# Patient Record
Sex: Female | Born: 1935 | Race: White | Hispanic: No | Smoking: Never smoker
Health system: Southern US, Community
[De-identification: ages and names within clinical notes are randomized; demographics above are authoritative.]

## PROBLEM LIST (undated history)

## (undated) DIAGNOSIS — K635 Polyp of colon: Secondary | ICD-10-CM

## (undated) DIAGNOSIS — H269 Unspecified cataract: Secondary | ICD-10-CM

## (undated) DIAGNOSIS — I10 Essential (primary) hypertension: Secondary | ICD-10-CM

## (undated) DIAGNOSIS — C801 Malignant (primary) neoplasm, unspecified: Secondary | ICD-10-CM

## (undated) DIAGNOSIS — M810 Age-related osteoporosis without current pathological fracture: Secondary | ICD-10-CM

## (undated) DIAGNOSIS — E785 Hyperlipidemia, unspecified: Secondary | ICD-10-CM

## (undated) DIAGNOSIS — D3613 Benign neoplasm of peripheral nerves and autonomic nervous system of lower limb, including hip: Secondary | ICD-10-CM

## (undated) HISTORY — PX: CATARACT EXTRACTION: SUR2

## (undated) HISTORY — DX: Polyp of colon: K63.5

## (undated) HISTORY — DX: Malignant (primary) neoplasm, unspecified: C80.1

## (undated) HISTORY — PX: MELANOMA EXCISION: SHX5266

## (undated) HISTORY — DX: Age-related osteoporosis without current pathological fracture: M81.0

## (undated) HISTORY — DX: Essential (primary) hypertension: I10

## (undated) HISTORY — DX: Benign neoplasm of peripheral nerves and autonomic nervous system of lower limb, including hip: D36.13

## (undated) HISTORY — DX: Hyperlipidemia, unspecified: E78.5

## (undated) HISTORY — DX: Unspecified cataract: H26.9

---

## 1978-01-18 DIAGNOSIS — C801 Malignant (primary) neoplasm, unspecified: Secondary | ICD-10-CM

## 1978-01-18 HISTORY — DX: Malignant (primary) neoplasm, unspecified: C80.1

## 1999-01-08 ENCOUNTER — Other Ambulatory Visit: Admission: RE | Admit: 1999-01-08 | Discharge: 1999-01-08 | Payer: Self-pay | Admitting: Family Medicine

## 1999-05-13 ENCOUNTER — Encounter: Payer: Self-pay | Admitting: Internal Medicine

## 1999-05-13 ENCOUNTER — Observation Stay (HOSPITAL_COMMUNITY): Admission: EM | Admit: 1999-05-13 | Discharge: 1999-05-14 | Payer: Self-pay | Admitting: Internal Medicine

## 2000-02-03 ENCOUNTER — Ambulatory Visit (HOSPITAL_COMMUNITY): Admission: RE | Admit: 2000-02-03 | Discharge: 2000-02-03 | Payer: Self-pay | Admitting: Specialist

## 2000-11-24 ENCOUNTER — Other Ambulatory Visit: Admission: RE | Admit: 2000-11-24 | Discharge: 2000-11-24 | Payer: Self-pay | Admitting: Family Medicine

## 2001-07-03 ENCOUNTER — Ambulatory Visit (HOSPITAL_COMMUNITY): Admission: RE | Admit: 2001-07-03 | Discharge: 2001-07-03 | Payer: Self-pay | Admitting: Specialist

## 2002-06-21 ENCOUNTER — Other Ambulatory Visit: Admission: RE | Admit: 2002-06-21 | Discharge: 2002-06-21 | Payer: Self-pay | Admitting: Family Medicine

## 2002-06-26 ENCOUNTER — Ambulatory Visit (HOSPITAL_COMMUNITY): Admission: RE | Admit: 2002-06-26 | Discharge: 2002-06-26 | Payer: Self-pay

## 2003-01-19 DIAGNOSIS — D3613 Benign neoplasm of peripheral nerves and autonomic nervous system of lower limb, including hip: Secondary | ICD-10-CM

## 2003-01-19 HISTORY — PX: FOOT NEUROMA SURGERY: SHX646

## 2003-01-19 HISTORY — DX: Benign neoplasm of peripheral nerves and autonomic nervous system of lower limb, including hip: D36.13

## 2004-11-25 ENCOUNTER — Other Ambulatory Visit: Admission: RE | Admit: 2004-11-25 | Discharge: 2004-11-25 | Payer: Self-pay | Admitting: Family Medicine

## 2012-05-23 ENCOUNTER — Other Ambulatory Visit (INDEPENDENT_AMBULATORY_CARE_PROVIDER_SITE_OTHER): Payer: Medicare Other

## 2012-05-23 DIAGNOSIS — E785 Hyperlipidemia, unspecified: Secondary | ICD-10-CM

## 2012-05-23 DIAGNOSIS — I10 Essential (primary) hypertension: Secondary | ICD-10-CM

## 2012-05-23 LAB — COMPREHENSIVE METABOLIC PANEL
ALT: 32 U/L (ref 0–35)
BUN: 19 mg/dL (ref 6–23)
CO2: 30 mEq/L (ref 19–32)
Calcium: 9.7 mg/dL (ref 8.4–10.5)
Chloride: 101 mEq/L (ref 96–112)
Creat: 0.97 mg/dL (ref 0.50–1.10)
Glucose, Bld: 84 mg/dL (ref 70–99)
Potassium: 5 mEq/L (ref 3.5–5.3)
Total Bilirubin: 1.5 mg/dL — ABNORMAL HIGH (ref 0.3–1.2)
Total Protein: 6.6 g/dL (ref 6.0–8.3)

## 2012-05-23 NOTE — Progress Notes (Unsigned)
Patient came in for labs only.

## 2012-05-25 LAB — NMR LIPOPROFILE WITH LIPIDS
Cholesterol, Total: 144 mg/dL (ref <200)
HDL Particle Number: 42.4 umol/L (ref >=30.5)
HDL Size: 10.4 nm (ref >=9.2)
HDL-C: 80 mg/dL (ref >=40)
LDL (calc): 50 mg/dL (ref <100)
LDL Particle Number: 347 nmol/L (ref <1000)
LDL Size: 20.9 nm (ref >20.5)
LP-IR Score: <25 (ref <=45)
Large HDL-P: 17.7 umol/L (ref >=4.8)
Large VLDL-P: 1 nmol/L (ref <=2.7)
Small LDL Particle Number: <90 nmol/L (ref <=527)
Triglycerides: 68 mg/dL (ref <150)
VLDL Size: 43.4 nm (ref <=46.6)

## 2012-06-15 ENCOUNTER — Encounter: Payer: Self-pay | Admitting: Gastroenterology

## 2012-06-15 ENCOUNTER — Ambulatory Visit (INDEPENDENT_AMBULATORY_CARE_PROVIDER_SITE_OTHER): Payer: Medicare Other | Admitting: Pharmacist

## 2012-06-15 DIAGNOSIS — M810 Age-related osteoporosis without current pathological fracture: Secondary | ICD-10-CM

## 2012-06-15 MED ORDER — DENOSUMAB 60 MG/ML ~~LOC~~ SOLN
60.0000 mg | Freq: Once | SUBCUTANEOUS | Status: AC
  Administered 2012-06-15: 60 mg via SUBCUTANEOUS

## 2012-06-16 ENCOUNTER — Encounter: Payer: Self-pay | Admitting: *Deleted

## 2012-06-16 ENCOUNTER — Other Ambulatory Visit: Payer: Self-pay | Admitting: *Deleted

## 2012-06-16 DIAGNOSIS — M810 Age-related osteoporosis without current pathological fracture: Secondary | ICD-10-CM

## 2012-06-16 NOTE — Progress Notes (Signed)
[facility]steoporosis Clinic Patient her today for Prolia injection.   Her last dose was 11/2011.  She has tolerated well and reports no know side effects. She is supplementing with calcium and vitamin D in adequate amounts.  Last Serum creatinine and vitamin D was WNL   Next Dexa due  End of 08/2012  Assessment: Osteoporosis  Recommendations: 1.  Prolia 60mg  injection given today.  2.  continue calcium 1200mg  daily either through supplementation   or diet.  .   Time spent counseling patient:  10 minutes

## 2012-09-12 ENCOUNTER — Encounter: Payer: Self-pay | Admitting: Nurse Practitioner

## 2012-09-12 ENCOUNTER — Ambulatory Visit (INDEPENDENT_AMBULATORY_CARE_PROVIDER_SITE_OTHER): Payer: Medicare Other | Admitting: Nurse Practitioner

## 2012-09-12 VITALS — BP 137/73 | HR 63 | Temp 97.0°F | Ht 67.0 in | Wt 133.0 lb

## 2012-09-12 DIAGNOSIS — E785 Hyperlipidemia, unspecified: Secondary | ICD-10-CM

## 2012-09-12 DIAGNOSIS — I10 Essential (primary) hypertension: Secondary | ICD-10-CM

## 2012-09-12 DIAGNOSIS — Z0000 Encounter for general adult medical examination without abnormal findings: Secondary | ICD-10-CM

## 2012-09-12 DIAGNOSIS — Z124 Encounter for screening for malignant neoplasm of cervix: Secondary | ICD-10-CM

## 2012-09-12 DIAGNOSIS — Z01419 Encounter for gynecological examination (general) (routine) without abnormal findings: Secondary | ICD-10-CM

## 2012-09-12 LAB — POCT URINALYSIS DIPSTICK
Glucose, UA: NEGATIVE
Ketones, UA: NEGATIVE
Leukocytes, UA: NEGATIVE
Spec Grav, UA: 1.015
Urobilinogen, UA: NEGATIVE

## 2012-09-12 LAB — POCT CBC
Granulocyte percent: 67.6 %G (ref 37–80)
Lymph, poc: 1.4 (ref 0.6–3.4)
MCHC: 33.5 g/dL (ref 31.8–35.4)
MPV: 8.1 fL (ref 0–99.8)
POC Granulocyte: 3.4 (ref 2–6.9)
POC LYMPH PERCENT: 27.3 %L (ref 10–50)
Platelet Count, POC: 179 10*3/uL (ref 142–424)
RBC: 4.5 M/uL (ref 4.04–5.48)

## 2012-09-12 LAB — POCT UA - MICROSCOPIC ONLY
WBC, Ur, HPF, POC: NEGATIVE
Yeast, UA: NEGATIVE

## 2012-09-12 MED ORDER — ATORVASTATIN CALCIUM 20 MG PO TABS: 20.0000 mg | ORAL_TABLET | Freq: Every day | ORAL | Status: DC

## 2012-09-12 MED ORDER — LISINOPRIL-HYDROCHLOROTHIAZIDE 10-12.5 MG PO TABS: 1.0000 | ORAL_TABLET | Freq: Every day | ORAL | Status: DC

## 2012-09-12 NOTE — Progress Notes (Signed)
Subjective:    Patient ID: Laura Acevedo, female    DOB: 1932-09-05, 79 y.o.   MRN: [id number]  Patient is here for CPE and PAP- Current medical problems Hyperlipidemia This is a chronic problem. The current episode started more than 1 year ago. The problem is controlled. Recent lipid tests were reviewed and are normal. She has no history of diabetes, hypothyroidism, liver disease, obesity or nephrotic syndrome. Factors aggravating her hyperlipidemia include thiazides. Pertinent negatives include no chest pain, focal sensory loss, focal weakness, leg pain, myalgias or shortness of breath. Current antihyperlipidemic treatment includes statins. The current treatment provides moderate improvement of lipids. There are no compliance problems.  Risk factors for coronary artery disease include hypertension and post-menopausal.  Hypertension This is a chronic problem. The current episode started more than 1 year ago. The problem is controlled. Pertinent negatives include no chest pain, orthopnea, palpitations, peripheral edema, shortness of breath or sweats. There are no associated agents to hypertension. Risk factors for coronary artery disease include dyslipidemia and family history. Past treatments include ACE inhibitors and diuretics. The current treatment provides mild improvement. There are no compliance problems.   osteoporosis Prolia every 6 months- doing well no complaints   Review of Systems  Respiratory: Negative for shortness of breath.   Cardiovascular: Negative for chest pain, palpitations and orthopnea.  Musculoskeletal: Negative for myalgias.  Neurological: Negative for focal weakness.  All other systems reviewed and are negative.       Objective:   Physical Exam  Constitutional: She is oriented to person, place, and time. She appears well-developed and well-nourished.  HENT:  Head: Normocephalic.  Right Ear: Hearing, tympanic membrane, external ear and ear canal normal.  Left Ear:  Hearing, tympanic membrane, external ear and ear canal normal.  Nose: Nose normal.  Mouth/Throat: Uvula is midline and oropharynx is clear and moist.  Eyes: Conjunctivae and EOM are normal. Pupils are equal, round, and reactive to light.  Neck: Normal range of motion and full passive range of motion without pain. Neck supple. No JVD present. Carotid bruit is not present. No mass and no thyromegaly present.  Cardiovascular: Normal rate, normal heart sounds and intact distal pulses.   No murmur heard. Pulmonary/Chest: Effort normal and breath sounds normal. Right breast exhibits no inverted nipple, no mass, no nipple discharge, no skin change and no tenderness. Left breast exhibits no inverted nipple, no mass, no nipple discharge, no skin change and no tenderness.  Abdominal: Soft. Bowel sounds are normal. She exhibits no mass. There is no tenderness.  Genitourinary: Vagina normal and uterus normal. No breast swelling, tenderness, discharge or bleeding.  bimanual exam-No adnexal masses or tenderness Cervix parous and pink no discharge   Musculoskeletal: Normal range of motion.  Lymphadenopathy:    She has no cervical adenopathy.  Neurological: She is alert and oriented to person, place, and time.  Skin: Skin is warm and dry.  Psychiatric: She has a normal mood and affect. Her behavior is normal. Judgment and thought content normal.    BP 137/73  Pulse 63  Temp(Src) 97 F (36.1 C) (Oral)  Ht 5\' 7"  (1.702 m)  Wt 133 lb (60.328 kg)  BMI 20.83 kg/m2       Assessment & Plan:  1. Annual physical exam  - POCT urinalysis dipstick - POCT UA - Microscopic Only - POCT CBC - Thyroid Panel With TSH  2. Other and unspecified hyperlipidemia Low fat diet and exercise - NMR, lipoprofile  3. HTN (hypertension) Low  NA+ diet - CMP14+EGFR - lisinopril-hydrochlorothiazide (PRINZIDE,ZESTORETIC) 10-12.5 MG per tablet; Take 1 tablet by mouth daily.  Dispense: 90 tablet; Refill: 1  4.  Hyperlipidemia *Low fat diet and exercsie - atorvastatin (LIPITOR) 20 MG tablet; Take 1 tablet (20 mg total) by mouth daily.  Dispense: 90 tablet; Refill: 1  5. Encounter for routine gynecological examination  - Pap IG (Image Guided)  Continue all meds Labs pending Health Maintenance reviewed Follow up in 6 months  Laura Daphine Deutscher, FNP

## 2012-09-12 NOTE — Patient Instructions (Signed)

## 2012-09-14 LAB — NMR, LIPOPROFILE
Cholesterol: 156 mg/dL (ref <200)
HDL Cholesterol by NMR: 81 mg/dL (ref >=40)
LDL Particle Number: 498 nmol/L (ref <1000)
LDL Size: 20.9 nm (ref >20.5)
LDLC SERPL CALC-MCNC: 55 mg/dL (ref <100)
Triglycerides by NMR: 101 mg/dL (ref <150)

## 2012-09-14 LAB — CMP14+EGFR
AST: 31 IU/L (ref 0–40)
Albumin/Globulin Ratio: 2 (ref 1.1–2.5)
Albumin: 4.5 g/dL (ref 3.5–4.8)
Alkaline Phosphatase: 56 IU/L (ref 39–117)
BUN/Creatinine Ratio: 18 (ref 11–26)
BUN: 18 mg/dL (ref 8–27)
Creatinine, Ser: 0.98 mg/dL (ref 0.57–1.00)
GFR calc Af Amer: 64 mL/min/{1.73_m2} (ref >59)
GFR calc non Af Amer: 55 mL/min/{1.73_m2} — ABNORMAL LOW (ref >59)
Globulin, Total: 2.2 g/dL (ref 1.5–4.5)
Sodium: 141 mmol/L (ref 134–144)
Total Bilirubin: 1.1 mg/dL (ref 0.0–1.2)

## 2012-09-14 LAB — THYROID PANEL WITH TSH
T3 Uptake Ratio: 34 % (ref 24–39)
T4, Total: 7 ug/dL (ref 4.5–12.0)
TSH: 1.41 u[IU]/mL (ref 0.450–4.500)

## 2012-09-15 ENCOUNTER — Telehealth: Payer: Self-pay | Admitting: Nurse Practitioner

## 2012-09-15 NOTE — Telephone Encounter (Signed)
Patient aware.

## 2012-09-16 LAB — PAP IG (IMAGE GUIDED)

## 2012-09-20 ENCOUNTER — Encounter: Payer: Self-pay | Admitting: Pharmacist

## 2012-09-20 ENCOUNTER — Ambulatory Visit (INDEPENDENT_AMBULATORY_CARE_PROVIDER_SITE_OTHER): Payer: Medicare Other

## 2012-09-20 ENCOUNTER — Ambulatory Visit (INDEPENDENT_AMBULATORY_CARE_PROVIDER_SITE_OTHER): Payer: Medicare Other | Admitting: Pharmacist

## 2012-09-20 VITALS — Ht 66.0 in | Wt 134.0 lb

## 2012-09-20 DIAGNOSIS — M810 Age-related osteoporosis without current pathological fracture: Secondary | ICD-10-CM

## 2012-09-20 DIAGNOSIS — E785 Hyperlipidemia, unspecified: Secondary | ICD-10-CM

## 2012-09-20 DIAGNOSIS — I10 Essential (primary) hypertension: Secondary | ICD-10-CM

## 2012-09-20 NOTE — Progress Notes (Signed)
Patient ID: Laura Acevedo, female   DOB: [birth year], 79 y.o.   MRN: [id number] [facility] Current Height: Height: 5\' 6"  (167.6 cm)      Max Lifetime Height:  5' 7.5"  Current Weight: Weight: 134 lb (60.782 kg)       Ethnicity:Caucasian    HPI: Does pt already have a diagnosis of:   Osteoporosis?  Yes  Back Pain?  No       Kyphosis?  No Prior fracture?  No Med(s) for Osteoporosis/Osteopenia:  prolia 60mg  q 6 months - last injection was May 2014.   Due next 11/2012. Med(s) previously tried for Osteoporosis/Osteopenia:  Fosamax - stopped because had taken for 12 years.                                                             PMH: Age at menopause:  83's Hysterectomy?  No Oophorectomy?  No HRT? No Steroid Use?  No Thyroid med?  No History of cancer?  Yes - melanoma / skin cancer History of digestive disorders (ie Crohn's)?  No Current or previous eating disorders?  No Last Vitamin D Result:  49 (02/2011) Last GFR Result:  55 (09/15/2012)   FH/SH: Family history of osteoporosis?  No Parent with history of hip fracture?  No Family history of breast cancer?  No Exercise?  No - active lifestyle / regular gardening Smoking?  No Alcohol?  No    Calcium Assessment Calcium Intake  # of servings/day  Calcium mg  Milk (8 oz) 3  x  300  = 900mg   Yogurt (4 oz) 0 x  200 = 0  Cheese (1 oz) 0 x  200 = 0  Other Calcium sources   250mg   Ca supplement mvi daily and Calcium daily = 1000mg    Estimated calcium intake per day 2150mg     DEXA Results Date of Test T-Score for AP Spine L1-L4 T-Score for Total Left Hip T-Score for Total Right Hip  09/20/2012 -2.6 -2.1 -1.9  09/09/2010 -3.0 -2.5 -2.2  03/08/2007 -3.1 -2.2 -2.0  12/28/2004 -3.1 -2.3 -2.1   Assessment: Osteoporosis with improved BMD  Recom mendations: 1.  Continue prolia 60mg  q 6 months 2.  recommend calcium 1200mg  daily through supplementation or diet.  3.  continue weight bearing exercise - 30 minutes at least  4 days  per week.   4.  Counseled and educated about fall risk and prevention.  Recheck DEXA:  2 years  Time spent counseling patient:  30 minutes

## 2012-09-21 LAB — VITAMIN D 25 HYDROXY (VIT D DEFICIENCY, FRACTURES): Vit D, 25-Hydroxy: 49.4 ng/mL (ref 30.0–100.0)

## 2012-09-25 ENCOUNTER — Telehealth: Payer: Self-pay | Admitting: Pharmacist

## 2012-09-25 NOTE — Telephone Encounter (Signed)
Vitamin D WNL.  Recheck in 1 year.

## 2012-09-27 NOTE — Telephone Encounter (Signed)
Patient notified of lab results

## 2012-10-03 ENCOUNTER — Encounter: Payer: Self-pay | Admitting: Gastroenterology

## 2012-11-22 ENCOUNTER — Ambulatory Visit (INDEPENDENT_AMBULATORY_CARE_PROVIDER_SITE_OTHER): Payer: Medicare Other

## 2012-11-22 DIAGNOSIS — Z23 Encounter for immunization: Secondary | ICD-10-CM

## 2012-12-04 ENCOUNTER — Telehealth: Payer: Self-pay | Admitting: Pharmacist

## 2012-12-04 NOTE — Telephone Encounter (Signed)
appt made for prolia injection for 12/25/2012.  (patient is having colonoscopy the week prior and wanted to wait for Prolia until after colonoscopy)

## 2012-12-07 ENCOUNTER — Ambulatory Visit (AMBULATORY_SURGERY_CENTER): Payer: Self-pay | Admitting: *Deleted

## 2012-12-07 VITALS — Ht 66.5 in | Wt 133.2 lb

## 2012-12-07 DIAGNOSIS — Z1211 Encounter for screening for malignant neoplasm of colon: Secondary | ICD-10-CM

## 2012-12-07 MED ORDER — NA SULFATE-K SULFATE-MG SULF 17.5-3.13-1.6 GM/177ML PO SOLN: 1.0000 | Freq: Once | ORAL | Status: DC

## 2012-12-07 NOTE — Progress Notes (Signed)
No allergies to eggs or soy. No problems with anesthesia.  

## 2012-12-13 ENCOUNTER — Encounter: Payer: Self-pay | Admitting: Gastroenterology

## 2012-12-18 ENCOUNTER — Ambulatory Visit: Payer: Self-pay

## 2012-12-20 ENCOUNTER — Ambulatory Visit (AMBULATORY_SURGERY_CENTER): Payer: Medicare Other | Admitting: Gastroenterology

## 2012-12-20 ENCOUNTER — Encounter: Payer: Self-pay | Admitting: Gastroenterology

## 2012-12-20 VITALS — BP 120/59 | HR 76 | Temp 96.6°F | Resp 13 | Ht 66.5 in | Wt 133.0 lb

## 2012-12-20 DIAGNOSIS — Z1211 Encounter for screening for malignant neoplasm of colon: Secondary | ICD-10-CM

## 2012-12-20 DIAGNOSIS — D126 Benign neoplasm of colon, unspecified: Secondary | ICD-10-CM

## 2012-12-20 DIAGNOSIS — K5730 Diverticulosis of large intestine without perforation or abscess without bleeding: Secondary | ICD-10-CM

## 2012-12-20 DIAGNOSIS — K648 Other hemorrhoids: Secondary | ICD-10-CM

## 2012-12-20 MED ORDER — SODIUM CHLORIDE 0.9 % IV SOLN: 500.0000 mL | INTRAVENOUS | Status: DC

## 2012-12-20 NOTE — Progress Notes (Signed)
Procedure ends, to recovery, report given and VSS. 

## 2012-12-20 NOTE — Op Note (Signed)
[facility] [address] [place], [address]   COLONOSCOPY PROCEDURE REPORT  PATIENT: Laura Acevedo, Laura Acevedo  MR#: [id number] BIRTHDATE: 1932-02-15 , 80  yrs. old GENDER: Female ENDOSCOPIST: Louis Meckel, MD REFERRED [id number] Christell Constant, M.D. PROCEDURE DATE:  12/20/2012 PROCEDURE:   Colonoscopy with snare polypectomy First Screening Colonoscopy - Avg.  risk and is 50 yrs.  old or older - No.  Prior Negative Screening - Now for repeat screening. 10 or more years since last screening  History of Adenoma - Now for follow-up colonoscopy & has been > or = to 3 yrs.  N/A  Polyps Removed Today? Yes. ASA CLASS:   Class II INDICATIONS:Average risk patient for colon cancer. MEDICATIONS: MAC sedation, administered by CRNA and propofol (Diprivan) 200mg  IV  DESCRIPTION OF PROCEDURE:   After the risks benefits and alternatives of the procedure were thoroughly explained, informed consent was obtained.  A digital rectal exam revealed no abnormalities of the rectum.   The LB [id number]  endoscope was introduced through the anus and advanced to the cecum, which was identified by both the appendix and ileocecal valve. No adverse events experienced.   The quality of the prep was excellent using Suprep  The instrument was then slowly withdrawn as the colon was fully examined.      COLON FINDINGS: A flat polyp was found in the ascending colon.  A polypectomy was performed with a cold snare.  The resection was complete and the polyp tissue was completely retrieved.   Mild diverticulosis was noted in the sigmoid colon.   Internal hemorrhoids were found.   The colon was otherwise normal.  There was no diverticulosis, inflammation, polyps or cancers unless previously stated.  Retroflexed views revealed no abnormalities. The time to cecum=4 minutes 15 seconds.  Withdrawal time=10 minutes 43 seconds.  The scope was withdrawn and the procedure completed. COMPLICATIONS: There were no  complications.  ENDOSCOPIC IMPRESSION: 1.   Flat polyp was found in the ascending colon; polypectomy was performed with a cold snare 2.   Mild diverticulosis was noted in the sigmoid colon 3.   Internal hemorrhoids 4.   The colon was otherwise normal  RECOMMENDATIONS: Given your age, you will not need another colonoscopy for colon cancer screening or polyp surveillance.  These types of tests usually stop around the age 30.   eSigned:  Louis Meckel, MD 12/20/2012 10:53 AM   cc:   PATIENT NAME:  Laura Acevedo, Laura Acevedo MR#: [id number]

## 2012-12-20 NOTE — Patient Instructions (Signed)
Discharge instructions given with verbal understanding. Handouts on polyps,diverticulosis and hemorrhoids. Resume previous medications. YOU HAD AN ENDOSCOPIC PROCEDURE TODAY AT THE  ENDOSCOPY CENTER: Refer to the procedure report that was given to you for any specific questions about what was found during the examination.  If the procedure report does not answer your questions, please call your gastroenterologist to clarify.  If you requested that your care partner not be given the details of your procedure findings, then the procedure report has been included in a sealed envelope for you to review at your convenience later.  YOU SHOULD EXPECT: Some feelings of bloating in the abdomen. Passage of more gas than usual.  Walking can help get rid of the air that was put into your GI tract during the procedure and reduce the bloating. If you had a lower endoscopy (such as a colonoscopy or flexible sigmoidoscopy) you may notice spotting of blood in your stool or on the toilet paper. If you underwent a bowel prep for your procedure, then you may not have a normal bowel movement for a few days.  DIET: Your first meal following the procedure should be a light meal and then it is ok to progress to your normal diet.  A half-sandwich or bowl of soup is an example of a good first meal.  Heavy or fried foods are harder to digest and may make you feel nauseous or bloated.  Likewise meals heavy in dairy and vegetables can cause extra gas to form and this can also increase the bloating.  Drink plenty of fluids but you should avoid alcoholic beverages for 24 hours.  ACTIVITY: Your care partner should take you home directly after the procedure.  You should plan to take it easy, moving slowly for the rest of the day.  You can resume normal activity the day after the procedure however you should NOT DRIVE or use heavy machinery for 24 hours (because of the sedation medicines used during the test).    SYMPTOMS TO REPORT  IMMEDIATELY: A gastroenterologist can be reached at any hour.  During normal business hours, 8:30 AM to 5:00 PM Monday through Friday, call [phone].  After hours and on weekends, please call the GI answering service at [phone] who will take a message and have the physician on call contact you.   Following lower endoscopy (colonoscopy or flexible sigmoidoscopy):  Excessive amounts of blood in the stool  Significant tenderness or worsening of abdominal pains  Swelling of the abdomen that is new, acute  Fever of 100F or higher  FOLLOW UP: If any biopsies were taken you will be contacted by phone or by letter within the next 1-3 weeks.  Call your gastroenterologist if you have not heard about the biopsies in 3 weeks.  Our staff will call the home number listed on your records the next business day following your procedure to check on you and address any questions or concerns that you may have at that time regarding the information given to you following your procedure. This is a courtesy call and so if there is no answer at the home number and we have not heard from you through the emergency physician on call, we will assume that you have returned to your regular daily activities without incident.  SIGNATURES/CONFIDENTIALITY: You and/or your care partner have signed paperwork which will be entered into your electronic medical record.  These signatures attest to the fact that that the information above on your After Visit Summary   has been reviewed and is understood.  Full responsibility of the confidentiality of this discharge information lies with you and/or your care-partner. 

## 2012-12-20 NOTE — Progress Notes (Signed)
Called to room to assist during endoscopic procedure.  Patient ID and intended procedure confirmed with present staff. Received instructions for my participation in the procedure from the performing physician. ewm 

## 2012-12-20 NOTE — Progress Notes (Signed)
Patient did not experience any of the following events: a burn prior to discharge; a fall within the facility; wrong site/side/patient/procedure/implant event; or a hospital transfer or hospital admission upon discharge from the facility. (G8907) Patient did not have preoperative order for IV antibiotic SSI prophylaxis. (G8918)  

## 2012-12-21 ENCOUNTER — Telehealth: Payer: Self-pay | Admitting: *Deleted

## 2012-12-21 NOTE — Telephone Encounter (Signed)
  Follow up Call-  Call back number 12/20/2012  Post procedure Call Back phone  # [phone]  Permission to leave phone message Yes     Patient questions:  Do you have a fever, pain , or abdominal swelling? no Pain Score  0 *  Have you tolerated food without any problems? yes  Have you been able to return to your normal activities? yes  Do you have any questions about your discharge instructions: Diet   no Medications  no Follow up visit  no  Do you have questions or concerns about your Care? no  Actions: * If pain score is 4 or above: No action needed, pain <4.

## 2012-12-25 ENCOUNTER — Ambulatory Visit (INDEPENDENT_AMBULATORY_CARE_PROVIDER_SITE_OTHER): Payer: Medicare Other | Admitting: Pharmacist

## 2012-12-25 DIAGNOSIS — M810 Age-related osteoporosis without current pathological fracture: Secondary | ICD-10-CM

## 2012-12-25 MED ORDER — DENOSUMAB 60 MG/ML ~~LOC~~ SOLN
60.0000 mg | Freq: Once | SUBCUTANEOUS | Status: AC
  Administered 2012-12-25: 60 mg via SUBCUTANEOUS

## 2012-12-25 NOTE — Progress Notes (Signed)
Patient ID: Laura Acevedo, female   DOB: Oct 04, 1932, 79 y.o.   MRN: [id number]  Osteoporosis Clinic Patient has diagnosis of osteoporosis  Patient her today for Prolia injection.   Her last dose was 05/2012.  She has tolerated well and reports no know side effects. She is supplementing with calcium and vitamin D in adequate amounts.  Last Serum creatinine and vitamin D was WNL   Next Dexa due 2016  Assessment: Osteoporosis  Recommendations: 1.  Prolia 60mg  injection given today.  (patient paid $170.00) 2.  continue calcium 1200mg  daily either through supplementation   or diet.  .   Time spent counseling patient:  10 minutes

## 2012-12-26 ENCOUNTER — Encounter: Payer: Self-pay | Admitting: Gastroenterology

## 2013-01-19 ENCOUNTER — Telehealth: Payer: Self-pay | Admitting: Nurse Practitioner

## 2013-01-19 NOTE — Telephone Encounter (Signed)
Pt called back and said she spoke with someone else and no longer wants [place] to call her.

## 2013-03-20 ENCOUNTER — Other Ambulatory Visit: Payer: Medicare Other

## 2013-03-20 ENCOUNTER — Ambulatory Visit (INDEPENDENT_AMBULATORY_CARE_PROVIDER_SITE_OTHER): Payer: Medicare Other | Admitting: Nurse Practitioner

## 2013-03-20 ENCOUNTER — Ambulatory Visit (INDEPENDENT_AMBULATORY_CARE_PROVIDER_SITE_OTHER): Payer: Medicare Other

## 2013-03-20 VITALS — BP 136/78 | HR 70 | Temp 96.7°F | Ht 66.0 in | Wt 126.0 lb

## 2013-03-20 DIAGNOSIS — R5383 Other fatigue: Secondary | ICD-10-CM

## 2013-03-20 DIAGNOSIS — R5381 Other malaise: Secondary | ICD-10-CM

## 2013-03-20 DIAGNOSIS — R634 Abnormal weight loss: Secondary | ICD-10-CM

## 2013-03-20 NOTE — Patient Instructions (Signed)

## 2013-03-20 NOTE — Progress Notes (Signed)
   Subjective:    Patient ID: Laura Acevedo, female    DOB: 1932-12-19, 80 y.o.   MRN: [id number]  HPI Patient is here today complaining of weight loss and fatigue. Patient has noticed weight loss over last few years. Patient weighs herself daily and states she is losing at least a pound a day. Diet consists of 3 full meals a day with snacks in between. She has also started drinking 1 bottle of Ensure daily. Recently had the flu in January and has not felt as though she has regained her energy. Is able to do household activities but has to "push" to accomplish tasks. Still enjoys activities as she once did. Patient's weight was 133 in August of 2014 and is 126 today.   Review of Systems  Constitutional: Positive for appetite change (Decreased), fatigue and unexpected weight change.  Respiratory: Negative for shortness of breath.   Cardiovascular: Positive for palpitations. Negative for chest pain.  Gastrointestinal: Negative for nausea, diarrhea and constipation.  Endocrine: Positive for cold intolerance.  Neurological: Positive for weakness. Negative for dizziness.  Psychiatric/Behavioral: Negative for sleep disturbance.  All other systems reviewed and are negative.       Objective:   Physical Exam  Constitutional: She is oriented to person, place, and time. She appears well-developed and well-nourished.  HENT:  Nose: Nose normal.  Mouth/Throat: Oropharynx is clear and moist.  Eyes: EOM are normal.  Neck: Trachea normal, normal range of motion and full passive range of motion without pain. Neck supple. No JVD present. Carotid bruit is not present. No thyromegaly present.  Cardiovascular: Normal rate, regular rhythm, normal heart sounds and intact distal pulses.  Exam reveals no gallop and no friction rub.   No murmur heard. Pulmonary/Chest: Effort normal and breath sounds normal.  Abdominal: Soft. Bowel sounds are normal. She exhibits no distension and no mass. There is no tenderness.    Musculoskeletal: Normal range of motion.  Lymphadenopathy:    She has no cervical adenopathy.  Neurological: She is alert and oriented to person, place, and time. She has normal reflexes.  Skin: Skin is warm and dry.  Psychiatric: She has a normal mood and affect. Her behavior is normal. Judgment and thought content normal.    BP 136/78  Pulse 70  Temp(Src) 96.7 F (35.9 C) (Oral)  Ht $R'5\' 6"'gC$  (1.676 m)  Wt 126 lb (57.153 kg)  BMI 20.35 kg/m2  Chest X-Ray: Normal Preliminary reading by Ronnald Collum, FNP  [facility]   EKG: NSR Preliminary reading by Ronnald Collum, FNP  [facility]      Assessment & Plan:   1. Loss of weight   2. Other malaise and fatigue    Orders Placed This Encounter  Procedures  . DG Chest 2 View    Standing Status: Future     Number of Occurrences: 1     Standing Expiration Date: 05/20/2014    Order Specific Question:  Reason for Exam (SYMPTOM  OR DIAGNOSIS REQUIRED)    Answer:  weight loss    Order Specific Question:  Preferred imaging location?    Answer:  Internal  . CMP14+EGFR  . Thyroid Panel With TSH  . Anemia Profile B  . EKG 12-Lead   Drink 2 Ensures daily Keep weight diary F/U in 1 month  [place], FNP

## 2013-03-21 LAB — THYROID PANEL WITH TSH
FREE THYROXINE INDEX: 2.7 (ref 1.2–4.9)
T3 Uptake Ratio: 34 % (ref 24–39)
T4, Total: 7.8 ug/dL (ref 4.5–12.0)
TSH: 1.12 u[IU]/mL (ref 0.450–4.500)

## 2013-03-21 LAB — ANEMIA PROFILE B
BASOS ABS: 0 10*3/uL (ref 0.0–0.2)
Basos: 0 %
EOS ABS: 0 10*3/uL (ref 0.0–0.4)
Eos: 0 %
FERRITIN: 145 ng/mL (ref 15–150)
Folate: >19.9 ng/mL (ref >3.0)
HEMATOCRIT: 42.3 % (ref 34.0–46.6)
Hemoglobin: 14.2 g/dL (ref 11.1–15.9)
IRON SATURATION: 33 % (ref 15–55)
Immature Grans (Abs): 0 10*3/uL (ref 0.0–0.1)
Immature Granulocytes: 0 %
Iron: 97 ug/dL (ref 35–155)
LYMPHS ABS: 1.3 10*3/uL (ref 0.7–3.1)
LYMPHS: 15 %
MCH: 31.8 pg (ref 26.6–33.0)
MCHC: 33.6 g/dL (ref 31.5–35.7)
MCV: 95 fL (ref 79–97)
Monocytes Absolute: 0.7 10*3/uL (ref 0.1–0.9)
Monocytes: 9 %
NEUTROS ABS: 6.5 10*3/uL (ref 1.4–7.0)
Neutrophils Relative %: 76 %
Platelets: 235 10*3/uL (ref 150–379)
RBC: 4.47 x10E6/uL (ref 3.77–5.28)
RDW: 13.7 % (ref 12.3–15.4)
Retic Ct Pct: 0.9 % (ref 0.6–2.6)
TIBC: 294 ug/dL (ref 250–450)
UIBC: 197 ug/dL (ref 150–375)
Vitamin B-12: 492 pg/mL (ref 211–946)
WBC: 8.5 10*3/uL (ref 3.4–10.8)

## 2013-03-21 LAB — CMP14+EGFR
A/G RATIO: 1.9 (ref 1.1–2.5)
ALT: 38 IU/L — ABNORMAL HIGH (ref 0–32)
AST: 31 IU/L (ref 0–40)
Albumin: 4.5 g/dL (ref 3.5–4.8)
Alkaline Phosphatase: 73 IU/L (ref 39–117)
BILIRUBIN TOTAL: 1 mg/dL (ref 0.0–1.2)
BUN/Creatinine Ratio: 21 (ref 11–26)
BUN: 20 mg/dL (ref 8–27)
CO2: 27 mmol/L (ref 18–29)
Calcium: 10.5 mg/dL — ABNORMAL HIGH (ref 8.7–10.3)
Chloride: 97 mmol/L (ref 97–108)
Creatinine, Ser: 0.97 mg/dL (ref 0.57–1.00)
GFR calc Af Amer: 65 mL/min/{1.73_m2} (ref >59)
GFR, EST NON AFRICAN AMERICAN: 56 mL/min/{1.73_m2} — AB (ref >59)
GLUCOSE: 119 mg/dL — AB (ref 65–99)
Globulin, Total: 2.4 g/dL (ref 1.5–4.5)
Potassium: 4.9 mmol/L (ref 3.5–5.2)
SODIUM: 141 mmol/L (ref 134–144)
TOTAL PROTEIN: 6.9 g/dL (ref 6.0–8.5)

## 2013-04-23 ENCOUNTER — Ambulatory Visit (INDEPENDENT_AMBULATORY_CARE_PROVIDER_SITE_OTHER): Payer: Medicare Other | Admitting: Nurse Practitioner

## 2013-04-23 ENCOUNTER — Encounter: Payer: Self-pay | Admitting: Nurse Practitioner

## 2013-04-23 VITALS — BP 132/69 | HR 75 | Temp 96.6°F | Ht 66.0 in | Wt 129.0 lb

## 2013-04-23 DIAGNOSIS — M810 Age-related osteoporosis without current pathological fracture: Secondary | ICD-10-CM

## 2013-04-23 DIAGNOSIS — E785 Hyperlipidemia, unspecified: Secondary | ICD-10-CM

## 2013-04-23 DIAGNOSIS — I10 Essential (primary) hypertension: Secondary | ICD-10-CM

## 2013-04-23 MED ORDER — ATORVASTATIN CALCIUM 20 MG PO TABS: 20.0000 mg | ORAL_TABLET | Freq: Every day | ORAL | Status: DC

## 2013-04-23 MED ORDER — LISINOPRIL-HYDROCHLOROTHIAZIDE 10-12.5 MG PO TABS: 1.0000 | ORAL_TABLET | Freq: Every day | ORAL | Status: DC

## 2013-04-23 NOTE — Patient Instructions (Signed)

## 2013-04-23 NOTE — Progress Notes (Signed)
Subjective:    Patient ID: Laura Acevedo, female    DOB: 1932-09-08, 80 y.o.   MRN: [id number] [facility]- Was seen several weeks ago with a concern about weight loss- That has improved and she has actually gained 3 lbs since last visit- Drinking ensure daily- No change in bowel habits- no abdominal pain- no SOB.   Hyperlipidemia This is a chronic problem. The current episode started more than 1 year ago. The problem is controlled. Recent lipid tests were reviewed and are normal. She has no history of diabetes, hypothyroidism, liver disease, obesity or nephrotic syndrome. Factors aggravating her hyperlipidemia include thiazides. Pertinent negatives include no chest pain, focal sensory loss, focal weakness, leg pain, myalgias or shortness of breath. Current antihyperlipidemic treatment includes statins. The current treatment provides moderate improvement of lipids. There are no compliance problems.  Risk factors for coronary artery disease include hypertension and post-menopausal.  Hypertension This is a chronic problem. The current episode started more than 1 year ago. The problem is controlled. Pertinent negatives include no chest pain, orthopnea, palpitations, peripheral edema, shortness of breath or sweats. There are no associated agents to hypertension. Risk factors for coronary artery disease include dyslipidemia and family history. Past treatments include ACE inhibitors and diuretics. The current treatment provides mild improvement. There are no compliance problems.   osteoporosis Prolia every 6 months- doing well no complaints   Review of Systems  Constitutional: Negative for appetite change and unexpected weight change.  HENT: Negative.   Eyes: Negative.   Respiratory: Negative for shortness of breath.   Cardiovascular: Negative for chest pain, palpitations and orthopnea.  Genitourinary: Negative.   Musculoskeletal: Negative.  Negative for  myalgias.  Neurological: Negative.  Negative for focal weakness.  Psychiatric/Behavioral: Negative.   All other systems reviewed and are negative.       Objective:   Physical Exam  Constitutional: She is oriented to person, place, and time. She appears well-developed and well-nourished.  HENT:  Head: Normocephalic.  Right Ear: Hearing, tympanic membrane, external ear and ear canal normal.  Left Ear: Hearing, tympanic membrane, external ear and ear canal normal.  Nose: Nose normal.  Mouth/Throat: Uvula is midline and oropharynx is clear and moist.  Eyes: Conjunctivae and EOM are normal. Pupils are equal, round, and reactive to light.  Neck: Normal range of motion and full passive range of motion without pain. Neck supple. No JVD present. Carotid bruit is not present. No mass and no thyromegaly present.  Cardiovascular: Normal rate, normal heart sounds and intact distal pulses.   No murmur heard. Pulmonary/Chest: Effort normal and breath sounds normal. Right breast exhibits no inverted nipple, no mass, no nipple discharge, no skin change and no tenderness. Left breast exhibits no inverted nipple, no mass, no nipple discharge, no skin change and no tenderness.  Abdominal: Soft. Bowel sounds are normal. She exhibits no mass. There is no tenderness.  Genitourinary: No breast swelling, tenderness, discharge or bleeding.  Musculoskeletal: Normal range of motion.  Lymphadenopathy:    She has no cervical adenopathy.  Neurological: She is alert and oriented to person, place, and time.  Skin: Skin is warm and dry.  Psychiatric: She has a normal mood and affect. Her behavior is normal. Judgment and thought content normal.    BP 132/69  Pulse 75  Temp(Src) 96.6 F (35.9 C) (Oral)  Ht 5' 6"  (1.676 m)  Wt 129 lb (58.514 kg)  BMI 20.83 kg/m2  Assessment & Plan:   1. Osteoporosis   2. Hyperlipidemia   3. HTN (hypertension)    Orders Placed This Encounter  Procedures  .  CMP14+EGFR  . NMR, lipoprofile   Meds ordered this encounter  Medications  . lisinopril-hydrochlorothiazide (PRINZIDE,ZESTORETIC) 10-12.5 MG per tablet    Sig: Take 1 tablet by mouth daily.    Dispense:  90 tablet    Refill:  1    Order Specific Question:  Supervising Provider    Answer:  Chipper Herb [[id number]]  . atorvastatin (LIPITOR) 20 MG tablet    Sig: Take 1 tablet (20 mg total) by mouth daily.    Dispense:  90 tablet    Refill:  1    Order Specific Question:  Supervising Provider    Answer:  Chipper Herb [[id number]]    Labs pending Health maintenance reviewed Diet and exercise encouraged Continue all meds Follow up  In 3 month   [place], FNP

## 2013-04-25 LAB — CMP14+EGFR
A/G RATIO: 2 (ref 1.1–2.5)
ALT: 31 IU/L (ref 0–32)
AST: 25 IU/L (ref 0–40)
Albumin: 4.3 g/dL (ref 3.5–4.8)
Alkaline Phosphatase: 62 IU/L (ref 39–117)
BILIRUBIN TOTAL: 0.7 mg/dL (ref 0.0–1.2)
BUN/Creatinine Ratio: 25 (ref 11–26)
BUN: 26 mg/dL (ref 8–27)
CO2: 28 mmol/L (ref 18–29)
Calcium: 10.2 mg/dL (ref 8.7–10.3)
Chloride: 97 mmol/L (ref 97–108)
Creatinine, Ser: 1.06 mg/dL — ABNORMAL HIGH (ref 0.57–1.00)
GFR, EST AFRICAN AMERICAN: 58 mL/min/{1.73_m2} — AB (ref >59)
GFR, EST NON AFRICAN AMERICAN: 50 mL/min/{1.73_m2} — AB (ref >59)
Globulin, Total: 2.1 g/dL (ref 1.5–4.5)
Glucose: 133 mg/dL — ABNORMAL HIGH (ref 65–99)
POTASSIUM: 4 mmol/L (ref 3.5–5.2)
SODIUM: 140 mmol/L (ref 134–144)
Total Protein: 6.4 g/dL (ref 6.0–8.5)

## 2013-04-25 LAB — NMR, LIPOPROFILE
Cholesterol: 151 mg/dL (ref <200)
HDL Cholesterol by NMR: 80 mg/dL (ref >=40)
HDL Particle Number: 46.4 umol/L (ref >=30.5)
LDL Particle Number: 599 nmol/L (ref <1000)
LDL Size: 21.1 nm (ref >20.5)
LDLC SERPL CALC-MCNC: 54 mg/dL (ref <100)
LP-IR Score: <25 (ref <=45)
TRIGLYCERIDES BY NMR: 84 mg/dL (ref <150)

## 2013-05-21 ENCOUNTER — Encounter: Payer: Self-pay | Admitting: *Deleted

## 2013-07-25 ENCOUNTER — Ambulatory Visit: Payer: Medicare Other | Admitting: Nurse Practitioner

## 2013-07-26 ENCOUNTER — Encounter: Payer: Self-pay | Admitting: Nurse Practitioner

## 2013-07-26 ENCOUNTER — Ambulatory Visit (INDEPENDENT_AMBULATORY_CARE_PROVIDER_SITE_OTHER): Payer: Medicare Other | Admitting: Nurse Practitioner

## 2013-07-26 VITALS — BP 138/74 | HR 72 | Temp 98.6°F | Ht 66.0 in | Wt 130.8 lb

## 2013-07-26 DIAGNOSIS — I10 Essential (primary) hypertension: Secondary | ICD-10-CM

## 2013-07-26 DIAGNOSIS — M810 Age-related osteoporosis without current pathological fracture: Secondary | ICD-10-CM

## 2013-07-26 DIAGNOSIS — E785 Hyperlipidemia, unspecified: Secondary | ICD-10-CM

## 2013-07-26 MED ORDER — DENOSUMAB 60 MG/ML ~~LOC~~ SOLN
60.0000 mg | Freq: Once | SUBCUTANEOUS | Status: AC
  Administered 2013-07-26: 60 mg via SUBCUTANEOUS

## 2013-07-26 NOTE — Progress Notes (Signed)
Subjective:    Patient ID: Laura Acevedo, female    DOB: Jul 16, 1932, 80 y.o.   MRN: [id number]  Patient here today for follow up of chronic medical problems. SHe was concerned at last visit with her weight loss- She has actually gained a few pounds this time. She is doin well today without complaints.   Hypertension This is a chronic problem. The current episode started more than 1 year ago. The problem is controlled. Pertinent negatives include no chest pain, orthopnea, palpitations, peripheral edema, shortness of breath or sweats. There are no associated agents to hypertension. Risk factors for coronary artery disease include dyslipidemia and family history. Past treatments include ACE inhibitors and diuretics. The current treatment provides mild improvement. There are no compliance problems.   Hyperlipidemia This is a chronic problem. The current episode started more than 1 year ago. The problem is controlled. Recent lipid tests were reviewed and are normal. She has no history of diabetes, hypothyroidism, liver disease, obesity or nephrotic syndrome. Factors aggravating her hyperlipidemia include thiazides. Pertinent negatives include no chest pain, focal sensory loss, focal weakness, leg pain, myalgias or shortness of breath. Current antihyperlipidemic treatment includes statins. The current treatment provides moderate improvement of lipids. There are no compliance problems.  Risk factors for coronary artery disease include hypertension and post-menopausal.  osteoporosis Prolia every 6 months- doing well no complaints   Review of Systems  Constitutional: Negative for appetite change and unexpected weight change.  HENT: Negative.   Eyes: Negative.   Respiratory: Negative for shortness of breath.   Cardiovascular: Negative for chest pain, palpitations and orthopnea.  Genitourinary: Negative.   Musculoskeletal: Negative.  Negative for myalgias.  Neurological: Negative.  Negative for focal  weakness.  Psychiatric/Behavioral: Negative.   All other systems reviewed and are negative.      Objective:   Physical Exam  Constitutional: She is oriented to person, place, and time. She appears well-developed and well-nourished.  HENT:  Head: Normocephalic.  Right Ear: Hearing, tympanic membrane, external ear and ear canal normal.  Left Ear: Hearing, tympanic membrane, external ear and ear canal normal.  Nose: Nose normal.  Mouth/Throat: Uvula is midline and oropharynx is clear and moist.  Eyes: Conjunctivae and EOM are normal. Pupils are equal, round, and reactive to light.  Neck: Normal range of motion and full passive range of motion without pain. Neck supple. No JVD present. Carotid bruit is not present. No mass and no thyromegaly present.  Cardiovascular: Normal rate, normal heart sounds and intact distal pulses.   No murmur heard. Pulmonary/Chest: Effort normal and breath sounds normal. Right breast exhibits no inverted nipple, no mass, no nipple discharge, no skin change and no tenderness. Left breast exhibits no inverted nipple, no mass, no nipple discharge, no skin change and no tenderness.  Abdominal: Soft. Bowel sounds are normal. She exhibits no mass. There is no tenderness.  Genitourinary: No breast swelling, tenderness, discharge or bleeding.  Musculoskeletal: Normal range of motion.  Lymphadenopathy:    She has no cervical adenopathy.  Neurological: She is alert and oriented to person, place, and time.  Skin: Skin is warm and dry.  Psychiatric: She has a normal mood and affect. Her behavior is normal. Judgment and thought content normal.    BP 138/74  Pulse 72  Temp(Src) 98.6 F (37 C) (Oral)  Ht _0  (1.676 m)  Wt 130 lb 12.8 oz (59.33 kg)  BMI 21.12 kg/m2       Assessment & Plan:   1.  Osteoporosis   2. Hyperlipidemia   3. Essential hypertension, benign    Orders Placed This Encounter  Procedures  . CMP14+EGFR  . NMR, lipoprofile    Labs  pending Health maintenance reviewed Diet and exercise encouraged Continue all meds Follow up  In 3 months   [place], FNP

## 2013-07-26 NOTE — Patient Instructions (Signed)

## 2013-07-27 ENCOUNTER — Telehealth: Payer: Self-pay | Admitting: Family Medicine

## 2013-07-27 LAB — CMP14+EGFR
ALBUMIN: 4.6 g/dL (ref 3.5–4.8)
ALK PHOS: 57 IU/L (ref 39–117)
ALT: 42 IU/L — AB (ref 0–32)
AST: 37 IU/L (ref 0–40)
Albumin/Globulin Ratio: 2.3 (ref 1.1–2.5)
BUN / CREAT RATIO: 18 (ref 11–26)
BUN: 18 mg/dL (ref 8–27)
CHLORIDE: 98 mmol/L (ref 97–108)
CO2: 28 mmol/L (ref 18–29)
Calcium: 10.4 mg/dL — ABNORMAL HIGH (ref 8.7–10.3)
Creatinine, Ser: 1 mg/dL (ref 0.57–1.00)
GFR calc Af Amer: 62 mL/min/{1.73_m2} (ref >59)
GFR calc non Af Amer: 54 mL/min/{1.73_m2} — ABNORMAL LOW (ref >59)
GLUCOSE: 99 mg/dL (ref 65–99)
Globulin, Total: 2 g/dL (ref 1.5–4.5)
Potassium: 4.9 mmol/L (ref 3.5–5.2)
Sodium: 140 mmol/L (ref 134–144)
Total Bilirubin: 1.2 mg/dL (ref 0.0–1.2)
Total Protein: 6.6 g/dL (ref 6.0–8.5)

## 2013-07-27 LAB — NMR, LIPOPROFILE
CHOLESTEROL: 158 mg/dL (ref 100–199)
HDL Cholesterol by NMR: 102 mg/dL (ref >39)
HDL Particle Number: 41.9 umol/L (ref >=30.5)
LDL Particle Number: 445 nmol/L (ref <1000)
LDL Size: 20.9 nm (ref >20.5)
LDLC SERPL CALC-MCNC: 42 mg/dL (ref 0–99)
LP-IR SCORE: 26 (ref <=45)
Small LDL Particle Number: <90 nmol/L (ref <=527)
Triglycerides by NMR: 68 mg/dL (ref 0–149)

## 2013-07-27 NOTE — Telephone Encounter (Signed)
Message copied by Waverly Ferrari on Fri Jul 27, 2013  3:56 PM ------      Message from: Chevis Pretty      Created: Fri Jul 27, 2013  2:52 PM       Kidney and liver function stable      Cholesterol looks great      Continue current meds- low fat diet and exercise and recheck in 3 months       ------

## 2013-07-30 NOTE — Telephone Encounter (Signed)
Pt aware of lab results 

## 2013-08-01 ENCOUNTER — Encounter: Payer: Self-pay | Admitting: *Deleted

## 2013-08-17 ENCOUNTER — Encounter: Payer: Self-pay | Admitting: Pharmacist

## 2013-08-17 ENCOUNTER — Ambulatory Visit (INDEPENDENT_AMBULATORY_CARE_PROVIDER_SITE_OTHER): Payer: Medicare Other | Admitting: Pharmacist

## 2013-08-17 VITALS — BP 124/68 | HR 70 | Ht 66.0 in | Wt 131.5 lb

## 2013-08-17 DIAGNOSIS — Z0000 Encounter for general adult medical examination without abnormal findings: Secondary | ICD-10-CM

## 2013-08-17 NOTE — Patient Instructions (Signed)
Health Maintenance Summary    ZOSTAVAX Postponed 09/20/2013 Patient declined    TETANUS/TDAP Postponed 10/26/2013 Patient declined - Last tetanus 11/2002   Pneumnia 23 Completed  2004    Pneumonia 3 / Prevnar 13 Due     Dexa / bone density Next Due 09/21/2014 Lasat 09/20/2012    INFLUENZA VACCINE Next Due 08/18/2013  Last 11/2013    MAMMOGRAM Next Due 01/15/2014  Last 01/15/2013    PAP SMEAR Next Due 09/13/2014  Last 09/12/2012        Preventive Care for Adults A healthy lifestyle and preventive care can promote health and wellness. Preventive health guidelines for women include the following key practices.  A routine yearly physical is a good way to check with your health care provider about your health and preventive screening. It is a chance to share any concerns and updates on your health and to receive a thorough exam.  Visit your dentist for a routine exam and preventive care every 6 months. Brush your teeth twice a day and floss once a day. Good oral hygiene prevents tooth decay and gum disease.  The frequency of eye exams is based on your age, health, family medical history, use of contact lenses, and other factors. Follow your health care provider's recommendations for frequency of eye exams.  Eat a healthy diet. Foods like vegetables, fruits, whole grains, low-fat dairy products, and lean protein foods contain the nutrients you need without too many calories. Decrease your intake of foods high in solid fats, added sugars, and salt. Eat the right amount of calories for you.Get information about a proper diet from your health care provider, if necessary.  Regular physical exercise is one of the most important things you can do for your health. Most adults should get at least 150 minutes of moderate-intensity exercise (any activity that increases your heart rate and causes you to sweat) each week. In addition, most adults need muscle-strengthening exercises on 2 or more days a  week.  Maintain a healthy weight. The body mass index (BMI) is a screening tool to identify possible weight problems. It provides an estimate of body fat based on height and weight. Your health care provider can find your BMI and can help you achieve or maintain a healthy weight.For adults 20 years and older:  A BMI below 18.5 is considered underweight.  A BMI of 18.5 to 24.9 is normal.  A BMI of 25 to 29.9 is considered overweight.  A BMI of 30 and above is considered obese.  Maintain normal blood lipids and cholesterol levels by exercising and minimizing your intake of saturated fat. Eat a balanced diet with plenty of fruit and vegetables. Blood tests for lipids and cholesterol should begin at age 7 and be repeated every 5 years. If your lipid or cholesterol levels are high, you are over 50, or you are at high risk for heart disease, you may need your cholesterol levels checked more frequently.Ongoing high lipid and cholesterol levels should be treated with medicines if diet and exercise are not working.  If you smoke, find out from your health care provider how to quit. If you do not use tobacco, do not start.  Lung cancer screening is recommended for adults aged 88-80 years who are at high risk for developing lung cancer because of a history of smoking. A yearly low-dose CT scan of the lungs is recommended for people who have at least a 30-pack-year history of smoking and are a current smoker or have  quit within the past 15 years. A pack year of smoking is smoking an average of 1 pack of cigarettes a day for 1 year (for example: 1 pack a day for 30 years or 2 packs a day for 15 years). Yearly screening should continue until the smoker has stopped smoking for at least 15 years. Yearly screening should be stopped for people who develop a health problem that would prevent them from having lung cancer treatment.  If you are pregnant, do not drink alcohol. If you are breastfeeding, be very  cautious about drinking alcohol. If you are not pregnant and choose to drink alcohol, do not have more than 1 drink per day. One drink is considered to be 12 ounces (355 mL) of beer, 5 ounces (148 mL) of wine, or 1.5 ounces (44 mL) of liquor.  Avoid use of street drugs. Do not share needles with anyone. Ask for help if you need support or instructions about stopping the use of drugs.  High blood pressure causes heart disease and increases the risk of stroke. Your blood pressure should be checked at least every 1 to 2 years. Ongoing high blood pressure should be treated with medicines if weight loss and exercise do not work.  If you are 63-90 years old, ask your health care provider if you should take aspirin to prevent strokes.  Diabetes screening involves taking a blood sample to check your fasting blood sugar level. This should be done once every 3 years, after age 104, if you are within normal weight and without risk factors for diabetes. Testing should be considered at a younger age or be carried out more frequently if you are overweight and have at least 1 risk factor for diabetes.  Breast cancer screening is essential preventive care for women. You should practice "breast self-awareness." This means understanding the normal appearance and feel of your breasts and may include breast self-examination. Any changes detected, no matter how small, should be reported to a health care provider. Women in their 60s and 30s should have a clinical breast exam (CBE) by a health care provider as part of a regular health exam every 1 to 3 years. After age 22, women should have a CBE every year. Starting at age 72, women should consider having a mammogram (breast X-ray test) every year. Women who have a family history of breast cancer should talk to their health care provider about genetic screening. Women at a high risk of breast cancer should talk to their health care providers about having an MRI and a mammogram  every year.  Breast cancer gene (BRCA)-related cancer risk assessment is recommended for women who have family members with BRCA-related cancers. BRCA-related cancers include breast, ovarian, tubal, and peritoneal cancers. Having family members with these cancers may be associated with an increased risk for harmful changes (mutations) in the breast cancer genes BRCA1 and BRCA2. Results of the assessment will determine the need for genetic counseling and BRCA1 and BRCA2 testing.  Routine pelvic exams to screen for cancer are no longer recommended for nonpregnant women who are considered low risk for cancer of the pelvic organs (ovaries, uterus, and vagina) and who do not have symptoms. Ask your health care provider if a screening pelvic exam is right for you.  If you have had past treatment for cervical cancer or a condition that could lead to cancer, you need Pap tests and screening for cancer for at least 20 years after your treatment. If Pap tests have been  discontinued, your risk factors (such as having a new sexual partner) need to be reassessed to determine if screening should be resumed. Some women have medical problems that increase the chance of getting cervical cancer. In these cases, your health care provider may recommend more frequent screening and Pap tests.  The HPV test is an additional test that may be used for cervical cancer screening. The HPV test looks for the virus that can cause the cell changes on the cervix. The cells collected during the Pap test can be tested for HPV. The HPV test could be used to screen women aged 56 years and older, and should be used in women of any age who have unclear Pap test results. After the age of 4, women should have HPV testing at the same frequency as a Pap test.  Colorectal cancer can be detected and often prevented. Most routine colorectal cancer screening begins at the age of 50 years and continues through age 29 years. However, your health care  provider may recommend screening at an earlier age if you have risk factors for colon cancer. On a yearly basis, your health care provider may provide home test kits to check for hidden blood in the stool. Use of a small camera at the end of a tube, to directly examine the colon (sigmoidoscopy or colonoscopy), can detect the earliest forms of colorectal cancer. Talk to your health care provider about this at age 35, when routine screening begins. Direct exam of the colon should be repeated every 5-10 years through age 30 years, unless early forms of pre-cancerous polyps or small growths are found.  People who are at an increased risk for hepatitis B should be screened for this virus. You are considered at high risk for hepatitis B if:  You were born in a country where hepatitis B occurs often. Talk with your health care provider about which countries are considered high risk.  Your parents were born in a high-risk country and you have not received a shot to protect against hepatitis B (hepatitis B vaccine).  You have HIV or AIDS.  You use needles to inject street drugs.  You live with, or have sex with, someone who has hepatitis B.  You get hemodialysis treatment.  You take certain medicines for conditions like cancer, organ transplantation, and autoimmune conditions.  Hepatitis C blood testing is recommended for all people born from [birth year] through 1965 and any individual with known risks for hepatitis C.  Practice safe sex. Use condoms and avoid high-risk sexual practices to reduce the spread of sexually transmitted infections (STIs). STIs include gonorrhea, chlamydia, syphilis, trichomonas, herpes, HPV, and human immunodeficiency virus (HIV). Herpes, HIV, and HPV are viral illnesses that have no cure. They can result in disability, cancer, and death.  You should be screened for sexually transmitted illnesses (STIs) including gonorrhea and chlamydia if:  You are sexually active and are  younger than 24 years.  You are older than 24 years and your health care provider tells you that you are at risk for this type of infection.  Your sexual activity has changed since you were last screened and you are at an increased risk for chlamydia or gonorrhea. Ask your health care provider if you are at risk.  If you are at risk of being infected with HIV, it is recommended that you take a prescription medicine daily to prevent HIV infection. This is called preexposure prophylaxis (PrEP). You are considered at risk if:  You are a  heterosexual woman, are sexually active, and are at increased risk for HIV infection.  You take drugs by injection.  You are sexually active with a partner who has HIV.  Talk with your health care provider about whether you are at high risk of being infected with HIV. If you choose to begin PrEP, you should first be tested for HIV. You should then be tested every 3 months for as long as you are taking PrEP.  Osteoporosis is a disease in which the bones lose minerals and strength with aging. This can result in serious bone fractures or breaks. The risk of osteoporosis can be identified using a bone density scan. Women ages 47 years and over and women at risk for fractures or osteoporosis should discuss screening with their health care providers. Ask your health care provider whether you should take a calcium supplement or vitamin D to reduce the rate of osteoporosis.  Menopause can be associated with physical symptoms and risks. Hormone replacement therapy is available to decrease symptoms and risks. You should talk to your health care provider about whether hormone replacement therapy is right for you.  Use sunscreen. Apply sunscreen liberally and repeatedly throughout the day. You should seek shade when your shadow is shorter than you. Protect yourself by wearing long sleeves, pants, a wide-brimmed hat, and sunglasses year round, whenever you are outdoors.  Once a  month, do a whole body skin exam, using a mirror to look at the skin on your back. Tell your health care provider of new moles, moles that have irregular borders, moles that are larger than a pencil eraser, or moles that have changed in shape or color.  Stay current with required vaccines (immunizations).  Influenza vaccine. All adults should be immunized every year.  Tetanus, diphtheria, and acellular pertussis (Td, Tdap) vaccine. Pregnant women should receive 1 dose of Tdap vaccine during each pregnancy. The dose should be obtained regardless of the length of time since the last dose. Immunization is preferred during the 27th-36th week of gestation. An adult who has not previously received Tdap or who does not know her vaccine status should receive 1 dose of Tdap. This initial dose should be followed by tetanus and diphtheria toxoids (Td) booster doses every 10 years. Adults with an unknown or incomplete history of completing a 3-dose immunization series with Td-containing vaccines should begin or complete a primary immunization series including a Tdap dose. Adults should receive a Td booster every 10 years.  Varicella vaccine. An adult without evidence of immunity to varicella should receive 2 doses or a second dose if she has previously received 1 dose. Pregnant females who do not have evidence of immunity should receive the first dose after pregnancy. This first dose should be obtained before leaving the health care facility. The second dose should be obtained 4-8 weeks after the first dose.  Measles, mumps, and rubella (MMR) vaccine. Adults born before [birth year] generally are considered immune to measles and mumps. Adults born in [birth year] or later should have 1 or more doses of MMR vaccine unless there is a contraindication to the vaccine or there is laboratory evidence of immunity to each of the three diseases. A routine second dose of MMR vaccine should be obtained at least 28 days after the first dose for  students attending postsecondary schools, health care workers, or international travelers. People who received inactivated measles vaccine or an unknown type of measles vaccine during 1963-1967 should receive 2 doses of MMR vaccine. People who received  inactivated mumps vaccine or an unknown type of mumps vaccine before 1979 and are at high risk for mumps infection should consider immunization with 2 doses of MMR vaccine. For females of childbearing age, rubella immunity should be determined. If there is no evidence of immunity, females who are not pregnant should be vaccinated. If there is no evidence of immunity, females who are pregnant should delay immunization until after pregnancy. Unvaccinated health care workers born before [birth year] who lack laboratory evidence of measles, mumps, or rubella immunity or laboratory confirmation of disease should consider measles and mumps immunization with 2 doses of MMR vaccine or rubella immunization with 1 dose of MMR vaccine.   Pneumococcal 13-valent conjugate (PCV13) vaccine. When indicated, a person who is uncertain of her immunization history and has no record of immunization should receive the PCV13 vaccine. An adult aged 13 years or older who has certain medical conditions and has not been previously immunized should receive 1 dose of PCV13 vaccine. This PCV13 should be followed with a dose of pneumococcal polysaccharide (PPSV23) vaccine. The PPSV23 vaccine dose should be obtained at least 8 weeks after the dose of PCV13 vaccine. An adult aged 70 years or older who has certain medical conditions and previously received 1 or more doses of PPSV23 vaccine should receive 1 dose of PCV13. The PCV13 vaccine dose should be obtained 1 or more years after the last PPSV23 vaccine dose.   Pneumococcal polysaccharide (PPSV23) vaccine. When PCV13 is also indicated, PCV13 should be obtained first. All adults aged 20 years and older should be immunized. An adult younger than age  53 years who has certain medical conditions should be immunized. Any person who resides in a nursing home or long-term care facility should be immunized. An adult smoker should be immunized. People with an immunocompromised condition and certain other conditions should receive both PCV13 and PPSV23 vaccines. People with human immunodeficiency virus (HIV) infection should be immunized as soon as possible after diagnosis. Immunization during chemotherapy or radiation therapy should be avoided. Routine use of PPSV23 vaccine is not recommended for American Indians, [place] Natives, or people younger than 65 years unless there are medical conditions that require PPSV23 vaccine. When indicated, people who have unknown immunization and have no record of immunization should receive PPSV23 vaccine. One-time revaccination 5 years after the first dose of PPSV23 is recommended for people aged 19-64 years who have chronic kidney failure, nephrotic syndrome, asplenia, or immunocompromised conditions. People who received 1-2 doses of PPSV23 before age 20 years should receive another dose of PPSV23 vaccine at age 58 years or later if at least 5 years have passed since the previous dose. Doses of PPSV23 are not needed for people immunized with PPSV23 at or after age 85 years.  Meningococcal vaccine. Adults with asplenia or persistent complement component deficiencies should receive 2 doses of quadrivalent meningococcal conjugate (MenACWY-D) vaccine. The doses should be obtained at least 2 months apart. Microbiologists working with certain meningococcal bacteria, [employer] recruits, people at risk during an outbreak, and people who travel to or live in countries with a high rate of meningitis should be immunized. A first-year college student up through age 47 years who is living in a residence hall should receive a dose if she did not receive a dose on or after her 16th birthday. Adults who have certain high-risk conditions should  receive one or more doses of vaccine.  Hepatitis A vaccine. Adults who wish to be protected from this disease, have certain high-risk conditions,  work with hepatitis A-infected animals, work in hepatitis A research labs, or travel to or work in countries with a high rate of hepatitis A should be immunized. Adults who were previously unvaccinated and who anticipate close contact with an international adoptee during the first 60 days after arrival in the [place] from a country with a high rate of hepatitis A should be immunized.  Hepatitis B vaccine. Adults who wish to be protected from this disease, have certain high-risk conditions, may be exposed to blood or other infectious body fluids, are household contacts or sex partners of hepatitis B positive people, are clients or workers in certain care facilities, or travel to or work in countries with a high rate of hepatitis B should be immunized.

## 2013-08-17 NOTE — Progress Notes (Signed)
Patient ID: KALAH PFLUM, female   DOB: [birth year], 80 y.o.   MRN: [id number] Subjective:    Laura Acevedo is a 80 y.o. female who presents for Medicare Initial Wellness Visit  Preventive Screening-Counseling & Management  Tobacco History  Smoking status  . Never Smoker   Smokeless tobacco  . Never Used    Current Problems (verified) Patient Active Problem List   Diagnosis Date Noted  . Hyperlipidemia 09/20/2012  . Essential hypertension, benign 09/20/2012  . Osteoporosis 06/15/2012    Medications Prior to Visit Current Outpatient Prescriptions on File Prior to Visit  Medication Sig Dispense Refill  . atorvastatin (LIPITOR) 20 MG tablet Take 1 tablet (20 mg total) by mouth daily.  90 tablet  1  . calcium carbonate (OS-CAL) 600 MG TABS Take 600 mg by mouth daily. AT LUNCH      . cholecalciferol (VITAMIN D) 1000 UNITS tablet Take 2,000 Units by mouth daily.       Marland Kitchen denosumab (PROLIA) 60 MG/ML SOLN injection Inject 60 mg into the skin every 6 (six) months. Administer in upper arm, thigh, or abdomen      . lisinopril-hydrochlorothiazide (PRINZIDE,ZESTORETIC) 10-12.5 MG per tablet Take 1 tablet by mouth daily.  90 tablet  1  . Multiple Vitamins-Minerals (CENTRUM SILVER PO) Take 1 tablet by mouth every morning.      . Multiple Vitamins-Minerals (PRESERVISION AREDS 2 PO) Take by mouth 2 (two) times daily.       No current facility-administered medications on file prior to visit.    Current Medications (verified) Current Outpatient Prescriptions  Medication Sig Dispense Refill  . atorvastatin (LIPITOR) 20 MG tablet Take 1 tablet (20 mg total) by mouth daily.  90 tablet  1  . calcium carbonate (OS-CAL) 600 MG TABS Take 600 mg by mouth daily. AT LUNCH      . cholecalciferol (VITAMIN D) 1000 UNITS tablet Take 2,000 Units by mouth daily.       Marland Kitchen denosumab (PROLIA) 60 MG/ML SOLN injection Inject 60 mg into the skin every 6 (six) months. Administer in upper arm, thigh, or abdomen      .  lisinopril-hydrochlorothiazide (PRINZIDE,ZESTORETIC) 10-12.5 MG per tablet Take 1 tablet by mouth daily.  90 tablet  1  . Multiple Vitamins-Minerals (CENTRUM SILVER PO) Take 1 tablet by mouth every morning.      . Multiple Vitamins-Minerals (PRESERVISION AREDS 2 PO) Take by mouth 2 (two) times daily.       No current facility-administered medications for this visit.     Allergies (verified) Review of patient's allergies indicates no known allergies.   PAST HISTORY  Family History Family History  Problem Relation Age of Onset  . Diabetes Mother   . Heart disease Mother   . Heart disease Father   . Heart attack Father   . Colon cancer Neg Hx   . Hypertension Sister   . Diabetes Brother   . Hypertension Brother   . Cancer Brother     bladder   . Diabetes Brother   . Diabetes Brother     Social History History  Substance Use Topics  . Smoking status: Never Smoker   . Smokeless tobacco: Never Used  . Alcohol Use: No     Are there smokers in your home (other than you)? No  Risk Factors Current exercise habits: [place] regularly  Dietary issues discussed: none   Cardiac risk factors: advanced age (older than 25 for men, 43 for women), dyslipidemia and family history  of premature cardiovascular disease.  Depression Screen (Note: if answer to either of the following is "Yes", a more complete depression screening is indicated)   Over the past 2 weeks, have you felt down, depressed or hopeless? No  Over the past 2 weeks, have you felt little interest or pleasure in doing things? No  Have you lost interest or pleasure in daily life? No  Do you often feel hopeless? No  Do you cry easily over simple problems? No  Activities of Daily Living In your present state of health, do you have any difficulty performing the following activities?:  Driving? No Managing money?  No Feeding yourself? No Getting from bed to chair? No  Climbing a flight of stairs? No Preparing food and  eating?: No Bathing or showering? No Getting dressed: No Getting to the toilet? No Using the toilet:No Moving around from place to place: No In the past year have you fallen or had a near fall?:No   Are you sexually active?  Yes  Do you have more than one partner?  No  Hearing Difficulties: No Do you often ask people to speak up or repeat themselves? No Do you experience ringing or noises in your ears? No Do you have difficulty understanding soft or whispered voices? No   Do you feel that you have a problem with memory? No  Do you often misplace items? No  Do you feel safe at home?  Yes  Cognitive Testing  Alert? Yes  Normal Appearance?Yes  Oriented to person? Yes  Place? Yes   Time? Yes  Recall of three objects?  Yes  Can perform simple calculations? Yes  Displays appropriate judgment?Yes  Can read the correct time from a watch face?Yes   Advanced Directives have been discussed with the patient? Yes  List the Names of Other Physician/Practitioners you currently use: 1.  Calvert Cantor - ophthalmologist 2.  Steffanie Rainwater - podiatrist  3.  Erskine Emery - GI  Indicate any recent Medical Services you may have received from other than [facility] providers in the past year (date may be approximate).  Immunization History  Administered Date(s) Administered  . Influenza,inj,Quad PF,36+ Mos 11/22/2012    Screening Tests Health Maintenance  Topic Date Due  . Zostavax  09/20/2013 (Originally 06/06/1994)  . Tetanus/tdap  10/26/2013 (Originally 11/18/2012)  . Influenza Vaccine  08/18/2013  . Mammogram  01/15/2014  . Pap Smear  09/13/2014  . Pneumococcal Polysaccharide Vaccine Age 72 And Over  Completed    All answers were reviewed with the patient and necessary referrals were made:  Cherre Robins, [facility]   08/17/2013   History reviewed: allergies, current medications, past family history, past medical history, past social history, past surgical history and problem list     Objective:    Body mass index is 21.23 kg/(m^2). BP 124/68  Pulse 70  Ht 5\' 6"  (1.676 m)  Wt 131 lb 8 oz (59.648 kg)  BMI 21.23 kg/m2   Assessment:  Medicare Annual Wellness Visit  Plan:     During the course of the visit the patient was educated and counseled about appropriate screening and preventive services including:    Pneumococcal vaccine - discussed Prevnar 13.  Patient declined today  Influenza vaccine - due in Fall 2015  Hepatitis B vaccine - patient declined  Td vaccine - patient declined  Screening electrocardiogram  Screening mammography - UTD  Screening Pap smear and pelvic exam -UTD  Bone densitometry screening - UTD  Colorectal cancer screening -  patien declined colonoscopy  Diabetes screening  Glaucoma screening - patient to make appt with Dr Bing Plume  Nutrition counseling - not indicated  Advanced directives: No advanced directives - caring connections packet given  Diet review for nutrition referral? Yes ____  Not Indicated _X___   Patient Instructions (the written plan) was given to the patient.  Medicare Attestation I have personally reviewed: The patient's medical and social history Their use of alcohol, tobacco or illicit drugs Their current medications and supplements The patient's functional ability including ADLs,fall risks, home safety risks, cognitive, and hearing and visual impairment Diet and physical activities Evidence for depression or mood disorders  The patient's weight, height, BMI, and BP/HR have been recorded in the chart.  I have made referrals, counseling, and provided education to the patient based on review of the above and I have provided the patient with a written personalized care plan for preventive services.     Cherre Robins, [facility]   08/17/2013

## 2013-11-05 ENCOUNTER — Encounter: Payer: Self-pay | Admitting: Nurse Practitioner

## 2013-11-05 ENCOUNTER — Ambulatory Visit (INDEPENDENT_AMBULATORY_CARE_PROVIDER_SITE_OTHER): Payer: Medicare Other | Admitting: Nurse Practitioner

## 2013-11-05 VITALS — BP 135/72 | HR 74 | Temp 96.5°F | Ht 66.0 in | Wt 133.4 lb

## 2013-11-05 DIAGNOSIS — I10 Essential (primary) hypertension: Secondary | ICD-10-CM

## 2013-11-05 DIAGNOSIS — M810 Age-related osteoporosis without current pathological fracture: Secondary | ICD-10-CM

## 2013-11-05 DIAGNOSIS — E785 Hyperlipidemia, unspecified: Secondary | ICD-10-CM

## 2013-11-05 DIAGNOSIS — Z23 Encounter for immunization: Secondary | ICD-10-CM

## 2013-11-05 MED ORDER — ATORVASTATIN CALCIUM 20 MG PO TABS: 20.0000 mg | ORAL_TABLET | Freq: Every day | ORAL | Status: DC

## 2013-11-05 MED ORDER — LISINOPRIL-HYDROCHLOROTHIAZIDE 10-12.5 MG PO TABS: 1.0000 | ORAL_TABLET | Freq: Every day | ORAL | Status: DC

## 2013-11-05 NOTE — Progress Notes (Signed)
Subjective:    Patient ID: Laura Acevedo, female    DOB: Sep 25, 1932, 80 y.o.   MRN: [id number]  Patient here today for follow up of chronic medical problems. SHe was concerned at last visit with her weight loss- She has actually gained a few pounds this time. She is doin well today without complaints.   Hypertension This is a chronic problem. The current episode started more than 1 year ago. The problem is controlled. Pertinent negatives include no chest pain, orthopnea, palpitations, peripheral edema, shortness of breath or sweats. There are no associated agents to hypertension. Risk factors for coronary artery disease include dyslipidemia and family history. Past treatments include ACE inhibitors and diuretics. The current treatment provides mild improvement. There are no compliance problems.   Hyperlipidemia This is a chronic problem. The current episode started more than 1 year ago. The problem is controlled. Recent lipid tests were reviewed and are normal. She has no history of diabetes, hypothyroidism, liver disease, obesity or nephrotic syndrome. Factors aggravating her hyperlipidemia include thiazides. Pertinent negatives include no chest pain, focal sensory loss, focal weakness, leg pain, myalgias or shortness of breath. Current antihyperlipidemic treatment includes statins. The current treatment provides moderate improvement of lipids. There are no compliance problems.  Risk factors for coronary artery disease include hypertension and post-menopausal.  osteoporosis Prolia every 6 months- doing well no complaints   Review of Systems  Constitutional: Negative for appetite change and unexpected weight change.  HENT: Negative.   Eyes: Negative.   Respiratory: Negative for shortness of breath.   Cardiovascular: Negative for chest pain, palpitations and orthopnea.  Genitourinary: Negative.   Musculoskeletal: Negative.  Negative for myalgias.  Neurological: Negative.  Negative for focal  weakness.  Psychiatric/Behavioral: Negative.   All other systems reviewed and are negative.      Objective:   Physical Exam  Constitutional: She is oriented to person, place, and time. She appears well-developed and well-nourished.  HENT:  Head: Normocephalic.  Right Ear: Hearing, tympanic membrane, external ear and ear canal normal.  Left Ear: Hearing, tympanic membrane, external ear and ear canal normal.  Nose: Nose normal.  Mouth/Throat: Uvula is midline and oropharynx is clear and moist.  Eyes: Conjunctivae and EOM are normal. Pupils are equal, round, and reactive to light.  Neck: Normal range of motion and full passive range of motion without pain. Neck supple. No JVD present. Carotid bruit is not present. No mass and no thyromegaly present.  Cardiovascular: Normal rate, normal heart sounds and intact distal pulses.   No murmur heard. Pulmonary/Chest: Effort normal and breath sounds normal. Right breast exhibits no inverted nipple, no mass, no nipple discharge, no skin change and no tenderness. Left breast exhibits no inverted nipple, no mass, no nipple discharge, no skin change and no tenderness.  Abdominal: Soft. Bowel sounds are normal. She exhibits no mass. There is no tenderness.  Genitourinary: No breast swelling, tenderness, discharge or bleeding.  Musculoskeletal: Normal range of motion.  Lymphadenopathy:    She has no cervical adenopathy.  Neurological: She is alert and oriented to person, place, and time.  Skin: Skin is warm and dry.  Psychiatric: She has a normal mood and affect. Her behavior is normal. Judgment and thought content normal.    BP 135/72  Pulse 74  Temp(Src) 96.5 F (35.8 C) (Oral)  Ht 5' 6"  (1.676 m)  Wt 133 lb 6.4 oz (60.51 kg)  BMI 21.54 kg/m2       Assessment & Plan:   1.  Osteoporosis Weight bearing exercises  2. Hyperlipidemia *2Low fat diet** - atorvastatin (LIPITOR) 20 MG tablet; Take 1 tablet (20 mg total) by mouth daily.   Dispense: 90 tablet; Refill: 1 - Lipid panel  3. Essential hypertension, benign Low NA+ diet - CMP14+EGFR - lisinopril-hydrochlorothiazide (PRINZIDE,ZESTORETIC) 10-12.5 MG per tablet; Take 1 tablet by mouth daily.  Dispense: 90 tablet; Refill: 1   Flu shot and prevnar today Labs pending Health maintenance reviewed Diet and exercise encouraged Continue all meds Follow up  In 3 months   [place], FNP

## 2013-11-05 NOTE — Patient Instructions (Signed)

## 2013-11-06 ENCOUNTER — Telehealth: Payer: Self-pay | Admitting: Family Medicine

## 2013-11-06 LAB — CMP14+EGFR
A/G RATIO: 2.1 (ref 1.1–2.5)
ALK PHOS: 57 IU/L (ref 39–117)
ALT: 30 IU/L (ref 0–32)
AST: 27 IU/L (ref 0–40)
Albumin: 4.5 g/dL (ref 3.5–4.8)
BILIRUBIN TOTAL: 1 mg/dL (ref 0.0–1.2)
BUN / CREAT RATIO: 19 (ref 11–26)
BUN: 20 mg/dL (ref 8–27)
CO2: 27 mmol/L (ref 18–29)
Calcium: 10 mg/dL (ref 8.7–10.3)
Chloride: 98 mmol/L (ref 97–108)
Creatinine, Ser: 1.03 mg/dL — ABNORMAL HIGH (ref 0.57–1.00)
GFR, EST AFRICAN AMERICAN: 60 mL/min/{1.73_m2} (ref >59)
GFR, EST NON AFRICAN AMERICAN: 52 mL/min/{1.73_m2} — AB (ref >59)
Globulin, Total: 2.1 g/dL (ref 1.5–4.5)
Glucose: 107 mg/dL — ABNORMAL HIGH (ref 65–99)
Potassium: 4 mmol/L (ref 3.5–5.2)
SODIUM: 140 mmol/L (ref 134–144)
Total Protein: 6.6 g/dL (ref 6.0–8.5)

## 2013-11-06 LAB — LIPID PANEL
CHOL/HDL RATIO: 1.5 ratio (ref 0.0–4.4)
Cholesterol, Total: 159 mg/dL (ref 100–199)
HDL: 105 mg/dL (ref >39)
LDL Calculated: 42 mg/dL (ref 0–99)
TRIGLYCERIDES: 62 mg/dL (ref 0–149)
VLDL Cholesterol Cal: 12 mg/dL (ref 5–40)

## 2013-11-06 NOTE — Telephone Encounter (Signed)
Message copied by Waverly Ferrari on Tue Nov 06, 2013 11:27 AM ------      Message from: Chevis Pretty      Created: Tue Nov 06, 2013 10:40 AM       Kidney and liver function stable      Cholesterol looks good      Continue current meds- low fat diet and exercise and recheck in 3 months       ------

## 2013-12-04 ENCOUNTER — Other Ambulatory Visit: Payer: Medicare Other | Admitting: Nurse Practitioner

## 2014-02-11 ENCOUNTER — Ambulatory Visit: Payer: Medicare Other | Admitting: Nurse Practitioner

## 2014-02-12 ENCOUNTER — Encounter: Payer: Self-pay | Admitting: Nurse Practitioner

## 2014-02-12 ENCOUNTER — Ambulatory Visit (INDEPENDENT_AMBULATORY_CARE_PROVIDER_SITE_OTHER): Payer: Medicare Other | Admitting: Nurse Practitioner

## 2014-02-12 VITALS — BP 128/80 | HR 71 | Temp 96.8°F | Ht 66.0 in | Wt 130.0 lb

## 2014-02-12 DIAGNOSIS — E785 Hyperlipidemia, unspecified: Secondary | ICD-10-CM

## 2014-02-12 DIAGNOSIS — I10 Essential (primary) hypertension: Secondary | ICD-10-CM | POA: Diagnosis not present

## 2014-02-12 DIAGNOSIS — M810 Age-related osteoporosis without current pathological fracture: Secondary | ICD-10-CM

## 2014-02-12 NOTE — Patient Instructions (Signed)

## 2014-02-12 NOTE — Progress Notes (Signed)
Subjective:    Patient ID: Laura Acevedo, female    DOB: 1932-04-13, 81 y.o.   MRN: [id number]  Patient here today for follow up of chronic medical problems. We have been concerned about her weight which she has maintained since last visit.   Hypertension This is a chronic problem. The current episode started more than 1 year ago. The problem is unchanged. The problem is controlled. Pertinent negatives include no chest pain, palpitations or shortness of breath. Risk factors for coronary artery disease include dyslipidemia and post-menopausal state. Past treatments include ACE inhibitors and diuretics. The current treatment provides moderate improvement. Compliance problems include diet and exercise.   Hyperlipidemia This is a chronic problem. The current episode started more than 1 year ago. The problem is controlled. Recent lipid tests were reviewed and are normal. She has no history of diabetes, hypothyroidism or obesity. Pertinent negatives include no chest pain, myalgias or shortness of breath. Current antihyperlipidemic treatment includes statins. The current treatment provides moderate improvement of lipids. Compliance problems include adherence to diet and adherence to exercise.  Risk factors for coronary artery disease include dyslipidemia, hypertension and post-menopausal.  osteoporosis Prolia every 6 months- doing well no complaints   Review of Systems  Constitutional: Negative for appetite change and unexpected weight change.  HENT: Negative.   Eyes: Negative.   Respiratory: Negative for shortness of breath.   Cardiovascular: Negative for chest pain and palpitations.  Genitourinary: Negative.   Musculoskeletal: Negative.  Negative for myalgias.  Neurological: Negative.   Psychiatric/Behavioral: Negative.   All other systems reviewed and are negative.      Objective:   Physical Exam  Constitutional: She is oriented to person, place, and time. She appears well-developed and  well-nourished.  HENT:  Head: Normocephalic.  Right Ear: Hearing, tympanic membrane, external ear and ear canal normal.  Left Ear: Hearing, tympanic membrane, external ear and ear canal normal.  Nose: Nose normal.  Mouth/Throat: Uvula is midline and oropharynx is clear and moist.  Eyes: Conjunctivae and EOM are normal. Pupils are equal, round, and reactive to light.  Neck: Normal range of motion and full passive range of motion without pain. Neck supple. No JVD present. Carotid bruit is not present. No thyroid mass and no thyromegaly present.  Cardiovascular: Normal rate, normal heart sounds and intact distal pulses.   No murmur heard. Pulmonary/Chest: Effort normal and breath sounds normal. Right breast exhibits no inverted nipple, no mass, no nipple discharge, no skin change and no tenderness. Left breast exhibits no inverted nipple, no mass, no nipple discharge, no skin change and no tenderness.  Abdominal: Soft. Bowel sounds are normal. She exhibits no mass. There is no tenderness.  Genitourinary: No breast swelling, tenderness, discharge or bleeding.  Musculoskeletal: Normal range of motion.  Lymphadenopathy:    She has no cervical adenopathy.  Neurological: She is alert and oriented to person, place, and time.  Skin: Skin is warm and dry.  Psychiatric: She has a normal mood and affect. Her behavior is normal. Judgment and thought content normal.    BP 128/80 mmHg  Pulse 71  Temp(Src) 96.8 F (36 C) (Oral)  Ht _0  (1.676 m)  Wt 130 lb (58.968 kg)  BMI 20.99 kg/m2        Assessment & Plan:   1. Essential hypertension, benign Do not add salt to diet - CMP14+EGFR  2. Hyperlipidemia Low fat diet - NMR, lipoprofile  3. Osteoporosis Weight bearing exercises dexascan due later this year  Labs pending Health maintenance reviewed Diet and exercise encouraged Continue all meds Follow up  In 3 months   [place], FNP

## 2014-02-13 LAB — NMR, LIPOPROFILE
CHOLESTEROL: 168 mg/dL (ref 100–199)
HDL CHOLESTEROL BY NMR: 97 mg/dL (ref >39)
HDL Particle Number: 43.1 umol/L (ref >=30.5)
LDL Particle Number: 531 nmol/L (ref <1000)
LDL SIZE: 21 nm (ref >20.5)
LDL-C: 54 mg/dL (ref 0–99)
LP-IR SCORE: 29 (ref <=45)
Small LDL Particle Number: <90 nmol/L (ref <=527)
Triglycerides by NMR: 85 mg/dL (ref 0–149)

## 2014-02-13 LAB — CMP14+EGFR
ALK PHOS: 63 IU/L (ref 39–117)
ALT: 30 IU/L (ref 0–32)
AST: 27 IU/L (ref 0–40)
Albumin/Globulin Ratio: 2 (ref 1.1–2.5)
Albumin: 4.3 g/dL (ref 3.5–4.8)
BUN/Creatinine Ratio: 17 (ref 11–26)
BUN: 18 mg/dL (ref 8–27)
CO2: 26 mmol/L (ref 18–29)
Calcium: 9.9 mg/dL (ref 8.7–10.3)
Chloride: 97 mmol/L (ref 97–108)
Creatinine, Ser: 1.03 mg/dL — ABNORMAL HIGH (ref 0.57–1.00)
GFR calc Af Amer: 60 mL/min/{1.73_m2} (ref >59)
GFR calc non Af Amer: 52 mL/min/{1.73_m2} — ABNORMAL LOW (ref >59)
Globulin, Total: 2.2 g/dL (ref 1.5–4.5)
Glucose: 95 mg/dL (ref 65–99)
Potassium: 4.1 mmol/L (ref 3.5–5.2)
SODIUM: 139 mmol/L (ref 134–144)
Total Bilirubin: 1 mg/dL (ref 0.0–1.2)
Total Protein: 6.5 g/dL (ref 6.0–8.5)

## 2014-02-18 ENCOUNTER — Ambulatory Visit (INDEPENDENT_AMBULATORY_CARE_PROVIDER_SITE_OTHER): Payer: Medicare Other | Admitting: Pharmacist

## 2014-02-18 ENCOUNTER — Encounter: Payer: Self-pay | Admitting: Pharmacist

## 2014-02-18 DIAGNOSIS — M810 Age-related osteoporosis without current pathological fracture: Secondary | ICD-10-CM | POA: Diagnosis not present

## 2014-02-18 MED ORDER — DENOSUMAB 60 MG/ML ~~LOC~~ SOLN
60.0000 mg | Freq: Once | SUBCUTANEOUS | Status: AC
  Administered 2014-02-18: 60 mg via SUBCUTANEOUS

## 2014-02-18 NOTE — Progress Notes (Signed)
Patient ID: Laura Acevedo, female   DOB: Aug 11, 1932, 81 y.o.   MRN: [id number]  Osteoporosis Clinic Patient has diagnosis of osteoporosis  Patient with post menopausal osteoporosis here today for Prolia injection.   Her last dose was 07/2013.  She has tolerated well and reports no know side effects. She is supplementing with calcium and vitamin D in adequate amounts.  Last Serum creatinine and vitamin D was WNL  Next Dexa due 09/2014  Assessment: Osteoporosis, post menopausal  Recommendations: 1.  Prolia 60mg  injection given today.  (patient paid $170.00) 2.  continue calcium 1200mg  daily either through supplementation or diet.   3.  Repeat Dexa ordered for 09/2014 4.  Also made follow up appt for PCP - 3 month recheck and appt for AWV for August 2016.    Time spent counseling patient:  15 minutes  Cherre Robins, PharmD, CPP

## 2014-03-11 DIAGNOSIS — H3531 Nonexudative age-related macular degeneration: Secondary | ICD-10-CM | POA: Diagnosis not present

## 2014-04-19 ENCOUNTER — Ambulatory Visit (INDEPENDENT_AMBULATORY_CARE_PROVIDER_SITE_OTHER): Payer: Medicare Other | Admitting: Nurse Practitioner

## 2014-04-19 ENCOUNTER — Encounter: Payer: Self-pay | Admitting: Nurse Practitioner

## 2014-04-19 VITALS — BP 142/80 | HR 78 | Temp 96.9°F | Ht 66.0 in | Wt 132.0 lb

## 2014-04-19 DIAGNOSIS — I10 Essential (primary) hypertension: Secondary | ICD-10-CM | POA: Diagnosis not present

## 2014-04-19 DIAGNOSIS — E785 Hyperlipidemia, unspecified: Secondary | ICD-10-CM

## 2014-04-19 MED ORDER — ATORVASTATIN CALCIUM 20 MG PO TABS: 20.0000 mg | ORAL_TABLET | Freq: Every day | ORAL | Status: DC

## 2014-04-19 MED ORDER — LISINOPRIL-HYDROCHLOROTHIAZIDE 10-12.5 MG PO TABS: 1.0000 | ORAL_TABLET | Freq: Every day | ORAL | Status: DC

## 2014-04-19 NOTE — Progress Notes (Signed)
Subjective:    Patient ID: Laura Acevedo, female    DOB: 1932/08/28, 81 y.o.   MRN: [id number]  Patient here today for follow up of chronic medical problems.    Hypertension This is a chronic problem. The current episode started more than 1 year ago. The problem is unchanged. The problem is controlled. Pertinent negatives include no chest pain, palpitations or shortness of breath. Risk factors for coronary artery disease include dyslipidemia and post-menopausal state. Past treatments include ACE inhibitors and diuretics. The current treatment provides moderate improvement. Compliance problems include diet and exercise.   Hyperlipidemia This is a chronic problem. The current episode started more than 1 year ago. The problem is controlled. Recent lipid tests were reviewed and are normal. She has no history of diabetes, hypothyroidism or obesity. Pertinent negatives include no chest pain, myalgias or shortness of breath. Current antihyperlipidemic treatment includes statins. The current treatment provides moderate improvement of lipids. Compliance problems include adherence to diet and adherence to exercise.  Risk factors for coronary artery disease include dyslipidemia, hypertension and post-menopausal.  osteoporosis Prolia every 6 months- doing well no complaints   Review of Systems  Constitutional: Negative for appetite change and unexpected weight change.  HENT: Negative.   Eyes: Negative.   Respiratory: Negative for shortness of breath.   Cardiovascular: Negative for chest pain and palpitations.  Genitourinary: Negative.   Musculoskeletal: Negative.  Negative for myalgias.  Neurological: Negative.   Psychiatric/Behavioral: Negative.   All other systems reviewed and are negative.      Objective:   Physical Exam  Constitutional: She is oriented to person, place, and time. She appears well-developed and well-nourished.  HENT:  Head: Normocephalic.  Right Ear: Hearing, tympanic membrane,  external ear and ear canal normal.  Left Ear: Hearing, tympanic membrane, external ear and ear canal normal.  Nose: Nose normal.  Mouth/Throat: Uvula is midline and oropharynx is clear and moist.  Eyes: Conjunctivae and EOM are normal. Pupils are equal, round, and reactive to light.  Neck: Normal range of motion and full passive range of motion without pain. Neck supple. No JVD present. Carotid bruit is not present. No thyroid mass and no thyromegaly present.  Cardiovascular: Normal rate, normal heart sounds and intact distal pulses.   No murmur heard. Pulmonary/Chest: Effort normal and breath sounds normal. Right breast exhibits no inverted nipple, no mass, no nipple discharge, no skin change and no tenderness. Left breast exhibits no inverted nipple, no mass, no nipple discharge, no skin change and no tenderness.  Abdominal: Soft. Bowel sounds are normal. She exhibits no mass. There is no tenderness.  Genitourinary: No breast swelling, tenderness, discharge or bleeding.  Musculoskeletal: Normal range of motion.  Lymphadenopathy:    She has no cervical adenopathy.  Neurological: She is alert and oriented to person, place, and time.  Skin: Skin is warm and dry.  Psychiatric: She has a normal mood and affect. Her behavior is normal. Judgment and thought content normal.    BP 142/80 mmHg  Pulse 78  Temp(Src) 96.9 F (36.1 C) (Oral)  Ht 5\' 6"  (1.676 m)  Wt 132 lb (59.875 kg)  BMI 21.32 kg/m2        Assessment & Plan:  1. Essential hypertension, benign Do not add salt to diet - lisinopril-hydrochlorothiazide (PRINZIDE,ZESTORETIC) 10-12.5 MG per tablet; Take 1 tablet by mouth daily.  Dispense: 90 tablet; Refill: 1  2. Hyperlipidemia Low fat diet - atorvastatin (LIPITOR) 20 MG tablet; Take 1 tablet (20 mg total) by  mouth daily.  Dispense: 90 tablet; Refill: 1    Labs not done to early- will wait and do at next [place] maintenance reviewed Diet and exercise  encouraged Continue all meds Follow up  In 3 month   [place], FNP

## 2014-04-19 NOTE — Patient Instructions (Signed)
Bone Health Our bones do many things. They provide structure, protect organs, anchor muscles, and store calcium. Adequate calcium in your diet and weight-bearing physical activity help build strong bones, improve bone amounts, and may reduce the risk of weakening of bones (osteoporosis) later in life. PEAK BONE MASS By age 79, the average woman has acquired most of her skeletal bone mass. A large decline occurs in older adults which increases the risk of osteoporosis. In women this occurs around the time of menopause. It is important for young girls to reach their peak bone mass in order to maintain bone health throughout life. A person with high bone mass as a young adult will be more likely to have a higher bone mass later in life. Not enough calcium consumption and physical activity early on could result in a failure to achieve optimum bone mass in adulthood. OSTEOPOROSIS Osteoporosis is a disease of the bones. It is defined as low bone mass with deterioration of bone structure. Osteoporosis leads to an increase risk of fractures with falls. These fractures commonly happen in the wrist, hip, and spine. While men and women of all ages and background can develop osteoporosis, some of the risk factors for osteoporosis are:  Female.  White.  Postmenopausal.  Older adults.  Small in body size.  Eating a diet low in calcium.  Physically inactive.  Smoking.  Use of some medications.  Family history. CALCIUM Calcium is a mineral needed by the body for healthy bones, teeth, and proper function of the heart, muscles, and nerves. The body cannot produce calcium so it must be absorbed through food. Good sources of calcium include:  Dairy products (low fat or nonfat milk, cheese, and yogurt).  Dark green leafy vegetables (bok choy and broccoli).  Calcium fortified foods (orange juice, cereal, bread, soy beverages, and tofu products).  Nuts (almonds). Recommended amounts of calcium vary  for individuals. RECOMMENDED CALCIUM INTAKES Age and Amount in mg per day  Children 1 to 3 years / 700 mg  Children 4 to 8 years / 1,000 mg  Children 9 to 13 years / 1,300 mg  Teens 14 to 18 years / 1,300 mg  Adults 19 to 50 years / 1,000 mg  Adult women 51 to 70 years / 1,200 mg  Adults 71 years and older / 1,200 mg  Pregnant and breastfeeding teens / 1,300 mg  Pregnant and breastfeeding adults / 1,000 mg Vitamin D also plays an important role in healthy bone development. Vitamin D helps in the absorption of calcium. WEIGHT-BEARING PHYSICAL ACTIVITY Regular physical activity has many positive health benefits. Benefits include strong bones. Weight-bearing physical activity early in life is important in reaching peak bone mass. Weight-bearing physical activities cause muscles and bones to work against gravity. Some examples of weight bearing physical activities include:  Walking, jogging, or running.  Field Hockey.  Jumping rope.  Dancing.  Soccer.  Tennis or Racquetball.  Stair climbing.  Basketball.  Hiking.  Weight lifting.  Aerobic fitness classes. Including weight-bearing physical activity into an exercise plan is a great way to keep bones healthy. Adults: Engage in at least 30 minutes of moderate physical activity on most, preferably all, days of the week. Children: Engage in at least 60 minutes of moderate physical activity on most, preferably all, days of the week. FOR MORE INFORMATION [place] Department of Agriculture, Center for Nutrition Policy and Promotion: www.cnpp.usda.gov National Osteoporosis Foundation: www.nof.org Document Released: 03/27/2003 Document Revised: 05/01/2012 Document Reviewed: 06/26/2008 ExitCare Patient Information   2015 [employer]. This information is not intended to replace advice given to you by your health care provider. Make sure you discuss any questions you have with your health care provider.  

## 2014-07-16 ENCOUNTER — Other Ambulatory Visit: Payer: Self-pay

## 2014-07-16 DIAGNOSIS — E785 Hyperlipidemia, unspecified: Secondary | ICD-10-CM

## 2014-07-16 DIAGNOSIS — I10 Essential (primary) hypertension: Secondary | ICD-10-CM

## 2014-07-16 MED ORDER — ATORVASTATIN CALCIUM 20 MG PO TABS: 20.0000 mg | ORAL_TABLET | Freq: Every day | ORAL | Status: DC

## 2014-07-16 MED ORDER — LISINOPRIL-HYDROCHLOROTHIAZIDE 10-12.5 MG PO TABS
1.0000 | ORAL_TABLET | Freq: Every day | ORAL | Status: DC
Start: 2014-07-16 — End: 2014-07-31

## 2014-07-31 ENCOUNTER — Encounter: Payer: Self-pay | Admitting: Nurse Practitioner

## 2014-07-31 ENCOUNTER — Ambulatory Visit (INDEPENDENT_AMBULATORY_CARE_PROVIDER_SITE_OTHER): Payer: Medicare Other | Admitting: Nurse Practitioner

## 2014-07-31 VITALS — BP 135/73 | HR 73 | Temp 97.1°F | Ht 66.0 in | Wt 134.8 lb

## 2014-07-31 DIAGNOSIS — M810 Age-related osteoporosis without current pathological fracture: Secondary | ICD-10-CM | POA: Diagnosis not present

## 2014-07-31 DIAGNOSIS — E785 Hyperlipidemia, unspecified: Secondary | ICD-10-CM

## 2014-07-31 DIAGNOSIS — I10 Essential (primary) hypertension: Secondary | ICD-10-CM | POA: Diagnosis not present

## 2014-07-31 MED ORDER — LISINOPRIL-HYDROCHLOROTHIAZIDE 10-12.5 MG PO TABS: 1.0000 | ORAL_TABLET | Freq: Every day | ORAL | Status: DC

## 2014-07-31 MED ORDER — ATORVASTATIN CALCIUM 20 MG PO TABS: 20.0000 mg | ORAL_TABLET | Freq: Every day | ORAL | Status: DC

## 2014-07-31 NOTE — Progress Notes (Signed)
Subjective:    Patient ID: Laura Acevedo, female    DOB: Aug 23, 1932, 81 y.o.   MRN: [id number]  Patient here today for follow up of chronic medical problems.    Hyperlipidemia This is a chronic problem. The current episode started more than 1 year ago. The problem is controlled. Recent lipid tests were reviewed and are normal. She has no history of diabetes, hypothyroidism or obesity. Pertinent negatives include no chest pain, myalgias or shortness of breath. Current antihyperlipidemic treatment includes statins. The current treatment provides moderate improvement of lipids. Compliance problems include adherence to diet and adherence to exercise.  Risk factors for coronary artery disease include dyslipidemia, hypertension and post-menopausal.  Hypertension This is a chronic problem. The current episode started more than 1 year ago. The problem is unchanged. The problem is controlled. Pertinent negatives include no chest pain, palpitations or shortness of breath. Risk factors for coronary artery disease include dyslipidemia and post-menopausal state. Past treatments include ACE inhibitors and diuretics. The current treatment provides moderate improvement. Compliance problems include diet and exercise.   osteoporosis Prolia every 6 months- doing well no complaints   Review of Systems  Constitutional: Negative for appetite change and unexpected weight change.  HENT: Negative.   Eyes: Negative.   Respiratory: Negative for shortness of breath.   Cardiovascular: Negative for chest pain and palpitations.  Genitourinary: Negative.   Musculoskeletal: Negative.  Negative for myalgias.  Neurological: Negative.   Psychiatric/Behavioral: Negative.   All other systems reviewed and are negative.      Objective:   Physical Exam  Constitutional: She is oriented to person, place, and time. She appears well-developed and well-nourished.  HENT:  Head: Normocephalic.  Right Ear: Hearing, tympanic membrane,  external ear and ear canal normal.  Left Ear: Hearing, tympanic membrane, external ear and ear canal normal.  Nose: Nose normal.  Mouth/Throat: Uvula is midline and oropharynx is clear and moist.  Eyes: Conjunctivae and EOM are normal. Pupils are equal, round, and reactive to light.  Neck: Normal range of motion and full passive range of motion without pain. Neck supple. No JVD present. Carotid bruit is not present. No thyroid mass and no thyromegaly present.  Cardiovascular: Normal rate, normal heart sounds and intact distal pulses.   No murmur heard. Pulmonary/Chest: Effort normal and breath sounds normal. Right breast exhibits no inverted nipple, no mass, no nipple discharge, no skin change and no tenderness. Left breast exhibits no inverted nipple, no mass, no nipple discharge, no skin change and no tenderness.  Abdominal: Soft. Bowel sounds are normal. She exhibits no mass. There is no tenderness.  Genitourinary: No breast swelling, tenderness, discharge or bleeding.  Musculoskeletal: Normal range of motion.  Lymphadenopathy:    She has no cervical adenopathy.  Neurological: She is alert and oriented to person, place, and time.  Skin: Skin is warm and dry.  Psychiatric: She has a normal mood and affect. Her behavior is normal. Judgment and thought content normal.    BP 135/73 mmHg  Pulse 73  Temp(Src) 97.1 F (36.2 C) (Oral)  Ht 5' 6" (1.676 m)  Wt 134 lb 12.8 oz (61.145 kg)  BMI 21.77 kg/m2        Assessment & Plan:   1. Essential hypertension, benign Do not add salt to diet - lisinopril-hydrochlorothiazide (PRINZIDE,ZESTORETIC) 10-12.5 MG per tablet; Take 1 tablet by mouth daily.  Dispense: 90 tablet; Refill: 0 - CMP14+EGFR  2. Osteoporosis Weight bearing exercises  3. Hyperlipidemia Low fat diet - atorvastatin (  LIPITOR) 20 MG tablet; Take 1 tablet (20 mg total) by mouth daily.  Dispense: 90 tablet; Refill: 0 - Lipid panel    Labs pending Health maintenance  reviewed Diet and exercise encouraged Continue all meds Follow up  In 3 months   [place], FNP

## 2014-07-31 NOTE — Patient Instructions (Signed)

## 2014-08-01 LAB — CMP14+EGFR
A/G RATIO: 2 (ref 1.1–2.5)
ALT: 24 IU/L (ref 0–32)
AST: 25 IU/L (ref 0–40)
Albumin: 4.4 g/dL (ref 3.5–4.7)
Alkaline Phosphatase: 51 IU/L (ref 39–117)
BILIRUBIN TOTAL: 1.2 mg/dL (ref 0.0–1.2)
BUN/Creatinine Ratio: 15 (ref 11–26)
BUN: 15 mg/dL (ref 8–27)
CO2: 27 mmol/L (ref 18–29)
CREATININE: 0.99 mg/dL (ref 0.57–1.00)
Calcium: 9.7 mg/dL (ref 8.7–10.3)
Chloride: 98 mmol/L (ref 97–108)
GFR calc non Af Amer: 54 mL/min/{1.73_m2} — ABNORMAL LOW (ref >59)
GFR, EST AFRICAN AMERICAN: 62 mL/min/{1.73_m2} (ref >59)
Globulin, Total: 2.2 g/dL (ref 1.5–4.5)
Glucose: 96 mg/dL (ref 65–99)
POTASSIUM: 5 mmol/L (ref 3.5–5.2)
SODIUM: 141 mmol/L (ref 134–144)
Total Protein: 6.6 g/dL (ref 6.0–8.5)

## 2014-08-01 LAB — LIPID PANEL
CHOLESTEROL TOTAL: 155 mg/dL (ref 100–199)
Chol/HDL Ratio: 1.7 ratio units (ref 0.0–4.4)
HDL: 92 mg/dL (ref >39)
LDL CALC: 47 mg/dL (ref 0–99)
Triglycerides: 81 mg/dL (ref 0–149)
VLDL Cholesterol Cal: 16 mg/dL (ref 5–40)

## 2014-08-06 DIAGNOSIS — L821 Other seborrheic keratosis: Secondary | ICD-10-CM | POA: Diagnosis not present

## 2014-08-06 DIAGNOSIS — D225 Melanocytic nevi of trunk: Secondary | ICD-10-CM | POA: Diagnosis not present

## 2014-08-06 DIAGNOSIS — L814 Other melanin hyperpigmentation: Secondary | ICD-10-CM | POA: Diagnosis not present

## 2014-08-06 DIAGNOSIS — L570 Actinic keratosis: Secondary | ICD-10-CM | POA: Diagnosis not present

## 2014-08-06 DIAGNOSIS — L820 Inflamed seborrheic keratosis: Secondary | ICD-10-CM | POA: Diagnosis not present

## 2014-08-08 ENCOUNTER — Encounter: Payer: Self-pay | Admitting: *Deleted

## 2014-08-23 ENCOUNTER — Ambulatory Visit (INDEPENDENT_AMBULATORY_CARE_PROVIDER_SITE_OTHER): Payer: Medicare Other | Admitting: Pharmacist

## 2014-08-23 ENCOUNTER — Encounter: Payer: Self-pay | Admitting: Pharmacist

## 2014-08-23 VITALS — BP 132/70 | HR 67 | Ht 66.0 in | Wt 134.0 lb

## 2014-08-23 DIAGNOSIS — M810 Age-related osteoporosis without current pathological fracture: Secondary | ICD-10-CM | POA: Diagnosis not present

## 2014-08-23 DIAGNOSIS — Z0000 Encounter for general adult medical examination without abnormal findings: Secondary | ICD-10-CM | POA: Diagnosis not present

## 2014-08-23 MED ORDER — DENOSUMAB 60 MG/ML ~~LOC~~ SOLN
60.0000 mg | Freq: Once | SUBCUTANEOUS | Status: AC
  Administered 2014-08-23: 60 mg via SUBCUTANEOUS

## 2014-08-23 NOTE — Progress Notes (Signed)
Patient ID: Laura Acevedo, female   DOB: [birth year], 81 y.o.   MRN: [id number]    Subjective:   Laura Acevedo is a 81 y.o. female who presents for a subsequent Medicare Annual Wellness Visit and to follow up osteoporosis.  Patient is due to have Prolia adminstered today.    Laura Acevedo is a WF who lives in [place] with her husband.  She a retired Engineer, manufacturing systems (sewing).  She reports that she feels that she is in fairly good health. No acute problems reported today.  Current Medications (verified) Outpatient Encounter Prescriptions as of 08/23/2014  Medication Sig  . atorvastatin (LIPITOR) 20 MG tablet Take 1 tablet (20 mg total) by mouth daily.  . calcium carbonate (OS-CAL) 600 MG TABS Take 600 mg by mouth daily. AT LUNCH  . cholecalciferol (VITAMIN D) 1000 UNITS tablet Take 2,000 Units by mouth daily.   Marland Kitchen denosumab (PROLIA) 60 MG/ML SOLN injection Inject 60 mg into the skin every 6 (six) months. Administer in upper arm, thigh, or abdomen  . lisinopril-hydrochlorothiazide (PRINZIDE,ZESTORETIC) 10-12.5 MG per tablet Take 1 tablet by mouth daily.  . Multiple Vitamins-Minerals (CENTRUM SILVER PO) Take 1 tablet by mouth every morning.  . Multiple Vitamins-Minerals (PRESERVISION AREDS 2 PO) Take by mouth 2 (two) times daily.   No facility-administered encounter medications on file as of 08/23/2014.    Allergies (verified) Review of patient's allergies indicates no known allergies.   History: Past Medical History  Diagnosis Date  . Hypertension   . Osteoporosis   . Colon polyps   . Hyperlipidemia   . Cataract   . 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 Melanoma excision Left     leg  . Cataract extraction    . Foot neuroma surgery Left 2005    Dr Laura Acevedo   Family History  Problem Relation Age of Onset  . Diabetes Mother   . Heart disease Mother   . Heart disease Father   . Heart attack Father   . Colon cancer Neg Hx   . Hypertension Sister   .  Diabetes Brother   . Hypertension Brother   . Cancer Brother     bladder   . Diabetes Brother   . Diabetes Brother    Social History   Occupational History  . Not on file.   Social History Main Topics  . Smoking status: Never Smoker   . Smokeless tobacco: Never Used  . Alcohol Use: No  . Drug Use: No  . Sexual Activity: Yes    Do you feel safe at home?  Yes  Dietary issues and exercise activities: Current Exercise Habits:: The patient does not participate in regular exercise at present (but has an active lifestyle - gardening and canning )  Current Dietary habits:  Patient limits her meat intake.  She is mindful of salt and sugar intake.  She limits portion sizes to maintain a healthy weight.   Objective:    Today's Vitals   08/23/14 1209  BP: 132/70  Pulse: 67  Height: 5\' 6"  (1.676 m)  Weight: 134 lb (60.782 kg)  PainSc: 0-No pain   Body mass index is 21.64 kg/(m^2).  Activities of Daily Living In your present state of health, do you have any difficulty performing the following activities: 08/23/2014 07/31/2014  Hearing? N N  Vision? N N  Difficulty concentrating or making decisions? N N  Walking or climbing stairs? N N  Dressing or bathing? N N  Doing errands, shopping? N N  Preparing Food and eating ? N -  Using the Toilet? N -  In the past six months, have you accidently leaked urine? N - but patient does report that she sometimes has to move quickly to the bathroom or she might have an accident -  Do you have problems with loss of bowel control? N -  Managing your Medications? N -  Managing your Finances? N -  Housekeeping or managing your Housekeeping? N -    Are there smokers in your home (other than you)? No   Cardiac Risk Factors include: advanced age (>16men, >65 women);hypertension;sedentary lifestyle  Depression Screen PHQ 2/9 Scores 08/23/2014 07/31/2014 04/19/2014 02/12/2014  PHQ - 2 Score 0 0 0 0    Fall Risk Fall Risk  08/23/2014 07/31/2014  04/19/2014 02/12/2014 11/05/2013  Falls in the past year? No No No No No    Cognitive Function: MMSE - Mini Mental State Exam 08/23/2014  Orientation to time 5  Orientation to Place 5  Registration 3  Attention/ Calculation 5  Recall 3  Language- name 2 objects 2  Language- repeat 1  Language- follow 3 step command 3  Language- read & follow direction 1  Write a sentence 1  Copy design 1  Total score 30    Immunizations and Health Maintenance Immunization History  Administered Date(s) Administered  . Influenza,inj,Quad PF,36+ Mos 11/22/2012, 11/05/2013  . Pneumococcal Conjugate-13 11/05/2013   Health Maintenance Due  Topic Date Due  . INFLUENZA VACCINE  08/19/2014    Patient Care Team: Laura Pretty, FNP as PCP - General (Nurse Practitioner) Laura Cantor, MD as Consulting Physician (Ophthalmology) Laura Brownie, MD as Consulting Physician (Dermatology) Laura Castle, MD as Consulting Physician (Gastroenterology)  Indicate any recent Medical Services you may have received from other than [facility] providers in the past year (date may be approximate).    Assessment:    Annual Wellness Visit  Osteoporosis  Screening Tests Health Maintenance  Topic Date Due  . INFLUENZA VACCINE  08/19/2014  . ZOSTAVAX  01/31/2015 (Originally 06/06/1994)  . TETANUS/TDAP  01/31/2015 (Originally 11/18/2012)  . PAP SMEAR  09/13/2014  . DEXA SCAN  09/21/2014  . PNA vac Low Risk Adult (2 of 2 - PPSV23) 11/06/2014  . MAMMOGRAM  01/15/2015        Plan:   During the course of the visit Laura Acevedo was educated and counseled about the following appropriate screening and preventive services:   Vaccines to include Pneumoccal, Influenza, Hepatitis B, Td, Zostavax - patient declined Zostavax and tDAP due to cost  Colorectal cancer screening - colonoscopy UTD; Declines FOBT  Cardiovascular disease screening - BP at goal; patient is currently taking statin therapy without any problems.  Last  EKG was 03/20/2013  Diabetes screening - UTD  Bone Denisty / Osteoporosis Screening - next DEXA due 09/2014 - already scheduled  Mammogram - UTD  PAP - UTD  Glaucoma screening /  Eye Exam - UTD; done by Dr Laura Acevedo 09/2013  Nutrition counseling - patient follows a healthy diet already and maintains a healthy weight   Advanced Directives - patient has information packet at home.  Exercise - discussed continued active lifestyle  Recommended Kegel Exercise to help with urinary urgency she experiences at times.   Prolia 60mg  administered SQ in left arm - next due 02/2015  Continue calcium intake with supplement and diet - patient is getting at least 1200mg  daily  Fall prevention  discussed   Patient Instructions (the written plan) were given to the patient.   Cherre Robins, [place] , CPP, CDE  08/23/2014

## 2014-08-23 NOTE — Patient Instructions (Addendum)
Laura Acevedo , Thank you for taking time to come for your Medicare Wellness Visit. I appreciate your ongoing commitment to your health goals. Please review the following plan we discussed and let me know if I can assist you in the future.    This is a list of the screening recommended for you and due dates:  Health Maintenance  Topic Date Due  . Flu Shot  08/19/2014  . Shingles Vaccine  01/31/2015*  . Tetanus Vaccine  01/31/2015*  . Pap Smear  09/13/2014  . DEXA scan (bone density measurement)  09/21/2014  . Pneumonia vaccines (2 of 2 - PPSV23) 11/06/2014  . Mammogram  01/15/2015  *Topic was postponed. The date shown is not the original due date.   Health Maintenance Adopting a healthy lifestyle and getting preventive care can go a long way to promote health and wellness. Talk with your health care provider about what schedule of regular examinations is right for you. This is a good chance for you to check in with your provider about disease prevention and staying healthy. In between checkups, there are plenty of things you can do on your own. Experts have done a lot of research about which lifestyle changes and preventive measures are most likely to keep you healthy. Ask your health care provider for more information. WEIGHT AND DIET  Eat a healthy diet 1. Be sure to include plenty of vegetables, fruits, low-fat dairy products, and lean protein. 2. Do not eat a lot of foods high in solid fats, added sugars, or salt. 3. Get regular exercise. This is one of the most important things you can do for your health. 1. Most adults should exercise for at least 150 minutes each week. The exercise should increase your heart rate and make you sweat (moderate-intensity exercise). 2. Most adults should also do strengthening exercises at least twice a week. This is in addition to the moderate-intensity exercise.  Maintain a healthy weight  Body mass index (BMI) is a measurement that can be used to  identify possible weight problems. It estimates body fat based on height and weight. Your health care provider can help determine your BMI and help you achieve or maintain a healthy weight.  For females 79 years of age and older:   A BMI below 18.5 is considered underweight.  A BMI of 18.5 to 24.9 is normal.  A BMI of 25 to 29.9 is considered overweight.  A BMI of 30 and above is considered obese.  Watch levels of cholesterol and blood lipids  You should start having your blood tested for lipids and cholesterol at 79 years of age blood tested for lipids and cholesterol at 79 years of age, then have this test every 5 years.  You may need to have your cholesterol levels checked more often if:  Your lipid or cholesterol levels are high.  You are older than 79 years of age.  You are at high risk for heart disease.  CANCER SCREENING   Lung Cancer  Lung cancer screening is recommended for adults 79-2 years old who are at high risk for lung cancer because of a history of smoking.  A yearly low-dose CT scan of the lungs is recommended for people who:  Currently smoke.  Have quit within the past 15 years.  Have at least a 30-pack-year history of smoking. A pack year is smoking an average of one pack of cigarettes a day for 1 year.  Yearly screening should continue until it has been 15 years since you quit.  Yearly screening should stop  if you develop a health problem that would prevent you from having lung cancer treatment.  Breast Cancer  Practice breast self-awareness. This means understanding how your breasts normally appear and feel.  It also means doing regular breast self-exams. Let your health care provider know about any changes, no matter how small.  If you are in your 79 or 30s, you should have a clinical breast exam (CBE) by a health care provider every 1-3 years as part of a regular health exam.  If you are 79 or older, have a CBE every year. Also consider having a breast X-ray (mammogram) every year.  If you have a  family history of breast cancer, talk to your health care provider about genetic screening.  If you are at high risk for breast cancer, talk to your health care provider about having an MRI and a mammogram every year.  Breast cancer gene (BRCA) assessment is recommended for women who have family members with BRCA-related cancers. BRCA-related cancers include:  Breast.  Ovarian.  Tubal.  Peritoneal cancers.  Results of the assessment will determine the need for genetic counseling and BRCA1 and BRCA2 testing. Cervical Cancer Routine pelvic examinations to screen for cervical cancer are no longer recommended for nonpregnant women who are considered low risk for cancer of the pelvic organs (ovaries, uterus, and vagina) and who do not have symptoms. A pelvic examination may be necessary if you have symptoms including those associated with pelvic infections. Ask your health care provider if a screening pelvic exam is right for you.   The Pap test is the screening test for cervical cancer for women who are considered at risk.  If you had a hysterectomy for a problem that was not cancer or a condition that could lead to cancer, then you no longer need Pap tests.  If you are older than 79 years, and you have had normal Pap tests for the past 10 years, you no longer need to have Pap tests.  If you have had past treatment for cervical cancer or a condition that could lead to cancer, you need Pap tests and screening for cancer for at least 20 years after your treatment.  If you no longer get a Pap test, assess your risk factors if they change (such as having a new sexual partner). This can affect whether you should start being screened again.  Some women have medical problems that increase their chance of getting cervical cancer. If this is the case for you, your health care provider may recommend more frequent screening and Pap tests.  The human papillomavirus (HPV) test is another test that may  be used for cervical cancer screening. The HPV test looks for the virus that can cause cell changes in the cervix. The cells collected during the Pap test can be tested for HPV.  The HPV test can be used to screen women 79 years of age and older. Getting tested for HPV can extend the interval between normal Pap tests from three to five years.  An HPV test also should be used to screen women of any age who have unclear Pap test results.  After 79 years of age, women should have HPV testing as often as Pap tests.  Colorectal Cancer  This type of cancer can be detected and often prevented.  Routine colorectal cancer screening usually begins at 79 years of age and continues through 79 years of age.  Your health care provider may recommend screening at an earlier age if  you have risk factors for colon cancer.  Your health care provider may also recommend using home test kits to check for hidden blood in the stool.  A small camera at the end of a tube can be used to examine your colon directly (sigmoidoscopy or colonoscopy). This is done to check for the earliest forms of colorectal cancer.  Routine screening usually begins at age 74.  Direct examination of the colon should be repeated every 5-10 years through 79 years of age. However, you may need to be screened more often if early forms of precancerous polyps or small growths are found. Skin Cancer  Check your skin from head to toe regularly.  Tell your health care provider about any new moles or changes in moles, especially if there is a change in a mole's shape or color.  Also tell your health care provider if you have a mole that is larger than the size of a pencil eraser.  Always use sunscreen. Apply sunscreen liberally and repeatedly throughout the day.  Protect yourself by wearing long sleeves, pants, a wide-brimmed hat, and sunglasses whenever you are outside. HEART DISEASE, DIABETES, AND HIGH BLOOD PRESSURE   Have your blood  pressure checked at least every 1-2 years. High blood pressure causes heart disease and increases the risk of stroke.  If you are between 45 years and 48 years old, ask your health care provider if you should take aspirin to prevent strokes.  Have regular diabetes screenings. This involves taking a blood sample to check your fasting blood sugar level.  If you are at a normal weight and have a low risk for diabetes, have this test once every three years after 79 years of age.  If you are overweight and have a high risk for diabetes, consider being tested at a younger age or more often. PREVENTING INFECTION  Hepatitis B  If you have a higher risk for hepatitis B, you should be screened for this virus. You are considered at high risk for hepatitis B if:  You were born in a country where hepatitis B is common. Ask your health care provider which countries are considered high risk.  Your parents were born in a high-risk country, and you have not been immunized against hepatitis B (hepatitis B vaccine).  You have HIV or AIDS.  You use needles to inject street drugs.  You live with someone who has hepatitis B.  You have had sex with someone who has hepatitis B.  You get hemodialysis treatment.  You take certain medicines for conditions, including cancer, organ transplantation, and autoimmune conditions. Hepatitis C  Blood testing is recommended for:  Everyone born from [birth year] through 1963.  Anyone with known risk factors for hepatitis C. Sexually transmitted infections (STIs)  You should be screened for sexually transmitted infections (STIs) including gonorrhea and chlamydia if:  You are sexually active and are younger than 79 years of age.  You are older than 79 years of age and your health care provider tells you that you are at risk for this type of infection.  Your sexual activity has changed since you were last screened and you are at an increased risk for chlamydia or  gonorrhea. Ask your health care provider if you are at risk.  If you do not have HIV, but are at risk, it may be recommended that you take a prescription medicine daily to prevent HIV infection. This is called pre-exposure prophylaxis (PrEP). You are considered at risk if:  You  are sexually active and do not regularly use condoms or know the HIV status of your partner(s).  You take drugs by injection.  You are sexually active with a partner who has HIV. Talk with your health care provider about whether you are at high risk of being infected with HIV. If you choose to begin PrEP, you should first be tested for HIV. You should then be tested every 3 months for as long as you are taking PrEP.  PREGNANCY   If you are premenopausal and you may become pregnant, ask your health care provider about preconception counseling.  If you may become pregnant, take 400 to 800 micrograms (mcg) of folic acid every day.  If you want to prevent pregnancy, talk to your health care provider about birth control (contraception). OSTEOPOROSIS AND MENOPAUSE   Osteoporosis is a disease in which the bones lose minerals and strength with aging. This can result in serious bone fractures. Your risk for osteoporosis can be identified using a bone density scan.  If you are 36 years of age or older, or if you are at risk for osteoporosis and fractures, ask your health care provider if you should be screened.  Ask your health care provider whether you should take a calcium or vitamin D supplement to lower your risk for osteoporosis.  Menopause may have certain physical symptoms and risks.  Hormone replacement therapy may reduce some of these symptoms and risks. Talk to your health care provider about whether hormone replacement therapy is right for you.  HOME CARE INSTRUCTIONS   Schedule regular health, dental, and eye exams.  Stay current with your immunizations.   Do not use any tobacco products including  cigarettes, chewing tobacco, or electronic cigarettes.  If you are pregnant, do not drink alcohol.  If you are breastfeeding, limit how much and how often you drink alcohol.  Limit alcohol intake to no more than 1 drink per day for nonpregnant women. One drink equals 12 ounces of beer, 5 ounces of wine, or 1 ounces of hard liquor.  Do not use street drugs.  Do not share needles.  Ask your health care provider for help if you need support or information about quitting drugs.  Tell your health care provider if you often feel depressed.  Tell your health care provider if you have ever been abused or do not feel safe at home. Document Released: 07/20/2010 Document Revised: 05/21/2013 Document Reviewed: 12/06/2012 [facility] Patient Information 2015 [place], [place]. This information is not intended to replace advice given to you by your health care provider. Make sure you discuss any questions you have with your health care provider.  Kegel Exercises The goal of Kegel exercises is to isolate and exercise your pelvic floor muscles. These muscles act as a hammock that supports the rectum, vagina, small intestine, and uterus. As the muscles weaken, the hammock sags and these organs are displaced from their normal positions. Kegel exercises can strengthen your pelvic floor muscles and help you to improve bladder and bowel control, improve sexual response, and help reduce many problems and some discomfort during pregnancy. Kegel exercises can be done anywhere and at any time. HOW TO PERFORM KEGEL EXERCISES 4. Locate your pelvic floor muscles. To do this, squeeze (contract) the muscles that you use when you try to stop the flow of urine. You will feel a tightness in the vaginal area (women) and a tight lift in the rectal area (men and women). 5. When you begin, contract your pelvic  muscles tight for 2-5 seconds, then relax them for 2-5 seconds. This is one set. Do 4-5 sets with a short pause in  between. 6. Contract your pelvic muscles for 8-10 seconds, then relax them for 8-10 seconds. Do 4-5 sets. If you cannot contract your pelvic muscles for 8-10 seconds, try 5-7 seconds and work your way up to 8-10 seconds. Your goal is 4-5 sets of 10 contractions each day. Keep your stomach, buttocks, and legs relaxed during the exercises. Perform sets of both short and long contractions. Vary your positions. Perform these contractions 3-4 times per day. Perform sets while you are:   Lying in bed in the morning.  Standing at lunch.  Sitting in the late afternoon.  Lying in bed at night. You should do 40-50 contractions per day. Do not perform more Kegel exercises per day than recommended. Overexercising can cause muscle fatigue. Continue these exercises for for at least 15-20 weeks or as directed by your caregiver. Document Released: 12/22/2011 Document Reviewed: 12/22/2011 [facility] Patient Information 2015 [place]. This information is not intended to replace advice given to you by your health care provider. Make sure you discuss any questions you have with your health care provider.

## 2014-09-25 ENCOUNTER — Ambulatory Visit (INDEPENDENT_AMBULATORY_CARE_PROVIDER_SITE_OTHER): Payer: Medicare Other | Admitting: Pharmacist

## 2014-09-25 ENCOUNTER — Ambulatory Visit (INDEPENDENT_AMBULATORY_CARE_PROVIDER_SITE_OTHER): Payer: Medicare Other

## 2014-09-25 ENCOUNTER — Encounter: Payer: Self-pay | Admitting: Pharmacist

## 2014-09-25 VITALS — Ht 66.0 in | Wt 135.0 lb

## 2014-09-25 DIAGNOSIS — M810 Age-related osteoporosis without current pathological fracture: Secondary | ICD-10-CM

## 2014-09-25 NOTE — Patient Instructions (Signed)
Fall Prevention and Home Safety Falls cause injuries and can affect all age groups. It is possible to use preventive measures to significantly decrease the likelihood of falls. There are many simple measures which can make your home safer and prevent falls. OUTDOORS  Repair cracks and edges of walkways and driveways.  Remove high doorway thresholds.  Trim shrubbery on the main path into your home.  Have good outside lighting.  Clear walkways of tools, rocks, debris, and clutter.  Check that handrails are not broken and are securely fastened. Both sides of steps should have handrails.  Have leaves, snow, and ice cleared regularly.  Use sand or salt on walkways during winter months.  In the garage, clean up grease or oil spills. BATHROOM  Install night lights.  Install grab bars by the toilet and in the tub and shower.  Use non-skid mats or decals in the tub or shower.  Place a plastic non-slip stool in the shower to sit on, if needed.  Keep floors dry and clean up all water on the floor immediately.  Remove soap buildup in the tub or shower on a regular basis.  Secure bath mats with non-slip, double-sided rug tape.  Remove throw rugs and tripping hazards from the floors. BEDROOMS  Install night lights.  Make sure a bedside light is easy to reach.  Do not use oversized bedding.  Keep a telephone by your bedside.  Have a firm chair with side arms to use for getting dressed.  Remove throw rugs and tripping hazards from the floor. KITCHEN  Keep handles on pots and pans turned toward the center of the stove. Use back burners when possible.  Clean up spills quickly and allow time for drying.  Avoid walking on wet floors.  Avoid hot utensils and knives.  Position shelves so they are not too high or low.  Place commonly used objects within easy reach.  If necessary, use a sturdy step stool with a grab bar when reaching.  Keep electrical cables out of the  way.  Do not use floor polish or wax that makes floors slippery. If you must use wax, use non-skid floor wax.  Remove throw rugs and tripping hazards from the floor. STAIRWAYS  Never leave objects on stairs.  Place handrails on both sides of stairways and use them. Fix any loose handrails. Make sure handrails on both sides of the stairways are as long as the stairs.  Check carpeting to make sure it is firmly attached along stairs. Make repairs to worn or loose carpet promptly.  Avoid placing throw rugs at the top or bottom of stairways, or properly secure the rug with carpet tape to prevent slippage. Get rid of throw rugs, if possible.  Have an electrician put in a light switch at the top and bottom of the stairs. OTHER FALL PREVENTION TIPS  Wear low-heel or rubber-soled shoes that are supportive and fit well. Wear closed toe shoes.  When using a stepladder, make sure it is fully opened and both spreaders are firmly locked. Do not climb a closed stepladder.  Add color or contrast paint or tape to grab bars and handrails in your home. Place contrasting color strips on first and last steps.  Learn and use mobility aids as needed. Install an electrical emergency response system.  Turn on lights to avoid dark areas. Replace light bulbs that burn out immediately. Get light switches that glow.  Arrange furniture to create clear pathways. Keep furniture in the same place.    Firmly attach carpet with non-skid or double-sided tape.  Eliminate uneven floor surfaces.  Select a carpet pattern that does not visually hide the edge of steps.  Be aware of all pets. OTHER HOME SAFETY TIPS  Set the water temperature for 120 F (48.8 C).  Keep emergency numbers on or near the telephone.  Keep smoke detectors on every level of the home and near sleeping areas. Document Released: 12/25/2001 Document Revised: 07/06/2011 Document Reviewed: 03/26/2011 [employer] Patient Information 2015  [employer]. This information is not intended to replace advice given to you by your health care provider. Make sure you discuss any questions you have with your health care provider.                Exercise for Strong Bones  Exercise is important to build and maintain strong bones / bone density.  There are 2 types of exercises that are important to building and maintaining strong bones:  Weight- bearing and muscle-stregthening.  Weight-bearing Exercises  These exercises include activities that make you move against gravity while staying upright. Weight-bearing exercises can be high-impact or low-impact.  High-impact weight-bearing exercises help build bones and keep them strong. If you have broken a bone due to osteoporosis or are at risk of breaking a bone, you may need to avoid high-impact exercises. If you're not sure, you should check with your healthcare provider.  Examples of high-impact weight-bearing exercises are: Dancing  Doing high-impact aerobics  Hiking  Jogging/running  Jumping Rope  Stair climbing  Tennis  Low-impact weight-bearing exercises can also help keep bones strong and are a safe alternative if you cannot do high-impact exercises.   Examples of low-impact weight-bearing exercises are: Using elliptical training machines  Doing low-impact aerobics  Using stair-step machines  Fast walking on a treadmill or outside   Muscle-Strengthening Exercises These exercises include activities where you move your body, a weight or some other resistance against gravity. They are also known as resistance exercises and include: Lifting weights  Using elastic exercise bands  Using weight machines  Lifting your own body weight  Functional movements, such as standing and rising up on your toes  Yoga and Pilates can also improve strength, balance and flexibility. However, certain positions may not be safe for people with osteoporosis or those at increased risk of broken  bones. For example, exercises that have you bend forward may increase the chance of breaking a bone in the spine.   Non-Impact Exercises There are other types of exercises that can help prevent falls.  Non-impact exercises can help you to improve balance, posture and how well you move in everyday activities. Some of these exercises include: Balance exercises that strengthen your legs and test your balance, such as Tai Chi, can decrease your risk of falls.  Posture exercises that improve your posture and reduce rounded or "sloping" shoulders can help you decrease the chance of breaking a bone, especially in the spine.  Functional exercises that improve how well you move can help you with everyday activities and decrease your chance of falling and breaking a bone. For example, if you have trouble getting up from a chair or climbing stairs, you should do these activities as exercises.   **A physical therapist can teach you balance, posture and functional exercises. He/she can also help you learn which exercises are safe and appropriate for you.  [facility] has a physical therapy office in [place] in front of our office and referrals can be made for assessments   and treatment as needed and strength and balance training.  If you would like to have an assessment with [facility] and our physical therapy team please let a nurse or provider know.    

## 2014-09-25 NOTE — Progress Notes (Signed)
Patient ID: Laura Acevedo, female   DOB: Mar 27, 1932, 81 y.o.   MRN: [id number]  [facility] Current Height:        Max Lifetime Height:  5' 7.5"  Current Weight:         Ethnicity:Caucasian    HPI: Does pt already have a diagnosis of:   Osteoporosis?  Yes  Back Pain?  No       Kyphosis?  No Prior fracture?  No Med(s) for Osteoporosis/Osteopenia:  prolia 60mg  q 6 months - last injection was May 2014.   Due next 11/2012. Med(s) previously tried for Osteoporosis/Osteopenia:  Fosamax - stopped because had taken for 12 years.                                                             PMH: Age at menopause:  43's Hysterectomy?  No Oophorectomy?  No HRT? No Steroid Use?  No Thyroid med?  No History of cancer?  Yes - melanoma / skin cancer History of digestive disorders (ie Crohn's)?  No Current or previous eating disorders?  No Last Vitamin D Result:  49.4 (09/20/2012) Last GFR Result:  54 (07/31/2014)   FH/SH: Family history of osteoporosis?  No Parent with history of hip fracture?  No Family history of breast cancer?  No Exercise?  No - active lifestyle / regular gardening Smoking?  No Alcohol?  No    Calcium Assessment Calcium Intake  # of servings/day  Calcium mg  Milk (8 oz) 2  x  300  = 600mg   Yogurt (4 oz) 0 x  200 = 0  Cheese (1 oz) 0 x  200 = 0  Other Calcium sources   250mg   Ca supplement mvi daily and Calcium daily = 1000mg    Estimated calcium intake per day 1850mg     DEXA Results Date of Test T-Score for AP Spine L1-L4 T-Score for Total Left Hip T-Score for Total Right Hip  09/25/2014 -2.7 -1.8 -1.9  09/20/2012 -2.6 -2.1 -1.9  09/09/2010 -3.0 -2.5 -2.2  03/08/2007 -3.1 -2.2 -2.0  12/28/2004 -3.1 -2.3 -2.1   Assessment: Osteoporosis with improved BMD  Recom mendations: 1.  Continue prolia 60mg  q 6 months 2.  Continue calcium 1200mg  daily through supplementation or diet.  3.  continue weight bearing exercise - 30 minutes at least 4 days  per  week.   4.  Counseled and educated about fall risk and prevention.  Recheck DEXA:  2 years  Time spent counseling patient:  30 minutes   Cherre Robins, PharmD, CPP

## 2014-10-29 ENCOUNTER — Other Ambulatory Visit: Payer: Self-pay | Admitting: Nurse Practitioner

## 2014-10-30 ENCOUNTER — Ambulatory Visit (INDEPENDENT_AMBULATORY_CARE_PROVIDER_SITE_OTHER): Payer: Medicare Other

## 2014-10-30 DIAGNOSIS — Z23 Encounter for immunization: Secondary | ICD-10-CM | POA: Diagnosis not present

## 2014-11-07 DIAGNOSIS — H353131 Nonexudative age-related macular degeneration, bilateral, early dry stage: Secondary | ICD-10-CM | POA: Diagnosis not present

## 2014-11-15 ENCOUNTER — Ambulatory Visit (INDEPENDENT_AMBULATORY_CARE_PROVIDER_SITE_OTHER): Payer: Medicare Other | Admitting: Nurse Practitioner

## 2014-11-15 ENCOUNTER — Encounter: Payer: Self-pay | Admitting: Nurse Practitioner

## 2014-11-15 VITALS — BP 128/69 | HR 76 | Temp 97.1°F | Ht 66.0 in | Wt 133.0 lb

## 2014-11-15 DIAGNOSIS — M810 Age-related osteoporosis without current pathological fracture: Secondary | ICD-10-CM

## 2014-11-15 DIAGNOSIS — E785 Hyperlipidemia, unspecified: Secondary | ICD-10-CM | POA: Diagnosis not present

## 2014-11-15 DIAGNOSIS — I10 Essential (primary) hypertension: Secondary | ICD-10-CM | POA: Diagnosis not present

## 2014-11-15 MED ORDER — LISINOPRIL-HYDROCHLOROTHIAZIDE 10-12.5 MG PO TABS: ORAL_TABLET | ORAL | Status: DC

## 2014-11-15 MED ORDER — ATORVASTATIN CALCIUM 20 MG PO TABS: ORAL_TABLET | ORAL | Status: DC

## 2014-11-15 NOTE — Patient Instructions (Signed)
Fall Prevention in the Home  Falls can cause injuries and can affect people from all age groups. There are many simple things that you can do to make your home safe and to help prevent falls. WHAT CAN I DO ON THE OUTSIDE OF MY HOME?  Regularly repair the edges of walkways and driveways and fix any cracks.  Remove high doorway thresholds.  Trim any shrubbery on the main path into your home.  Use bright outdoor lighting.  Clear walkways of debris and clutter, including tools and rocks.  Regularly check that handrails are securely fastened and in good repair. Both sides of any steps should have handrails.  Install guardrails along the edges of any raised decks or porches.  Have leaves, snow, and ice cleared regularly.  Use sand or salt on walkways during winter months.  In the garage, clean up any spills right away, including grease or oil spills. WHAT CAN I DO IN THE BATHROOM?  Use night lights.  Install grab bars by the toilet and in the tub and shower. Do not use towel bars as grab bars.  Use non-skid mats or decals on the floor of the tub or shower.  If you need to sit down while you are in the shower, use a plastic, non-slip stool..  Keep the floor dry. Immediately clean up any water that spills on the floor.  Remove soap buildup in the tub or shower on a regular basis.  Attach bath mats securely with double-sided non-slip rug tape.  Remove throw rugs and other tripping hazards from the floor. WHAT CAN I DO IN THE BEDROOM?  Use night lights.  Make sure that a bedside light is easy to reach.  Do not use oversized bedding that drapes onto the floor.  Have a firm chair that has side arms to use for getting dressed.  Remove throw rugs and other tripping hazards from the floor. WHAT CAN I DO IN THE KITCHEN?   Clean up any spills right away.  Avoid walking on wet floors.  Place frequently used items in easy-to-reach places.  If you need to reach for something  above you, use a sturdy step stool that has a grab bar.  Keep electrical cables out of the way.  Do not use floor polish or wax that makes floors slippery. If you have to use wax, make sure that it is non-skid floor wax.  Remove throw rugs and other tripping hazards from the floor. WHAT CAN I DO IN THE STAIRWAYS?  Do not leave any items on the stairs.  Make sure that there are handrails on both sides of the stairs. Fix handrails that are broken or loose. Make sure that handrails are as long as the stairways.  Check any carpeting to make sure that it is firmly attached to the stairs. Fix any carpet that is loose or worn.  Avoid having throw rugs at the top or bottom of stairways, or secure the rugs with carpet tape to prevent them from moving.  Make sure that you have a light switch at the top of the stairs and the bottom of the stairs. If you do not have them, have them installed. WHAT ARE SOME OTHER FALL PREVENTION TIPS?  Wear closed-toe shoes that fit well and support your feet. Wear shoes that have rubber soles or low heels.  When you use a stepladder, make sure that it is completely opened and that the sides are firmly locked. Have someone hold the ladder while you   are using it. Do not climb a closed stepladder.  Add color or contrast paint or tape to grab bars and handrails in your home. Place contrasting color strips on the first and last steps.  Use mobility aids as needed, such as canes, walkers, scooters, and crutches.  Turn on lights if it is dark. Replace any light bulbs that burn out.  Set up furniture so that there are clear paths. Keep the furniture in the same spot.  Fix any uneven floor surfaces.  Choose a carpet design that does not hide the edge of steps of a stairway.  Be aware of any and all pets.  Review your medicines with your healthcare provider. Some medicines can cause dizziness or changes in blood pressure, which increase your risk of falling. Talk  with your health care provider about other ways that you can decrease your risk of falls. This may include working with a physical therapist or trainer to improve your strength, balance, and endurance.   This information is not intended to replace advice given to you by your health care provider. Make sure you discuss any questions you have with your health care provider.   Document Released: 12/25/2001 Document Revised: 05/21/2014 Document Reviewed: 02/08/2014 Elsevier Interactive Patient Education 2016 [employer]  

## 2014-11-15 NOTE — Progress Notes (Signed)
Subjective:    Patient ID: Laura Acevedo, female    DOB: 1932/09/01, 81 y.o.   MRN: [id number]  Patient here today for follow up of chronic medical problems. Doing well today without complaints.   Hyperlipidemia This is a chronic problem. The current episode started more than 1 year ago. The problem is controlled. Recent lipid tests were reviewed and are normal. She has no history of diabetes, hypothyroidism or obesity. Pertinent negatives include no chest pain, myalgias or shortness of breath. Current antihyperlipidemic treatment includes statins. The current treatment provides moderate improvement of lipids. Compliance problems include adherence to diet and adherence to exercise.  Risk factors for coronary artery disease include dyslipidemia, hypertension and post-menopausal.  Hypertension This is a chronic problem. The current episode started more than 1 year ago. The problem is unchanged. The problem is controlled. Pertinent negatives include no chest pain, palpitations or shortness of breath. Risk factors for coronary artery disease include dyslipidemia and post-menopausal state. Past treatments include ACE inhibitors and diuretics. The current treatment provides moderate improvement. Compliance problems include diet and exercise.   osteoporosis Prolia every 6 months- doing well no complaints   Review of Systems  Constitutional: Negative for appetite change and unexpected weight change.  HENT: Negative.   Eyes: Negative.   Respiratory: Negative for shortness of breath.   Cardiovascular: Negative for chest pain and palpitations.  Genitourinary: Negative.   Musculoskeletal: Negative.  Negative for myalgias.  Neurological: Negative.   Psychiatric/Behavioral: Negative.   All other systems reviewed and are negative.      Objective:   Physical Exam  Constitutional: She is oriented to person, place, and time. She appears well-developed and well-nourished.  HENT:  Head: Normocephalic.   Right Ear: Hearing, tympanic membrane, external ear and ear canal normal.  Left Ear: Hearing, tympanic membrane, external ear and ear canal normal.  Nose: Nose normal.  Mouth/Throat: Uvula is midline and oropharynx is clear and moist.  Eyes: Conjunctivae and EOM are normal. Pupils are equal, round, and reactive to light.  Neck: Normal range of motion and full passive range of motion without pain. Neck supple. No JVD present. Carotid bruit is not present. No thyroid mass and no thyromegaly present.  Cardiovascular: Normal rate, normal heart sounds and intact distal pulses.   No murmur heard. Pulmonary/Chest: Effort normal and breath sounds normal. Right breast exhibits no inverted nipple, no mass, no nipple discharge, no skin change and no tenderness. Left breast exhibits no inverted nipple, no mass, no nipple discharge, no skin change and no tenderness.  Abdominal: Soft. Bowel sounds are normal. She exhibits no mass. There is no tenderness.  Genitourinary: No breast swelling, tenderness, discharge or bleeding.  Musculoskeletal: Normal range of motion.  Lymphadenopathy:    She has no cervical adenopathy.  Neurological: She is alert and oriented to person, place, and time.  Skin: Skin is warm and dry.  Psychiatric: She has a normal mood and affect. Her behavior is normal. Judgment and thought content normal.    BP 128/69 mmHg  Pulse 76  Temp(Src) 97.1 F (36.2 C) (Oral)  Ht _0  (1.676 m)  Wt 133 lb (60.328 kg)  BMI 21.48 kg/m2        Assessment & Plan:   1. Essential hypertension, benign Do not add salt to diet - lisinopril-hydrochlorothiazide (PRINZIDE,ZESTORETIC) 10-12.5 MG tablet; Take 1 tablet by mouth  daily  Dispense: 90 tablet; Refill: 1 - CMP14+EGFR  2. Osteoporosis Weight bearing exercises  3. Hyperlipidemia Low fat diet  -  atorvastatin (LIPITOR) 20 MG tablet; Take 1 tablet by mouth  daily  Dispense: 90 tablet; Refill: 1 - Lipid panel   Pneumonia 23 vaccine  today Labs pending Health maintenance reviewed Diet and exercise encouraged Continue all meds Follow up  In 3 month [place], FNP

## 2014-11-16 LAB — CMP14+EGFR
ALBUMIN: 4.4 g/dL (ref 3.5–4.7)
ALT: 37 IU/L — ABNORMAL HIGH (ref 0–32)
AST: 41 IU/L — ABNORMAL HIGH (ref 0–40)
Albumin/Globulin Ratio: 1.9 (ref 1.1–2.5)
Alkaline Phosphatase: 58 IU/L (ref 39–117)
BILIRUBIN TOTAL: 1.1 mg/dL (ref 0.0–1.2)
BUN / CREAT RATIO: 21 (ref 11–26)
BUN: 21 mg/dL (ref 8–27)
CO2: 23 mmol/L (ref 18–29)
CREATININE: 1 mg/dL (ref 0.57–1.00)
Calcium: 10.5 mg/dL — ABNORMAL HIGH (ref 8.7–10.3)
Chloride: 96 mmol/L — ABNORMAL LOW (ref 97–106)
GFR calc non Af Amer: 53 mL/min/{1.73_m2} — ABNORMAL LOW (ref >59)
GFR, EST AFRICAN AMERICAN: 61 mL/min/{1.73_m2} (ref >59)
GLUCOSE: 94 mg/dL (ref 65–99)
Globulin, Total: 2.3 g/dL (ref 1.5–4.5)
Potassium: 4.2 mmol/L (ref 3.5–5.2)
Sodium: 140 mmol/L (ref 136–144)
TOTAL PROTEIN: 6.7 g/dL (ref 6.0–8.5)

## 2014-11-16 LAB — LIPID PANEL
Chol/HDL Ratio: 1.8 ratio units (ref 0.0–4.4)
Cholesterol, Total: 164 mg/dL (ref 100–199)
HDL: 92 mg/dL (ref >39)
LDL CALC: 53 mg/dL (ref 0–99)
Triglycerides: 95 mg/dL (ref 0–149)
VLDL CHOLESTEROL CAL: 19 mg/dL (ref 5–40)

## 2014-11-19 DIAGNOSIS — Z23 Encounter for immunization: Secondary | ICD-10-CM

## 2014-11-19 NOTE — Addendum Note (Signed)
Addended by: Rolena Infante on: 11/19/2014 11:39 AM   Modules accepted: Orders

## 2014-12-03 NOTE — Progress Notes (Signed)
Patient ID: Laura Acevedo, female   DOB: 1932/02/05, 81 y.o.   MRN: [id number] Correction to adminstration of Prolia - last injection received 08/2014 - next due 02/2015

## 2015-01-08 ENCOUNTER — Encounter: Payer: Self-pay | Admitting: *Deleted

## 2015-02-25 ENCOUNTER — Encounter: Payer: Self-pay | Admitting: Nurse Practitioner

## 2015-02-25 ENCOUNTER — Ambulatory Visit (INDEPENDENT_AMBULATORY_CARE_PROVIDER_SITE_OTHER): Payer: Medicare Other | Admitting: Nurse Practitioner

## 2015-02-25 VITALS — BP 133/72 | HR 73 | Temp 96.8°F | Ht 66.0 in | Wt 133.0 lb

## 2015-02-25 DIAGNOSIS — E785 Hyperlipidemia, unspecified: Secondary | ICD-10-CM | POA: Diagnosis not present

## 2015-02-25 DIAGNOSIS — M810 Age-related osteoporosis without current pathological fracture: Secondary | ICD-10-CM | POA: Diagnosis not present

## 2015-02-25 DIAGNOSIS — K1370 Unspecified lesions of oral mucosa: Secondary | ICD-10-CM | POA: Diagnosis not present

## 2015-02-25 DIAGNOSIS — I10 Essential (primary) hypertension: Secondary | ICD-10-CM

## 2015-02-25 MED ORDER — LISINOPRIL-HYDROCHLOROTHIAZIDE 10-12.5 MG PO TABS: ORAL_TABLET | ORAL | Status: DC

## 2015-02-25 MED ORDER — DENOSUMAB 60 MG/ML ~~LOC~~ SOLN
60.0000 mg | Freq: Once | SUBCUTANEOUS | Status: AC
  Administered 2015-02-25: 60 mg via SUBCUTANEOUS

## 2015-02-25 MED ORDER — ATORVASTATIN CALCIUM 20 MG PO TABS: ORAL_TABLET | ORAL | Status: DC

## 2015-02-25 NOTE — Patient Instructions (Signed)
Health Maintenance, Female Adopting a healthy lifestyle and getting preventive care can go a long way to promote health and wellness. Talk with your health care provider about what schedule of regular examinations is right for you. This is a good chance for you to check in with your provider about disease prevention and staying healthy. In between checkups, there are plenty of things you can do on your own. Experts have done a lot of research about which lifestyle changes and preventive measures are most likely to keep you healthy. Ask your health care provider for more information. WEIGHT AND DIET  Eat a healthy diet  Be sure to include plenty of vegetables, fruits, low-fat dairy products, and lean protein.  Do not eat a lot of foods high in solid fats, added sugars, or salt.  Get regular exercise. This is one of the most important things you can do for your health.  Most adults should exercise for at least 150 minutes each week. The exercise should increase your heart rate and make you sweat (moderate-intensity exercise).  Most adults should also do strengthening exercises at least twice a week. This is in addition to the moderate-intensity exercise.  Maintain a healthy weight  Body mass index (BMI) is a measurement that can be used to identify possible weight problems. It estimates body fat based on height and weight. Your health care provider can help determine your BMI and help you achieve or maintain a healthy weight.  For females 20 years of age and older:   A BMI below 18.5 is considered underweight.  A BMI of 18.5 to 24.9 is normal.  A BMI of 25 to 29.9 is considered overweight.  A BMI of 30 and above is considered obese.  Watch levels of cholesterol and blood lipids  You should start having your blood tested for lipids and cholesterol at 80 years of age, then have this test every 5 years.  You may need to have your cholesterol levels checked more often if:  Your lipid  or cholesterol levels are high.  You are older than 80 years of age.  You are at high risk for heart disease.  CANCER SCREENING   Lung Cancer  Lung cancer screening is recommended for adults 55-80 years old who are at high risk for lung cancer because of a history of smoking.  A yearly low-dose CT scan of the lungs is recommended for people who:  Currently smoke.  Have quit within the past 15 years.  Have at least a 30-pack-year history of smoking. A pack year is smoking an average of one pack of cigarettes a day for 1 year.  Yearly screening should continue until it has been 15 years since you quit.  Yearly screening should stop if you develop a health problem that would prevent you from having lung cancer treatment.  Breast Cancer  Practice breast self-awareness. This means understanding how your breasts normally appear and feel.  It also means doing regular breast self-exams. Let your health care provider know about any changes, no matter how small.  If you are in your 20s or 30s, you should have a clinical breast exam (CBE) by a health care provider every 1-3 years as part of a regular health exam.  If you are 40 or older, have a CBE every year. Also consider having a breast X-ray (mammogram) every year.  If you have a family history of breast cancer, talk to your health care provider about genetic screening.  If you   are at high risk for breast cancer, talk to your health care provider about having an MRI and a mammogram every year.  Breast cancer gene (BRCA) assessment is recommended for women who have family members with BRCA-related cancers. BRCA-related cancers include:  Breast.  Ovarian.  Tubal.  Peritoneal cancers.  Results of the assessment will determine the need for genetic counseling and BRCA1 and BRCA2 testing. Cervical Cancer Your health care provider may recommend that you be screened regularly for cancer of the pelvic organs (ovaries, uterus, and  vagina). This screening involves a pelvic examination, including checking for microscopic changes to the surface of your cervix (Pap test). You may be encouraged to have this screening done every 3 years, beginning at age 21.  For women ages 30-65, health care providers may recommend pelvic exams and Pap testing every 3 years, or they may recommend the Pap and pelvic exam, combined with testing for human papilloma virus (HPV), every 5 years. Some types of HPV increase your risk of cervical cancer. Testing for HPV may also be done on women of any age with unclear Pap test results.  Other health care providers may not recommend any screening for nonpregnant women who are considered low risk for pelvic cancer and who do not have symptoms. Ask your health care provider if a screening pelvic exam is right for you.  If you have had past treatment for cervical cancer or a condition that could lead to cancer, you need Pap tests and screening for cancer for at least 20 years after your treatment. If Pap tests have been discontinued, your risk factors (such as having a new sexual partner) need to be reassessed to determine if screening should resume. Some women have medical problems that increase the chance of getting cervical cancer. In these cases, your health care provider may recommend more frequent screening and Pap tests. Colorectal Cancer  This type of cancer can be detected and often prevented.  Routine colorectal cancer screening usually begins at 80 years of age and continues through 80 years of age.  Your health care provider may recommend screening at an earlier age if you have risk factors for colon cancer.  Your health care provider may also recommend using home test kits to check for hidden blood in the stool.  A small camera at the end of a tube can be used to examine your colon directly (sigmoidoscopy or colonoscopy). This is done to check for the earliest forms of colorectal  cancer.  Routine screening usually begins at age 50.  Direct examination of the colon should be repeated every 5-10 years through 80 years of age. However, you may need to be screened more often if early forms of precancerous polyps or small growths are found. Skin Cancer  Check your skin from head to toe regularly.  Tell your health care provider about any new moles or changes in moles, especially if there is a change in a mole's shape or color.  Also tell your health care provider if you have a mole that is larger than the size of a pencil eraser.  Always use sunscreen. Apply sunscreen liberally and repeatedly throughout the day.  Protect yourself by wearing long sleeves, pants, a wide-brimmed hat, and sunglasses whenever you are outside. HEART DISEASE, DIABETES, AND HIGH BLOOD PRESSURE   High blood pressure causes heart disease and increases the risk of stroke. High blood pressure is more likely to develop in:  People who have blood pressure in the high end   of the normal range (130-139/85-89 mm Hg).  People who are overweight or obese.  People who are African American.  If you are 38-23 years of age, have your blood pressure checked every 3-5 years. If you are 61 years of age or older, have your blood pressure checked every year. You should have your blood pressure measured twice--once when you are at a hospital or clinic, and once when you are not at a hospital or clinic. Record the average of the two measurements. To check your blood pressure when you are not at a hospital or clinic, you can use:  An automated blood pressure machine at a pharmacy.  A home blood pressure monitor.  If you are between 45 years and 39 years old, ask your health care provider if you should take aspirin to prevent strokes.  Have regular diabetes screenings. This involves taking a blood sample to check your fasting blood sugar level.  If you are at a normal weight and have a low risk for diabetes,  have this test once every three years after 80 years of age.  If you are overweight and have a high risk for diabetes, consider being tested at a younger age or more often. PREVENTING INFECTION  Hepatitis B  If you have a higher risk for hepatitis B, you should be screened for this virus. You are considered at high risk for hepatitis B if:  You were born in a country where hepatitis B is common. Ask your health care provider which countries are considered high risk.  Your parents were born in a high-risk country, and you have not been immunized against hepatitis B (hepatitis B vaccine).  You have HIV or AIDS.  You use needles to inject street drugs.  You live with someone who has hepatitis B.  You have had sex with someone who has hepatitis B.  You get hemodialysis treatment.  You take certain medicines for conditions, including cancer, organ transplantation, and autoimmune conditions. Hepatitis C  Blood testing is recommended for:  Everyone born from [birth year] through 1963.  Anyone with known risk factors for hepatitis C. Sexually transmitted infections (STIs)  You should be screened for sexually transmitted infections (STIs) including gonorrhea and chlamydia if:  You are sexually active and are younger than 80 years of age.  You are older than 80 years of age and your health care provider tells you that you are at risk for this type of infection.  Your sexual activity has changed since you were last screened and you are at an increased risk for chlamydia or gonorrhea. Ask your health care provider if you are at risk.  If you do not have HIV, but are at risk, it may be recommended that you take a prescription medicine daily to prevent HIV infection. This is called pre-exposure prophylaxis (PrEP). You are considered at risk if:  You are sexually active and do not regularly use condoms or know the HIV status of your partner(s).  You take drugs by injection.  You are sexually  active with a partner who has HIV. Talk with your health care provider about whether you are at high risk of being infected with HIV. If you choose to begin PrEP, you should first be tested for HIV. You should then be tested every 3 months for as long as you are taking PrEP.  PREGNANCY   If you are premenopausal and you may become pregnant, ask your health care provider about preconception counseling.  If you may  become pregnant, take 400 to 800 micrograms (mcg) of folic acid every day.  If you want to prevent pregnancy, talk to your health care provider about birth control (contraception). OSTEOPOROSIS AND MENOPAUSE   Osteoporosis is a disease in which the bones lose minerals and strength with aging. This can result in serious bone fractures. Your risk for osteoporosis can be identified using a bone density scan.  If you are 80 years of age or older, or if you are at risk for osteoporosis and fractures, ask your health care provider if you should be screened.  Ask your health care provider whether you should take a calcium or vitamin D supplement to lower your risk for osteoporosis.  Menopause may have certain physical symptoms and risks.  Hormone replacement therapy may reduce some of these symptoms and risks. Talk to your health care provider about whether hormone replacement therapy is right for you.  HOME CARE INSTRUCTIONS   Schedule regular health, dental, and eye exams.  Stay current with your immunizations.   Do not use any tobacco products including cigarettes, chewing tobacco, or electronic cigarettes.  If you are pregnant, do not drink alcohol.  If you are breastfeeding, limit how much and how often you drink alcohol.  Limit alcohol intake to no more than 1 drink per day for nonpregnant women. One drink equals 12 ounces of beer, 5 ounces of wine, or 1 ounces of hard liquor.  Do not use street drugs.  Do not share needles.  Ask your health care provider for help if  you need support or information about quitting drugs.  Tell your health care provider if you often feel depressed.  Tell your health care provider if you have ever been abused or do not feel safe at home.   This information is not intended to replace advice given to you by your health care provider. Make sure you discuss any questions you have with your health care provider.   Document Released: 07/20/2010 Document Revised: 01/25/2014 Document Reviewed: 12/06/2012 Elsevier Interactive Patient Education [employer].

## 2015-02-25 NOTE — Progress Notes (Signed)
Subjective:    Patient ID: Laura Acevedo, female    DOB: 10-10-1932, 82 y.o.   MRN: [id number]  Patient here today for follow up of chronic medical problems.  Outpatient Encounter Prescriptions as of 02/25/2015  Medication Sig  . atorvastatin (LIPITOR) 20 MG tablet Take 1 tablet by mouth  daily  . calcium carbonate (OS-CAL) 600 MG TABS Take 600 mg by mouth daily. AT LUNCH  . cholecalciferol (VITAMIN D) 1000 UNITS tablet Take 2,000 Units by mouth daily.   Marland Kitchen denosumab (PROLIA) 60 MG/ML SOLN injection Inject 60 mg into the skin every 6 (six) months. Administer in upper arm, thigh, or abdomen  . lisinopril-hydrochlorothiazide (PRINZIDE,ZESTORETIC) 10-12.5 MG tablet Take 1 tablet by mouth  daily  . Multiple Vitamins-Minerals (CENTRUM SILVER PO) Take 1 tablet by mouth every morning.  . Multiple Vitamins-Minerals (PRESERVISION AREDS 2 PO) Take by mouth 2 (two) times daily.   No facility-administered encounter medications on file as of 02/25/2015.      Hyperlipidemia This is a chronic problem. The current episode started more than 1 year ago. The problem is controlled. Recent lipid tests were reviewed and are normal. She has no history of diabetes, hypothyroidism or obesity. Pertinent negatives include no chest pain, myalgias or shortness of breath. Current antihyperlipidemic treatment includes statins. The current treatment provides moderate improvement of lipids. Compliance problems include adherence to diet and adherence to exercise.  Risk factors for coronary artery disease include dyslipidemia, hypertension and post-menopausal.  Hypertension This is a chronic problem. The current episode started more than 1 year ago. The problem is unchanged. The problem is controlled. Pertinent negatives include no chest pain, palpitations or shortness of breath. Risk factors for coronary artery disease include dyslipidemia and post-menopausal state. Past treatments include ACE inhibitors and diuretics. The current  treatment provides moderate improvement. Compliance problems include diet and exercise.   osteoporosis Prolia every 6 months- doing well no complaints   Review of Systems  Constitutional: Negative for appetite change and unexpected weight change.  HENT: Negative.   Eyes: Negative.   Respiratory: Negative for shortness of breath.   Cardiovascular: Negative for chest pain and palpitations.  Genitourinary: Negative.   Musculoskeletal: Negative.  Negative for myalgias.  Neurological: Negative.   Psychiatric/Behavioral: Negative.   All other systems reviewed and are negative.      Objective:   Physical Exam  Constitutional: She is oriented to person, place, and time. She appears well-developed and well-nourished.  HENT:  Head: Normocephalic.  Right Ear: Hearing, tympanic membrane, external ear and ear canal normal.  Left Ear: Hearing, tympanic membrane, external ear and ear canal normal.  Nose: Nose normal.  Mouth/Throat: Uvula is midline and oropharynx is clear and moist.  Hard elongated lesion in roof of mouth  Eyes: Conjunctivae and EOM are normal. Pupils are equal, round, and reactive to light.  Neck: Normal range of motion and full passive range of motion without pain. Neck supple. No JVD present. Carotid bruit is not present. No thyroid mass and no thyromegaly present.  Cardiovascular: Normal rate, normal heart sounds and intact distal pulses.   No murmur heard. Varicose veins bil lower ext  Pulmonary/Chest: Effort normal and breath sounds normal. Right breast exhibits no inverted nipple, no mass, no nipple discharge, no skin change and no tenderness. Left breast exhibits no inverted nipple, no mass, no nipple discharge, no skin change and no tenderness.  Abdominal: Soft. Bowel sounds are normal. She exhibits no mass. There is no tenderness.  Genitourinary: No  breast swelling, tenderness, discharge or bleeding.  Musculoskeletal: Normal range of motion.  Lymphadenopathy:    She  has no cervical adenopathy.  Neurological: She is alert and oriented to person, place, and time.  Skin: Skin is warm and dry.  Psychiatric: She has a normal mood and affect. Her behavior is normal. Judgment and thought content normal.    BP 133/72 mmHg  Pulse 73  Temp(Src) 96.8 F (36 C) (Oral)  Ht 5' 6"  (1.676 m)  Wt 133 lb (60.328 kg)  BMI 21.48 kg/m2        Assessment & Plan:   1. Essential hypertension, benign Do not add salt to diet - lisinopril-hydrochlorothiazide (PRINZIDE,ZESTORETIC) 10-12.5 MG tablet; Take 1 tablet by mouth  daily  Dispense: 90 tablet; Refill: 1 - CMP14+EGFR  2. Osteoporosis Weight bearing exercsies  3. Hyperlipidemia Low fat diet - atorvastatin (LIPITOR) 20 MG tablet; Take 1 tablet by mouth  daily  Dispense: 90 tablet; Refill: 1 - Lipid panel  4. Oral lesion -patient is going to see dentist about this- will make oral surgeon referral if needed once sees dentist  Patient to schedule mammogram Labs pending Health maintenance reviewed Diet and exercise encouraged Continue all meds Follow up  In 6 months   [place], FNP

## 2015-02-25 NOTE — Addendum Note (Signed)
Addended by: Rolena Infante on: 02/25/2015 08:49 AM   Modules accepted: Orders

## 2015-02-26 LAB — CMP14+EGFR
ALBUMIN: 4.4 g/dL (ref 3.5–4.7)
ALK PHOS: 55 IU/L (ref 39–117)
ALT: 23 IU/L (ref 0–32)
AST: 24 IU/L (ref 0–40)
Albumin/Globulin Ratio: 1.9 (ref 1.1–2.5)
BILIRUBIN TOTAL: 1.1 mg/dL (ref 0.0–1.2)
BUN / CREAT RATIO: 18 (ref 11–26)
BUN: 17 mg/dL (ref 8–27)
CHLORIDE: 102 mmol/L (ref 96–106)
CO2: 26 mmol/L (ref 18–29)
Calcium: 9.9 mg/dL (ref 8.7–10.3)
Creatinine, Ser: 0.93 mg/dL (ref 0.57–1.00)
GFR calc Af Amer: 67 mL/min/{1.73_m2} (ref >59)
GFR calc non Af Amer: 58 mL/min/{1.73_m2} — ABNORMAL LOW (ref >59)
GLUCOSE: 100 mg/dL — AB (ref 65–99)
Globulin, Total: 2.3 g/dL (ref 1.5–4.5)
Potassium: 5.1 mmol/L (ref 3.5–5.2)
Sodium: 144 mmol/L (ref 134–144)
Total Protein: 6.7 g/dL (ref 6.0–8.5)

## 2015-02-26 LAB — LIPID PANEL
CHOLESTEROL TOTAL: 157 mg/dL (ref 100–199)
Chol/HDL Ratio: 1.7 ratio units (ref 0.0–4.4)
HDL: 90 mg/dL (ref >39)
LDL Calculated: 47 mg/dL (ref 0–99)
TRIGLYCERIDES: 101 mg/dL (ref 0–149)
VLDL CHOLESTEROL CAL: 20 mg/dL (ref 5–40)

## 2015-02-27 ENCOUNTER — Telehealth: Payer: Self-pay | Admitting: Nurse Practitioner

## 2015-02-27 NOTE — Telephone Encounter (Signed)
Reviewed results with patient. 

## 2015-04-18 ENCOUNTER — Encounter: Payer: Medicare Other | Admitting: *Deleted

## 2015-04-18 DIAGNOSIS — Z1231 Encounter for screening mammogram for malignant neoplasm of breast: Secondary | ICD-10-CM | POA: Diagnosis not present

## 2015-04-18 LAB — HM MAMMOGRAPHY

## 2015-04-29 ENCOUNTER — Encounter: Payer: Self-pay | Admitting: *Deleted

## 2015-05-08 DIAGNOSIS — H353131 Nonexudative age-related macular degeneration, bilateral, early dry stage: Secondary | ICD-10-CM | POA: Diagnosis not present

## 2015-05-08 DIAGNOSIS — H35371 Puckering of macula, right eye: Secondary | ICD-10-CM | POA: Diagnosis not present

## 2015-05-22 DIAGNOSIS — M79674 Pain in right toe(s): Secondary | ICD-10-CM | POA: Diagnosis not present

## 2015-05-22 DIAGNOSIS — M2042 Other hammer toe(s) (acquired), left foot: Secondary | ICD-10-CM | POA: Diagnosis not present

## 2015-08-28 ENCOUNTER — Ambulatory Visit (INDEPENDENT_AMBULATORY_CARE_PROVIDER_SITE_OTHER): Payer: Medicare Other | Admitting: Pharmacist

## 2015-08-28 ENCOUNTER — Encounter: Payer: Self-pay | Admitting: Pharmacist

## 2015-08-28 VITALS — BP 136/62 | HR 78 | Ht 66.0 in | Wt 132.0 lb

## 2015-08-28 DIAGNOSIS — M810 Age-related osteoporosis without current pathological fracture: Secondary | ICD-10-CM | POA: Diagnosis not present

## 2015-08-28 DIAGNOSIS — Z0000 Encounter for general adult medical examination without abnormal findings: Secondary | ICD-10-CM

## 2015-08-28 MED ORDER — DENOSUMAB 60 MG/ML ~~LOC~~ SOLN
60.0000 mg | Freq: Once | SUBCUTANEOUS | Status: AC
  Administered 2015-08-28: 60 mg via SUBCUTANEOUS

## 2015-08-28 NOTE — Progress Notes (Signed)
Patient ID: [name], female   DOB: 1932-12-17, 82 y.o.   MRN: [id number]    Subjective:   Laura Acevedo is a 82 y.o. female who presents for a subsequent Medicare Annual Wellness Visit and to follow up osteoporosis.  Patient is due to have Prolia adminstered today.    Laura Acevedo is a WF who lives in [place] with her husband.  She a retired Engineer, manufacturing systems (sewing).  She reports that she feels that she is in fairly good health but is concerned that her hair is thinning.  Last thyroid panel was 2015.    Review of Systems  Constitutional: Negative.   HENT: Negative.   Eyes: Negative.   Respiratory: Negative.   Cardiovascular: Negative.   Gastrointestinal: Negative.   Genitourinary: Negative.   Musculoskeletal: Negative.   Skin: Negative.   Neurological: Negative.   Endo/Heme/Allergies: Negative.   Psychiatric/Behavioral: Negative.     Current Medications (verified) Outpatient Encounter Prescriptions as of 08/28/2015  Medication Sig  . atorvastatin (LIPITOR) 20 MG tablet Take 1 tablet by mouth  daily  . calcium carbonate (OS-CAL) 600 MG TABS Take 600 mg by mouth daily. AT LUNCH  . cholecalciferol (VITAMIN D) 1000 UNITS tablet Take 2,000 Units by mouth daily.   Marland Kitchen denosumab (PROLIA) 60 MG/ML SOLN injection Inject 60 mg into the skin every 6 (six) months. Administer in upper arm, thigh, or abdomen  . lisinopril-hydrochlorothiazide (PRINZIDE,ZESTORETIC) 10-12.5 MG tablet Take 1 tablet by mouth  daily  . Multiple Vitamins-Minerals (CENTRUM SILVER PO) Take 1 tablet by mouth every morning.  . Multiple Vitamins-Minerals (PRESERVISION AREDS 2 PO) Take by mouth 2 (two) times daily.  . [EXPIRED] denosumab (PROLIA) injection 60 mg    No facility-administered encounter medications on file as of 08/28/2015.     Allergies (verified) Review of patient's allergies indicates no known allergies.   History: Past Medical History:  Diagnosis Date  . Cancer ([place]) 1980   melanoma  . Cataract   .  Colon polyps   . 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 Hyperlipidemia   . Hypertension   . Osteoporosis    Past Surgical History:  Procedure Laterality Date  . CATARACT EXTRACTION    . FOOT NEUROMA SURGERY Left 2005   Dr Irving Shows  . MELANOMA EXCISION Left    leg   Family History  Problem Relation Age of Onset  . Diabetes Mother   . Heart disease Mother   . Heart disease Father   . Heart attack Father   . Hypertension Sister   . Diabetes Brother   . Hypertension Brother   . Cancer Brother     bladder   . Diabetes Brother   . Diabetes Brother   . Colon cancer Neg Hx    Social History   Occupational History  . Not on file.   Social History Main Topics  . Smoking status: Never Smoker  . Smokeless tobacco: Never Used  . Alcohol use No  . Drug use: No  . Sexual activity: Yes    Do you feel safe at home?  Yes  Dietary issues and exercise activities: Current Exercise Habits: The patient does not participate in regular exercise at present, Exercise limited by: None identified - patient is not currently exercising daily but she is very active.  She does all own housework, gardens and parks away from stores so she can walk further.   Current Dietary habits:  Patient's diet consists of lots of vegetables.  She  eat little meat.  She limits portion sizes to maintain a healthy weight.   Objective:    Today's Vitals   08/28/15 1034  BP: 136/62  Pulse: 78  Weight: 132 lb (59.9 kg)  Height: 5\' 6"  (1.676 m)  PainSc: 0-No pain   Body mass index is 21.31 kg/m.  Activities of daily living assessed - no deficits detected  Are there smokers in your home (other than you)? No   Cardiac Risk Factors include: advanced age (>83men, >4 women);dyslipidemia;hypertension  Depression Screen PHQ 2/9 Scores 08/28/2015 02/25/2015 11/15/2014 08/23/2014  PHQ - 2 Score 0 0 0 0    Fall Risk Fall Risk  08/28/2015 02/25/2015 11/15/2014 08/23/2014 07/31/2014  Falls in the past year? No No No No No     Cognitive Function: MMSE - Mini Mental State Exam 08/28/2015 08/28/2015 08/23/2014  Orientation to time 5 5 5   Orientation to Place 5 5 5   Registration 3 3 3   Attention/ Calculation 5 5 5   Recall 2 3 3   Language- name 2 objects 2 2 2   Language- repeat 1 1 1   Language- follow 3 step command 3 3 3   Language- read & follow direction 1 1 1   Write a sentence 1 1 1   Copy design 1 1 1   Total score 29 30 30     Immunizations and Health Maintenance Immunization History  Administered Date(s) Administered  . Influenza,inj,Quad PF,36+ Mos 11/22/2012, 11/05/2013, 10/30/2014  . Pneumococcal Conjugate-13 11/05/2013  . Pneumococcal Polysaccharide-23 11/19/2014   Health Maintenance Due  Topic Date Due  . ZOSTAVAX  06/06/1994  . TETANUS/TDAP  11/18/2012  . INFLUENZA VACCINE  08/19/2015    Patient Care Team: Chevis Pretty, FNP as PCP - General (Nurse Practitioner) Calvert Cantor, MD as Consulting Physician (Ophthalmology) Druscilla Brownie, MD as Consulting Physician (Dermatology) Inda Castle, MD as Consulting Physician (Gastroenterology)  Indicate any recent Medical Services you may have received from other than [facility] providers in the past year (date may be approximate).    Assessment:    Annual Wellness Visit  Osteoporosis Hair loss - reviewed medications, identified less than 2% incidence of alopecia for atorvastatin.  Also last TSH was 2 years ago  Screening Tests Health Maintenance  Topic Date Due  . ZOSTAVAX  06/06/1994  . TETANUS/TDAP  11/18/2012  . INFLUENZA VACCINE  08/19/2015  . MAMMOGRAM  04/17/2016  . DEXA SCAN  09/24/2016  . PNA vac Low Risk Adult  Completed        Plan:   During the course of the visit Laura Acevedo was educated and counseled about the following appropriate screening and preventive services:   Vaccines to include Pneumoccal, Influenza, Hepatitis B, Td, Zostavax - patient declined Zostavax and tDAP due to cost  Colorectal cancer screening -  colonoscopy UTD; Declines FOBT  Cardiovascular disease screening - BP at goal;    Last EKG was 03/20/2013  Alopecia - not likely atorvastatin but cannot rule out.  Patient is having labs drawn next week at appt with PCP - will recommend checking thyroid panel.  If WNL then consider holding atorvastatin for 1-2 months to see if alopecia improves.  Diabetes screening - UTD  Bone Denisty / Osteoporosis Screening - next DEXA due 09/2016  Mammogram - UTD  PAP - no longer required  Glaucoma screening /  Eye Exam - UTD  Nutrition counseling - patient follows a healthy diet already and maintains a healthy weight   Advanced Directives - UTD  Exercise - discussed continued active  lifestyle.   Prolia 60mg  administered SQ in left arm - next due 02/2016  Continue calcium intake with supplement and diet - patient is getting at least 1200mg  daily  Fall prevention reviewed and discussed   Patient Instructions (the written plan) were given to the patient.   Cherre Robins, PharmD , CPP, CDE  08/28/2015

## 2015-08-28 NOTE — Patient Instructions (Addendum)
Laura Acevedo , Thank you for taking time to come for your Medicare Wellness Visit. I appreciate your ongoing commitment to your health goals. Please review the following plan we discussed and let me know if I can assist you in the future.   These are the goals we discussed: We will check thyroid when you have labs drawn next week.  Continue to stay active - goal is to get about 150 minutes of physical activity each week.   Continue to eat lots of non-starchy vegetables - carrots, green bean, squash, zucchini, tomatoes, onions, peppers, spinach and other green leafy vegetables, cabbage, lettuce, cucumbers, asparagus, okra (not fried), eggplant Limit sugar and processed foods (cakes, cookies, ice cream, crackers and chips) Increase fresh fruit but limit serving sizes 1/2 cup or about the size of tennis or baseball Limit red meat to no more than 1-2 times per week (serving size about the size of your palm) Choose whole grains / lean proteins - whole wheat bread, quinoa, whole grain rice (1/2 cup), fish, chicken, [place] Avoid sugar and calorie containing beverages - soda, sweet tea and juice.  Choose water or unsweetened tea instead.    This is a list of the screening recommended for you and due dates:  Health Maintenance  Topic Date Due  . Shingles Vaccine  06/06/1994  . Tetanus Vaccine  11/18/2012  . Pap Smear  09/13/2014  . Flu Shot  08/19/2015  . Mammogram  04/17/2016  . DEXA scan (bone density measurement)  09/24/2016  . Pneumonia vaccines  Completed   Fall Prevention in the Home  Falls can cause injuries and can affect people from all age groups. There are many simple things that you can do to make your home safe and to help prevent falls. WHAT CAN I DO ON THE OUTSIDE OF MY HOME?  Regularly repair the edges of walkways and driveways and fix any cracks.  Remove high doorway thresholds.  Trim any shrubbery on the main path into your home.  Use bright outdoor lighting.  Clear  walkways of debris and clutter, including tools and rocks.  Regularly check that handrails are securely fastened and in good repair. Both sides of any steps should have handrails.  Install guardrails along the edges of any raised decks or porches.  Have leaves, snow, and ice cleared regularly.  Use sand or salt on walkways during winter months.  In the garage, clean up any spills right away, including grease or oil spills. WHAT CAN I DO IN THE BATHROOM?  Use night lights.  Install grab bars by the toilet and in the tub and shower. Do not use towel bars as grab bars.  Use non-skid mats or decals on the floor of the tub or shower.  If you need to sit down while you are in the shower, use a plastic, non-slip stool.Marland Kitchen  Keep the floor dry. Immediately clean up any water that spills on the floor.  Remove soap buildup in the tub or shower on a regular basis.  Attach bath mats securely with double-sided non-slip rug tape.  Remove throw rugs and other tripping hazards from the floor. WHAT CAN I DO IN THE BEDROOM?  Use night lights.  Make sure that a bedside light is easy to reach.  Do not use oversized bedding that drapes onto the floor.  Have a firm chair that has side arms to use for getting dressed.  Remove throw rugs and other tripping hazards from the floor. WHAT CAN I DO  IN THE KITCHEN?   Clean up any spills right away.  Avoid walking on wet floors.  Place frequently used items in easy-to-reach places.  If you need to reach for something above you, use a sturdy step stool that has a grab bar.  Keep electrical cables out of the way.  Do not use floor polish or wax that makes floors slippery. If you have to use wax, make sure that it is non-skid floor wax.  Remove throw rugs and other tripping hazards from the floor. WHAT CAN I DO IN THE STAIRWAYS?  Do not leave any items on the stairs.  Make sure that there are handrails on both sides of the stairs. Fix handrails  that are broken or loose. Make sure that handrails are as long as the stairways.  Check any carpeting to make sure that it is firmly attached to the stairs. Fix any carpet that is loose or worn.  Avoid having throw rugs at the top or bottom of stairways, or secure the rugs with carpet tape to prevent them from moving.  Make sure that you have a light switch at the top of the stairs and the bottom of the stairs. If you do not have them, have them installed. WHAT ARE SOME OTHER FALL PREVENTION TIPS?  Wear closed-toe shoes that fit well and support your feet. Wear shoes that have rubber soles or low heels.  When you use a stepladder, make sure that it is completely opened and that the sides are firmly locked. Have someone hold the ladder while you are using it. Do not climb a closed stepladder.  Add color or contrast paint or tape to grab bars and handrails in your home. Place contrasting color strips on the first and last steps.  Use mobility aids as needed, such as canes, walkers, scooters, and crutches.  Turn on lights if it is dark. Replace any light bulbs that burn out.  Set up furniture so that there are clear paths. Keep the furniture in the same spot.  Fix any uneven floor surfaces.  Choose a carpet design that does not hide the edge of steps of a stairway.  Be aware of any and all pets.  Review your medicines with your healthcare provider. Some medicines can cause dizziness or changes in blood pressure, which increase your risk of falling. Talk with your health care provider about other ways that you can decrease your risk of falls. This may include working with a physical therapist or trainer to improve your strength, balance, and endurance.   This information is not intended to replace advice given to you by your health care provider. Make sure you discuss any questions you have with your health care provider.   Document Released: 12/25/2001 Document Revised: 05/21/2014  Document Reviewed: 02/08/2014 Elsevier Interactive Patient Education [employer].

## 2015-09-01 DIAGNOSIS — C44112 Basal cell carcinoma of skin of right eyelid, including canthus: Secondary | ICD-10-CM | POA: Diagnosis not present

## 2015-09-01 DIAGNOSIS — L821 Other seborrheic keratosis: Secondary | ICD-10-CM | POA: Diagnosis not present

## 2015-09-01 DIAGNOSIS — D225 Melanocytic nevi of trunk: Secondary | ICD-10-CM | POA: Diagnosis not present

## 2015-09-01 DIAGNOSIS — D1801 Hemangioma of skin and subcutaneous tissue: Secondary | ICD-10-CM | POA: Diagnosis not present

## 2015-09-09 ENCOUNTER — Ambulatory Visit (INDEPENDENT_AMBULATORY_CARE_PROVIDER_SITE_OTHER): Payer: Medicare Other | Admitting: Nurse Practitioner

## 2015-09-09 ENCOUNTER — Encounter: Payer: Self-pay | Admitting: Nurse Practitioner

## 2015-09-09 VITALS — BP 132/67 | HR 64 | Temp 96.8°F | Ht 66.0 in | Wt 132.0 lb

## 2015-09-09 DIAGNOSIS — I10 Essential (primary) hypertension: Secondary | ICD-10-CM | POA: Diagnosis not present

## 2015-09-09 DIAGNOSIS — E785 Hyperlipidemia, unspecified: Secondary | ICD-10-CM

## 2015-09-09 DIAGNOSIS — M810 Age-related osteoporosis without current pathological fracture: Secondary | ICD-10-CM | POA: Diagnosis not present

## 2015-09-09 MED ORDER — ATORVASTATIN CALCIUM 20 MG PO TABS: ORAL_TABLET | ORAL | 1 refills | Status: DC

## 2015-09-09 MED ORDER — LISINOPRIL-HYDROCHLOROTHIAZIDE 10-12.5 MG PO TABS: ORAL_TABLET | ORAL | 1 refills | Status: DC

## 2015-09-09 NOTE — Patient Instructions (Signed)
Osteoporosis  Osteoporosis is the thinning and loss of density in the bones. Osteoporosis makes the bones more brittle, fragile, and likely to break (fracture). Over time, osteoporosis can cause the bones to become so weak that they fracture after a simple fall. The bones most likely to fracture are the bones in the hip, wrist, and spine.  CAUSES   The exact cause is not known.  RISK FACTORS  Anyone can develop osteoporosis. You may be at greater risk if you have a family history of the condition or have poor nutrition. You may also have a higher risk if you are:   · Female.    · 50 years old or older.  · A smoker.  · Not physically active.    · White or Asian.  · Slender.  SIGNS AND SYMPTOMS   A fracture might be the first sign of the disease, especially if it results from a fall or injury that would not usually cause a bone to break. Other signs and symptoms include:   · Low back and neck pain.  · Stooped posture.  · Height loss.  DIAGNOSIS   To make a diagnosis, your health care provider may:  · Take a medical history.  · Perform a physical exam.  · Order tests, such as:    A bone mineral density test.    A dual-energy X-ray absorptiometry test.  TREATMENT   The goal of osteoporosis treatment is to strengthen your bones to reduce your risk of a fracture. Treatment may involve:  · Making lifestyle changes, such as:    Eating a diet rich in calcium.    Doing weight-bearing and muscle-strengthening exercises.    Stopping tobacco use.    Limiting alcohol intake.  · Taking medicine to slow the process of bone loss or to increase bone density.  · Monitoring your levels of calcium and vitamin D.  HOME CARE INSTRUCTIONS  · Include calcium and vitamin D in your diet. Calcium is important for bone health, and vitamin D helps the body absorb calcium.  · Perform weight-bearing and muscle-strengthening exercises as directed by your health care provider.  · Do not use any tobacco products, including cigarettes, chewing  tobacco, and electronic cigarettes. If you need help quitting, ask your health care provider.  · Limit your alcohol intake.  · Take medicines only as directed by your health care provider.  · Keep all follow-up visits as directed by your health care provider. This is important.  · Take precautions at home to lower your risk of falling, such as:    Keeping rooms well lit and clutter free.    Installing safety rails on stairs.    Using rubber mats in the bathroom and other areas that are often wet or slippery.  SEEK IMMEDIATE MEDICAL CARE IF:   You fall or injure yourself.      This information is not intended to replace advice given to you by your health care provider. Make sure you discuss any questions you have with your health care provider.     Document Released: 10/14/2004 Document Revised: 01/25/2014 Document Reviewed: 06/14/2013  Elsevier Interactive Patient Education ©2016 [employer]

## 2015-09-09 NOTE — Progress Notes (Signed)
Subjective:    Patient ID: Laura Acevedo, female    DOB: June 24, 1932, 82 y.o.   MRN: [id number]  Patient here today for follow up of chronic medical problems. Patient had a basal cell CA taken off her right brow last Monday 09/01/15. Patient feels fine today and has no new complaint.   Outpatient Encounter Prescriptions as of 09/09/2015  Medication Sig  . atorvastatin (LIPITOR) 20 MG tablet Take 1 tablet by mouth  daily  . calcium carbonate (OS-CAL) 600 MG TABS Take 600 mg by mouth daily. AT LUNCH  . cholecalciferol (VITAMIN D) 1000 UNITS tablet Take 2,000 Units by mouth daily.   Marland Kitchen denosumab (PROLIA) 60 MG/ML SOLN injection Inject 60 mg into the skin every 6 (six) months. Administer in upper arm, thigh, or abdomen  . lisinopril-hydrochlorothiazide (PRINZIDE,ZESTORETIC) 10-12.5 MG tablet Take 1 tablet by mouth  daily  . Multiple Vitamins-Minerals (CENTRUM SILVER PO) Take 1 tablet by mouth every morning.  . Multiple Vitamins-Minerals (PRESERVISION AREDS 2 PO) Take by mouth 2 (two) times daily.   No facility-administered encounter medications on file as of 09/09/2015.       Hypertension  This is a chronic problem. The current episode started more than 1 year ago. The problem is unchanged. The problem is controlled. Pertinent negatives include no chest pain, palpitations or shortness of breath. Risk factors for coronary artery disease include dyslipidemia and post-menopausal state. Past treatments include ACE inhibitors and diuretics. The current treatment provides moderate improvement. Compliance problems include diet and exercise.   Hyperlipidemia  This is a chronic problem. The current episode started more than 1 year ago. The problem is controlled. Recent lipid tests were reviewed and are normal. She has no history of diabetes, hypothyroidism or obesity. Pertinent negatives include no chest pain, myalgias or shortness of breath. Current antihyperlipidemic treatment includes statins. The current  treatment provides moderate improvement of lipids. Compliance problems include adherence to diet and adherence to exercise.  Risk factors for coronary artery disease include dyslipidemia, hypertension and post-menopausal.  osteoporosis Prolia every 6 months- doing well no complaints   Review of Systems  Constitutional: Negative for appetite change and unexpected weight change.  HENT: Negative.   Eyes: Negative.   Respiratory: Negative for shortness of breath.   Cardiovascular: Negative for chest pain and palpitations.  Genitourinary: Negative.   Musculoskeletal: Negative.  Negative for myalgias.  Skin: Positive for wound (cancer lesion removed form right eye brow.).  Neurological: Negative.   Psychiatric/Behavioral: Negative.   All other systems reviewed and are negative.      Objective:   Physical Exam  Constitutional: She is oriented to person, place, and time. She appears well-developed and well-nourished.  HENT:  Head: Normocephalic.  Right Ear: Hearing, tympanic membrane, external ear and ear canal normal.  Left Ear: Hearing, tympanic membrane, external ear and ear canal normal.  Nose: Nose normal.  Mouth/Throat: Uvula is midline and oropharynx is clear and moist.  Hard elongated lesion in roof of mouth  Eyes: Conjunctivae and EOM are normal. Pupils are equal, round, and reactive to light.  Neck: Normal range of motion and full passive range of motion without pain. Neck supple. No JVD present. Carotid bruit is not present. No thyroid mass and no thyromegaly present.  Cardiovascular: Normal rate, normal heart sounds and intact distal pulses.   No murmur heard. Varicose veins bil lower ext  Pulmonary/Chest: Effort normal and breath sounds normal. Right breast exhibits no inverted nipple, no mass, no nipple discharge, no skin  change and no tenderness. Left breast exhibits no inverted nipple, no mass, no nipple discharge, no skin change and no tenderness.  Abdominal: Soft. Bowel  sounds are normal. She exhibits no mass. There is no tenderness.  Genitourinary: No breast swelling, tenderness, discharge or bleeding.  Musculoskeletal: Normal range of motion.  Lymphadenopathy:    She has no cervical adenopathy.  Neurological: She is alert and oriented to person, place, and time.  Skin: Skin is warm and dry.  scabbed over healing wound to right eye brow- no signs of infection.  Psychiatric: She has a normal mood and affect. Her behavior is normal. Judgment and thought content normal.    BP 132/67 (BP Location: Left Arm, Patient Position: Sitting, Cuff Size: Normal)   Pulse 64   Temp (!) 96.8 F (36 C) (Oral)   Ht _0  (1.676 m)   Wt 132 lb (59.9 kg)   BMI 21.31 kg/m         Assessment & Plan:   1. Essential hypertension, benign Do not add salt to diet - lisinopril-hydrochlorothiazide (PRINZIDE,ZESTORETIC) 10-12.5 MG tablet; Take 1 tablet by mouth  daily  Dispense: 90 tablet; Refill: 1 - CMP14+EGFR  2. Osteoporosis Continue prolia Weight bearing exercises encouraged  3. Hyperlipidemia Low fta idet - atorvastatin (LIPITOR) 20 MG tablet; Take 1 tablet by mouth  daily  Dispense: 90 tablet; Refill: 1 - Lipid panel    Labs pending Health maintenance reviewed Diet and exercise encouraged Continue all meds Follow up  In 3 months   [place], FNP

## 2015-09-10 LAB — LIPID PANEL
CHOL/HDL RATIO: 1.6 ratio (ref 0.0–4.4)
CHOLESTEROL TOTAL: 146 mg/dL (ref 100–199)
HDL: 90 mg/dL (ref >39)
LDL Calculated: 43 mg/dL (ref 0–99)
TRIGLYCERIDES: 65 mg/dL (ref 0–149)
VLDL Cholesterol Cal: 13 mg/dL (ref 5–40)

## 2015-09-10 LAB — CMP14+EGFR
A/G RATIO: 1.9 (ref 1.2–2.2)
ALK PHOS: 44 IU/L (ref 39–117)
ALT: 23 IU/L (ref 0–32)
AST: 27 IU/L (ref 0–40)
Albumin: 4.3 g/dL (ref 3.5–4.7)
BUN/Creatinine Ratio: 21 (ref 12–28)
BUN: 22 mg/dL (ref 8–27)
Bilirubin Total: 1.1 mg/dL (ref 0.0–1.2)
CO2: 27 mmol/L (ref 18–29)
Calcium: 9.6 mg/dL (ref 8.7–10.3)
Chloride: 98 mmol/L (ref 96–106)
Creatinine, Ser: 1.05 mg/dL — ABNORMAL HIGH (ref 0.57–1.00)
GFR calc Af Amer: 58 mL/min/{1.73_m2} — ABNORMAL LOW (ref >59)
GFR, EST NON AFRICAN AMERICAN: 50 mL/min/{1.73_m2} — AB (ref >59)
GLOBULIN, TOTAL: 2.3 g/dL (ref 1.5–4.5)
Glucose: 90 mg/dL (ref 65–99)
POTASSIUM: 4.2 mmol/L (ref 3.5–5.2)
SODIUM: 140 mmol/L (ref 134–144)
Total Protein: 6.6 g/dL (ref 6.0–8.5)

## 2015-11-04 ENCOUNTER — Ambulatory Visit (INDEPENDENT_AMBULATORY_CARE_PROVIDER_SITE_OTHER): Payer: Medicare Other

## 2015-11-04 DIAGNOSIS — Z23 Encounter for immunization: Secondary | ICD-10-CM | POA: Diagnosis not present

## 2015-11-13 DIAGNOSIS — H353131 Nonexudative age-related macular degeneration, bilateral, early dry stage: Secondary | ICD-10-CM | POA: Diagnosis not present

## 2015-11-13 DIAGNOSIS — Z85828 Personal history of other malignant neoplasm of skin: Secondary | ICD-10-CM | POA: Diagnosis not present

## 2015-11-13 DIAGNOSIS — L820 Inflamed seborrheic keratosis: Secondary | ICD-10-CM | POA: Diagnosis not present

## 2015-12-12 ENCOUNTER — Encounter: Payer: Self-pay | Admitting: Nurse Practitioner

## 2015-12-12 ENCOUNTER — Ambulatory Visit (INDEPENDENT_AMBULATORY_CARE_PROVIDER_SITE_OTHER): Payer: Medicare Other | Admitting: Nurse Practitioner

## 2015-12-12 VITALS — BP 121/64 | HR 61 | Temp 96.9°F | Ht 66.0 in | Wt 129.0 lb

## 2015-12-12 DIAGNOSIS — I10 Essential (primary) hypertension: Secondary | ICD-10-CM

## 2015-12-12 DIAGNOSIS — E782 Mixed hyperlipidemia: Secondary | ICD-10-CM

## 2015-12-12 DIAGNOSIS — M810 Age-related osteoporosis without current pathological fracture: Secondary | ICD-10-CM

## 2015-12-12 NOTE — Progress Notes (Signed)
Subjective:    Patient ID: Laura Acevedo, female    DOB: June 21, 1932, 82 y.o.   MRN: [id number]  Patient here today for follow up of chronic medical problems. No changes since last visit. No complaints today.  Outpatient Encounter Prescriptions as of 12/12/2015  Medication Sig  . atorvastatin (LIPITOR) 20 MG tablet Take 1 tablet by mouth  daily  . calcium carbonate (OS-CAL) 600 MG TABS Take 600 mg by mouth daily. AT LUNCH  . cholecalciferol (VITAMIN D) 1000 UNITS tablet Take 2,000 Units by mouth daily.   Marland Kitchen denosumab (PROLIA) 60 MG/ML SOLN injection Inject 60 mg into the skin every 6 (six) months. Administer in upper arm, thigh, or abdomen  . lisinopril-hydrochlorothiazide (PRINZIDE,ZESTORETIC) 10-12.5 MG tablet Take 1 tablet by mouth  daily  . Multiple Vitamins-Minerals (CENTRUM SILVER PO) Take 1 tablet by mouth every morning.  . Multiple Vitamins-Minerals (PRESERVISION AREDS 2 PO) Take by mouth 2 (two) times daily.   No facility-administered encounter medications on file as of 12/12/2015.       Hypertension  This is a chronic problem. The current episode started more than 1 year ago. The problem is unchanged. The problem is controlled. Pertinent negatives include no chest pain, palpitations or shortness of breath. Risk factors for coronary artery disease include dyslipidemia and post-menopausal state. Past treatments include ACE inhibitors and diuretics. The current treatment provides moderate improvement. Compliance problems include diet and exercise.   Hyperlipidemia  This is a chronic problem. The current episode started more than 1 year ago. The problem is controlled. Recent lipid tests were reviewed and are normal. She has no history of diabetes, hypothyroidism or obesity. Pertinent negatives include no chest pain, myalgias or shortness of breath. Current antihyperlipidemic treatment includes statins. The current treatment provides moderate improvement of lipids. Compliance problems include  adherence to diet and adherence to exercise.  Risk factors for coronary artery disease include dyslipidemia, hypertension and post-menopausal.  osteoporosis Prolia every 6 months- doing well no complaints   Review of Systems  Constitutional: Negative for appetite change and unexpected weight change.  HENT: Negative.   Eyes: Negative.   Respiratory: Negative for shortness of breath.   Cardiovascular: Negative for chest pain and palpitations.  Genitourinary: Negative.   Musculoskeletal: Negative.  Negative for myalgias.  Neurological: Negative.   Psychiatric/Behavioral: Negative.   All other systems reviewed and are negative.      Objective:   Physical Exam  Constitutional: She is oriented to person, place, and time. She appears well-developed and well-nourished.  HENT:  Head: Normocephalic.  Right Ear: Hearing, tympanic membrane, external ear and ear canal normal.  Left Ear: Hearing, tympanic membrane, external ear and ear canal normal.  Nose: Nose normal.  Mouth/Throat: Uvula is midline and oropharynx is clear and moist.  Eyes: Conjunctivae and EOM are normal. Pupils are equal, round, and reactive to light.  Neck: Normal range of motion and full passive range of motion without pain. Neck supple. No JVD present. Carotid bruit is not present. No thyroid mass and no thyromegaly present.  Cardiovascular: Normal rate, normal heart sounds and intact distal pulses.   No murmur heard. Varicose veins bil lower ext  Pulmonary/Chest: Effort normal and breath sounds normal. Right breast exhibits no inverted nipple, no mass, no nipple discharge, no skin change and no tenderness. Left breast exhibits no inverted nipple, no mass, no nipple discharge, no skin change and no tenderness.  Abdominal: Soft. Bowel sounds are normal. She exhibits no mass. There is no tenderness.  Genitourinary: No breast swelling, tenderness, discharge or bleeding.  Musculoskeletal: Normal range of motion.   Lymphadenopathy:    She has no cervical adenopathy.  Neurological: She is alert and oriented to person, place, and time.  Skin: Skin is warm and dry.  Psychiatric: She has a normal mood and affect. Her behavior is normal. Judgment and thought content normal.    BP 121/64   Pulse 61   Temp (!) 96.9 F (36.1 C) (Oral)   Ht _0  (1.676 m)   Wt 129 lb (58.5 kg)   BMI 20.82 kg/m        Assessment & Plan:   1. Essential hypertension, benign Low sodium diet - CMP14+EGFR  2. Mixed hyperlipidemia Low fat diet - Lipid panel  3. Age-related osteoporosis without current pathological fracture Weight bearing exercises    Labs pending Health maintenance reviewed Diet and exercise encouraged Continue all meds Follow up  In 3 months   [place], FNP

## 2015-12-12 NOTE — Patient Instructions (Signed)
Bone Health Introduction Bones protect organs, store calcium, and anchor muscles. Good health habits, such as eating nutritious foods and exercising regularly, are important for maintaining healthy bones. They can also help to prevent a condition that causes bones to lose density and become weak and brittle (osteoporosis). Why is bone mass important? Bone mass refers to the amount of bone tissue that you have. The higher your bone mass, the stronger your bones. An important step toward having healthy bones throughout life is to have strong and dense bones during childhood. A young adult who has a high bone mass is more likely to have a high bone mass later in life. Bone mass at its greatest it is called peak bone mass. A large decline in bone mass occurs in older adults. In women, it occurs about the time of menopause. During this time, it is important to practice good health habits, because if more bone is lost than what is replaced, the bones will become less healthy and more likely to break (fracture). If you find that you have a low bone mass, you may be able to prevent osteoporosis or further bone loss by changing your diet and lifestyle. How can I find out if my bone mass is low? Bone mass can be measured with an X-ray test that is called a bone mineral density (BMD) test. This test is recommended for all women who are age 65 or older. It may also be recommended for men who are age 70 or older, or for people who are more likely to develop osteoporosis due to:  Having bones that break easily.  Having a long-term disease that weakens bones, such as kidney disease or rheumatoid arthritis.  Having menopause earlier than normal.  Taking medicine that weakens bones, such as steroids, thyroid hormones, or hormone treatment for breast cancer or prostate cancer.  Smoking.  Drinking three or more alcoholic drinks each day. What are the nutritional recommendations for healthy bones? To have healthy  bones, you need to get enough of the right minerals and vitamins. Most nutrition experts recommend getting these nutrients from the foods that you eat. Nutritional recommendations vary from person to person. Ask your health care provider what is healthy for you. Here are some general guidelines. Calcium Recommendations  Calcium is the most important (essential) mineral for bone health. Most people can get enough calcium from their diet, but supplements may be recommended for people who are at risk for osteoporosis. Good sources of calcium include:  Dairy products, such as low-fat or nonfat milk, cheese, and yogurt.  Dark green leafy vegetables, such as bok choy and broccoli.  Calcium-fortified foods, such as orange juice, cereal, bread, soy beverages, and tofu products.  Nuts, such as almonds. Follow these recommended amounts for daily calcium intake:  Children, age 1?3: 700 mg.  Children, age 4?8: 1,000 mg.  Children, age 9?13: 1,300 mg.  Teens, age 14?18: 1,300 mg.  Adults, age 19?50: 1,000 mg.  Adults, age 51?70:  Men: 1,000 mg.  Women: 1,200 mg.  Adults, age 71 or older: 1,200 mg.  Pregnant and breastfeeding females:  Teens: 1,300 mg.  Adults: 1,000 mg. Vitamin D Recommendations  Vitamin D is the most essential vitamin for bone health. It helps the body to absorb calcium. Sunlight stimulates the skin to make vitamin D, so be sure to get enough sunlight. If you live in a cold climate or you do not get outside often, your health care provider may recommend that you take vitamin   D supplements. Good sources of vitamin D in your diet include:  Egg yolks.  Saltwater fish.  Milk and cereal fortified with vitamin D. Follow these recommended amounts for daily vitamin D intake:  Children and teens, age 1?18: 600 international units.  Adults, age 50 or younger: 400-800 international units.  Adults, age 51 or older: 800-1,000 international units. Other Nutrients  Other  nutrients for bone health include:  Phosphorus. This mineral is found in meat, poultry, dairy foods, nuts, and legumes. The recommended daily intake for adult men and adult women is 700 mg.  Magnesium. This mineral is found in seeds, nuts, dark green vegetables, and legumes. The recommended daily intake for adult men is 400?420 mg. For adult women, it is 310?320 mg.  Vitamin K. This vitamin is found in green leafy vegetables. The recommended daily intake is 120 mg for adult men and 90 mg for adult women. What type of physical activity is best for building and maintaining healthy bones? Weight-bearing and strength-building activities are important for building and maintaining peak bone mass. Weight-bearing activities cause muscles and bones to work against gravity. Strength-building activities increases muscle strength that supports bones. Weight-bearing and muscle-building activities include:  Walking and hiking.  Jogging and running.  Dancing.  Gym exercises.  Lifting weights.  Tennis and racquetball.  Climbing stairs.  Aerobics. Adults should get at least 30 minutes of moderate physical activity on most days. Children should get at least 60 minutes of moderate physical activity on most days. Ask your health care provide what type of exercise is best for you. Where can I find more information? For more information, check out the following websites:  National Osteoporosis Foundation: http://nof.org/learn/basics  National Institutes of Health: http://www.niams.nih.gov/Health_Info/Bone/Bone_Health/bone_health_for_life.asp This information is not intended to replace advice given to you by your health care provider. Make sure you discuss any questions you have with your health care provider. Document Released: 03/27/2003 Document Revised: 07/25/2015 Document Reviewed: 01/09/2014  2017 Elsevier  

## 2015-12-13 LAB — CMP14+EGFR
A/G RATIO: 1.9 (ref 1.2–2.2)
ALBUMIN: 4.2 g/dL (ref 3.5–4.7)
ALK PHOS: 58 IU/L (ref 39–117)
ALT: 24 IU/L (ref 0–32)
AST: 28 IU/L (ref 0–40)
BILIRUBIN TOTAL: 0.9 mg/dL (ref 0.0–1.2)
BUN / CREAT RATIO: 22 (ref 12–28)
BUN: 21 mg/dL (ref 8–27)
CHLORIDE: 97 mmol/L (ref 96–106)
CO2: 29 mmol/L (ref 18–29)
Calcium: 10.4 mg/dL — ABNORMAL HIGH (ref 8.7–10.3)
Creatinine, Ser: 0.95 mg/dL (ref 0.57–1.00)
GFR calc non Af Amer: 56 mL/min/{1.73_m2} — ABNORMAL LOW (ref >59)
GFR, EST AFRICAN AMERICAN: 65 mL/min/{1.73_m2} (ref >59)
GLOBULIN, TOTAL: 2.2 g/dL (ref 1.5–4.5)
GLUCOSE: 91 mg/dL (ref 65–99)
Potassium: 4.8 mmol/L (ref 3.5–5.2)
SODIUM: 141 mmol/L (ref 134–144)
TOTAL PROTEIN: 6.4 g/dL (ref 6.0–8.5)

## 2015-12-13 LAB — LIPID PANEL
CHOL/HDL RATIO: 1.7 ratio (ref 0.0–4.4)
Cholesterol, Total: 156 mg/dL (ref 100–199)
HDL: 90 mg/dL (ref >39)
LDL Calculated: 55 mg/dL (ref 0–99)
Triglycerides: 53 mg/dL (ref 0–149)
VLDL Cholesterol Cal: 11 mg/dL (ref 5–40)

## 2016-02-02 ENCOUNTER — Other Ambulatory Visit: Payer: Self-pay | Admitting: Nurse Practitioner

## 2016-02-02 DIAGNOSIS — I10 Essential (primary) hypertension: Secondary | ICD-10-CM

## 2016-02-02 DIAGNOSIS — E785 Hyperlipidemia, unspecified: Secondary | ICD-10-CM

## 2016-03-02 ENCOUNTER — Encounter: Payer: Self-pay | Admitting: Pharmacist

## 2016-03-02 ENCOUNTER — Ambulatory Visit (INDEPENDENT_AMBULATORY_CARE_PROVIDER_SITE_OTHER): Payer: Medicare Other | Admitting: Pharmacist

## 2016-03-02 VITALS — Ht 66.0 in | Wt 131.0 lb

## 2016-03-02 DIAGNOSIS — M810 Age-related osteoporosis without current pathological fracture: Secondary | ICD-10-CM

## 2016-03-02 MED ORDER — DENOSUMAB 60 MG/ML ~~LOC~~ SOLN
60.0000 mg | Freq: Once | SUBCUTANEOUS | Status: AC
Start: 2016-03-02 — End: 2016-03-02
  Administered 2016-03-02: 60 mg via SUBCUTANEOUS

## 2016-03-02 NOTE — Progress Notes (Signed)
Patient ID: Laura Acevedo, female   DOB: May 12, 1932, 83 y.o.   MRN: [id number]    CC: osteoporosis - due to have Prolia injection / treatment   HPI: Laura Acevedo has osteoporosis and has been receiving Prolia 60mg  SQ every 6 months since 2013.  She has tolerated well and her last injection was 08/10/217  She has taken Fosamax in the past but was stopped because had taken for 12 years.  No history of fractures                                                             PMH: Age at menopause:  40's Hysterectomy?  No Oophorectomy?  No HRT? No Steroid Use?  No Thyroid med?  No History of cancer?  Yes - melanoma / skin cancer History of digestive disorders (ie Crohn's)?  No Current or previous eating disorders?  No Last Vitamin D Result:  49.4 (09/2012) Last GFR Result:  56 (11/2015)   FH/SH: Family history of osteoporosis?  No Parent with history of hip fracture?  No Family history of breast cancer?  No Exercise?  No - active lifestyle / regular gardening Smoking?  No Alcohol?  No    Calcium Assessment Calcium Intake  # of servings/day  Calcium mg  Milk (8 oz) 2  x  300  = 600mg   Yogurt (4 oz) 0 x  200 = 0  Cheese (1 oz) 0 x  200 = 0  Other Calcium sources   250mg   Ca supplement mvi daily and Calcium daily = 1000mg    Estimated calcium intake per day 1850mg     DEXA Results Date of Test T-Score for AP Spine L1-L4 T-Score for Total Left Hip T-Score for Total Right Hip  09/25/2014 -2.7 -1.8 -1.9  09/20/2012 -2.6 -2.1 -1.9  09/09/2010 -3.0 -2.5 -2.2  03/08/2007 -3.1 -2.2 -2.0  12/28/2004 -3.1 -2.3 -2.1   Assessment: Osteoporosis with improved BMD over last 4 years.    Recom mendations: 1.  Continue prolia 60mg  q 6 months - administered in office today 60mg  SQ in left arm  Patient is due next DEXA 09/2016 - will assess continued need for treatment. 2.  Continue calcium 1200mg  daily through supplementation or diet.  3.  recommend weight bearing exercise - 30 minutes at least  4 days per week.   4.  Counseled and educated about fall risk and prevention.   Time spent counseling patient:  20 minutes   Cherre Robins, PharmD, CPP

## 2016-03-10 ENCOUNTER — Other Ambulatory Visit: Payer: Self-pay | Admitting: Dermatology

## 2016-03-10 DIAGNOSIS — L821 Other seborrheic keratosis: Secondary | ICD-10-CM | POA: Diagnosis not present

## 2016-03-10 DIAGNOSIS — Z85828 Personal history of other malignant neoplasm of skin: Secondary | ICD-10-CM | POA: Diagnosis not present

## 2016-03-10 DIAGNOSIS — C44112 Basal cell carcinoma of skin of right eyelid, including canthus: Secondary | ICD-10-CM | POA: Diagnosis not present

## 2016-03-10 DIAGNOSIS — L905 Scar conditions and fibrosis of skin: Secondary | ICD-10-CM | POA: Diagnosis not present

## 2016-04-21 DIAGNOSIS — Z85828 Personal history of other malignant neoplasm of skin: Secondary | ICD-10-CM | POA: Diagnosis not present

## 2016-04-21 DIAGNOSIS — L905 Scar conditions and fibrosis of skin: Secondary | ICD-10-CM | POA: Diagnosis not present

## 2016-04-21 DIAGNOSIS — L570 Actinic keratosis: Secondary | ICD-10-CM | POA: Diagnosis not present

## 2016-05-12 ENCOUNTER — Encounter: Payer: Self-pay | Admitting: Nurse Practitioner

## 2016-05-12 ENCOUNTER — Ambulatory Visit (INDEPENDENT_AMBULATORY_CARE_PROVIDER_SITE_OTHER): Payer: Medicare Other | Admitting: Nurse Practitioner

## 2016-05-12 VITALS — BP 125/70 | HR 64 | Temp 96.8°F | Ht 66.0 in | Wt 130.0 lb

## 2016-05-12 DIAGNOSIS — M810 Age-related osteoporosis without current pathological fracture: Secondary | ICD-10-CM | POA: Diagnosis not present

## 2016-05-12 DIAGNOSIS — E782 Mixed hyperlipidemia: Secondary | ICD-10-CM

## 2016-05-12 DIAGNOSIS — I10 Essential (primary) hypertension: Secondary | ICD-10-CM

## 2016-05-12 MED ORDER — ATORVASTATIN CALCIUM 20 MG PO TABS: 20.0000 mg | ORAL_TABLET | Freq: Every day | ORAL | 1 refills | Status: DC

## 2016-05-12 MED ORDER — LISINOPRIL-HYDROCHLOROTHIAZIDE 10-12.5 MG PO TABS: 1.0000 | ORAL_TABLET | Freq: Every day | ORAL | 1 refills | Status: DC

## 2016-05-12 NOTE — Progress Notes (Signed)
Subjective:    Patient ID: Laura Acevedo, female    DOB: [birth year], 83 y.o.   MRN: [id number]  HPI  Laura Acevedo is here today for follow up of chronic medical problem.  Outpatient Encounter Prescriptions as of 05/12/2016  Medication Sig  . atorvastatin (LIPITOR) 20 MG tablet TAKE 1 TABLET BY MOUTH  DAILY  . calcium carbonate (OS-CAL) 600 MG TABS Take 600 mg by mouth daily. AT LUNCH  . cholecalciferol (VITAMIN D) 1000 UNITS tablet Take 1,000 Units by mouth daily.   Marland Kitchen denosumab (PROLIA) 60 MG/ML SOLN injection Inject 60 mg into the skin every 6 (six) months. Administer in upper arm, thigh, or abdomen  . lisinopril-hydrochlorothiazide (PRINZIDE,ZESTORETIC) 10-12.5 MG tablet TAKE 1 TABLET BY MOUTH  DAILY  . Multiple Vitamins-Minerals (CENTRUM SILVER PO) Take 1 tablet by mouth every morning.  . Multiple Vitamins-Minerals (PRESERVISION AREDS 2 PO) Take by mouth 2 (two) times daily.   No facility-administered encounter medications on file as of 05/12/2016.     1. Essential hypertension, benign  No c/o chest pain,SOB or headaches- does not check blood pressures at home  2. Age-related osteoporosis without current pathological fracture  She is currently on prolia- no c/o back pain  3. Mixed hyperlipidemia  Watches diet daily     New complaints: None today     Review of Systems  Constitutional: Negative for diaphoresis.  Eyes: Negative for pain.  Respiratory: Negative for shortness of breath.   Cardiovascular: Negative for chest pain, palpitations and leg swelling.  Gastrointestinal: Negative for abdominal pain.  Endocrine: Negative for polydipsia.  Skin: Negative for rash.  Neurological: Negative for dizziness, weakness and headaches.  Hematological: Does not bruise/bleed easily.       Objective:   Physical Exam  Constitutional: She is oriented to person, place, and time. She appears well-developed and well-nourished.  HENT:  Nose: Nose normal.  Mouth/Throat: Oropharynx is  clear and moist.  Eyes: EOM are normal.  Neck: Trachea normal, normal range of motion and full passive range of motion without pain. Neck supple. No JVD present. Carotid bruit is not present. No thyromegaly present.  Cardiovascular: Normal rate, regular rhythm, normal heart sounds and intact distal pulses.  Exam reveals no gallop and no friction rub.   No murmur heard. Pulmonary/Chest: Effort normal and breath sounds normal.  Abdominal: Soft. Bowel sounds are normal. She exhibits no distension and no mass. There is no tenderness.  Musculoskeletal: Normal range of motion.  Lymphadenopathy:    She has no cervical adenopathy.  Neurological: She is alert and oriented to person, place, and time. She has normal reflexes.  Skin: Skin is warm and dry.  Psychiatric: She has a normal mood and affect. Her behavior is normal. Judgment and thought content normal.   BP 125/70   Pulse 64   Temp (!) 96.8 F (36 C) (Oral)   Ht '5\' 6"'$  (1.676 m)   Wt 130 lb (59 kg)   BMI 20.98 kg/m         Assessment & Plan:  1. Essential hypertension, benign Low sodium diet - lisinopril-hydrochlorothiazide (PRINZIDE,ZESTORETIC) 10-12.5 MG tablet; Take 1 tablet by mouth daily.  Dispense: 90 tablet; Refill: 1 - CMP14+EGFR  2. Age-related osteoporosis without current pathological fracture Weight bearing exrcise  3. Mixed hyperlipidemia Low fat diet - atorvastatin (LIPITOR) 20 MG tablet; Take 1 tablet (20 mg total) by mouth daily.  Dispense: 90 tablet; Refill: 1 - Lipid panel   Patient will schedule mammogram Labs  pending Health maintenance reviewed Diet and exercise encouraged Continue all meds Follow up  In 6 months   [place], FNP

## 2016-05-12 NOTE — Patient Instructions (Signed)
Fall Prevention in the Home Falls can cause injuries. They can happen to people of all ages. There are many things you can do to make your home safe and to help prevent falls. What can I do on the outside of my home?  Regularly fix the edges of walkways and driveways and fix any cracks.  Remove anything that might make you trip as you walk through a door, such as a raised step or threshold.  Trim any bushes or trees on the path to your home.  Use bright outdoor lighting.  Clear any walking paths of anything that might make someone trip, such as rocks or tools.  Regularly check to see if handrails are loose or broken. Make sure that both sides of any steps have handrails.  Any raised decks and porches should have guardrails on the edges.  Have any leaves, snow, or ice cleared regularly.  Use sand or salt on walking paths during winter.  Clean up any spills in your garage right away. This includes oil or grease spills. What can I do in the bathroom?  Use night lights.  Install grab bars by the toilet and in the tub and shower. Do not use towel bars as grab bars.  Use non-skid mats or decals in the tub or shower.  If you need to sit down in the shower, use a plastic, non-slip stool.  Keep the floor dry. Clean up any water that spills on the floor as soon as it happens.  Remove soap buildup in the tub or shower regularly.  Attach bath mats securely with double-sided non-slip rug tape.  Do not have throw rugs and other things on the floor that can make you trip. What can I do in the bedroom?  Use night lights.  Make sure that you have a light by your bed that is easy to reach.  Do not use any sheets or blankets that are too big for your bed. They should not hang down onto the floor.  Have a firm chair that has side arms. You can use this for support while you get dressed.  Do not have throw rugs and other things on the floor that can make you trip. What can I do in the  kitchen?  Clean up any spills right away.  Avoid walking on wet floors.  Keep items that you use a lot in easy-to-reach places.  If you need to reach something above you, use a strong step stool that has a grab bar.  Keep electrical cords out of the way.  Do not use floor polish or wax that makes floors slippery. If you must use wax, use non-skid floor wax.  Do not have throw rugs and other things on the floor that can make you trip. What can I do with my stairs?  Do not leave any items on the stairs.  Make sure that there are handrails on both sides of the stairs and use them. Fix handrails that are broken or loose. Make sure that handrails are as long as the stairways.  Check any carpeting to make sure that it is firmly attached to the stairs. Fix any carpet that is loose or worn.  Avoid having throw rugs at the top or bottom of the stairs. If you do have throw rugs, attach them to the floor with carpet tape.  Make sure that you have a light switch at the top of the stairs and the bottom of the stairs. If you do   not have them, ask someone to add them for you. What else can I do to help prevent falls?  Wear shoes that:  Do not have high heels.  Have rubber bottoms.  Are comfortable and fit you well.  Are closed at the toe. Do not wear sandals.  If you use a stepladder:  Make sure that it is fully opened. Do not climb a closed stepladder.  Make sure that both sides of the stepladder are locked into place.  Ask someone to hold it for you, if possible.  Clearly mark and make sure that you can see:  Any grab bars or handrails.  First and last steps.  Where the edge of each step is.  Use tools that help you move around (mobility aids) if they are needed. These include:  Canes.  Walkers.  Scooters.  Crutches.  Turn on the lights when you go into a dark area. Replace any light bulbs as soon as they burn out.  Set up your furniture so you have a clear path.  Avoid moving your furniture around.  If any of your floors are uneven, fix them.  If there are any pets around you, be aware of where they are.  Review your medicines with your doctor. Some medicines can make you feel dizzy. This can increase your chance of falling. Ask your doctor what other things that you can do to help prevent falls. This information is not intended to replace advice given to you by your health care provider. Make sure you discuss any questions you have with your health care provider. Document Released: 10/31/2008 Document Revised: 06/12/2015 Document Reviewed: 02/08/2014 Elsevier Interactive Patient Education  2017 [employer]  

## 2016-05-13 LAB — LIPID PANEL
CHOLESTEROL TOTAL: 161 mg/dL (ref 100–199)
Chol/HDL Ratio: 1.7 ratio (ref 0.0–4.4)
HDL: 95 mg/dL (ref >39)
LDL CALC: 51 mg/dL (ref 0–99)
TRIGLYCERIDES: 75 mg/dL (ref 0–149)
VLDL CHOLESTEROL CAL: 15 mg/dL (ref 5–40)

## 2016-05-13 LAB — CMP14+EGFR
ALBUMIN: 4.4 g/dL (ref 3.5–4.7)
ALK PHOS: 49 IU/L (ref 39–117)
ALT: 25 IU/L (ref 0–32)
AST: 25 IU/L (ref 0–40)
Albumin/Globulin Ratio: 1.8 (ref 1.2–2.2)
BUN / CREAT RATIO: 19 (ref 12–28)
BUN: 19 mg/dL (ref 8–27)
Bilirubin Total: 0.9 mg/dL (ref 0.0–1.2)
CO2: 27 mmol/L (ref 18–29)
CREATININE: 1.01 mg/dL — AB (ref 0.57–1.00)
Calcium: 9.8 mg/dL (ref 8.7–10.3)
Chloride: 99 mmol/L (ref 96–106)
GFR calc Af Amer: 60 mL/min/{1.73_m2} (ref >59)
GFR calc non Af Amer: 52 mL/min/{1.73_m2} — ABNORMAL LOW (ref >59)
GLUCOSE: 87 mg/dL (ref 65–99)
Globulin, Total: 2.4 g/dL (ref 1.5–4.5)
Potassium: 4.1 mmol/L (ref 3.5–5.2)
Sodium: 143 mmol/L (ref 134–144)
Total Protein: 6.8 g/dL (ref 6.0–8.5)

## 2016-06-03 DIAGNOSIS — H353131 Nonexudative age-related macular degeneration, bilateral, early dry stage: Secondary | ICD-10-CM | POA: Diagnosis not present

## 2016-07-14 ENCOUNTER — Encounter: Payer: Medicare Other | Admitting: *Deleted

## 2016-07-14 DIAGNOSIS — Z1231 Encounter for screening mammogram for malignant neoplasm of breast: Secondary | ICD-10-CM | POA: Diagnosis not present

## 2016-07-28 DIAGNOSIS — L570 Actinic keratosis: Secondary | ICD-10-CM | POA: Diagnosis not present

## 2016-07-28 DIAGNOSIS — D225 Melanocytic nevi of trunk: Secondary | ICD-10-CM | POA: Diagnosis not present

## 2016-07-28 DIAGNOSIS — L300 Nummular dermatitis: Secondary | ICD-10-CM | POA: Diagnosis not present

## 2016-07-28 DIAGNOSIS — Z85828 Personal history of other malignant neoplasm of skin: Secondary | ICD-10-CM | POA: Diagnosis not present

## 2016-09-09 ENCOUNTER — Encounter: Payer: Self-pay | Admitting: *Deleted

## 2016-09-27 ENCOUNTER — Encounter: Payer: Self-pay | Admitting: *Deleted

## 2016-09-27 ENCOUNTER — Ambulatory Visit (INDEPENDENT_AMBULATORY_CARE_PROVIDER_SITE_OTHER): Payer: Medicare Other | Admitting: *Deleted

## 2016-09-27 ENCOUNTER — Ambulatory Visit (INDEPENDENT_AMBULATORY_CARE_PROVIDER_SITE_OTHER): Payer: Medicare Other

## 2016-09-27 VITALS — BP 125/68 | HR 62 | Ht 66.0 in | Wt 130.0 lb

## 2016-09-27 DIAGNOSIS — Z0000 Encounter for general adult medical examination without abnormal findings: Secondary | ICD-10-CM | POA: Diagnosis not present

## 2016-09-27 DIAGNOSIS — Z780 Asymptomatic menopausal state: Secondary | ICD-10-CM

## 2016-09-27 NOTE — Patient Instructions (Addendum)
  [name] , Thank you for taking time to come for your Medicare Wellness Visit. I appreciate your ongoing commitment to your health goals. Please review the following plan we discussed and let me know if I can assist you in the future.   These are the goals we discussed: Goals    . Exercise 3x per week (30 min per time)       This is a list of the screening recommended for you and due dates:  Health Maintenance  Topic Date Due  . Flu Shot  08/18/2016  . Tetanus Vaccine  11/11/2016*  . Mammogram  07/14/2017  . DEXA scan (bone density measurement)  09/28/2018  . Pneumonia vaccines  Completed  *Topic was postponed. The date shown is not the original due date.   Move carefully to avoid falls [name] will review dexa scan results at your next visit. You will receive your Prolia shot and flu shot at your next visit with [name].

## 2016-09-30 NOTE — Progress Notes (Signed)
Subjective:   Laura Acevedo is a 83 y.o. female who presents for Medicare Annual (Subsequent) preventive examination. Laura Acevedo lives at home with her husband. She enjoys gardening and stays busy during the day with house work.  Review of Systems:  Reports that her health is about the same as last year.   Cardiac Risk Factors include: advanced age (>53men, >29 women);hypertension;dyslipidemia  Other systems negative today.     Objective:     Vitals: BP 125/68 (BP Location: Left Arm, Patient Position: Sitting, Cuff Size: Normal)   Pulse 62   Ht 5\' 6"  (1.676 m)   Wt 130 lb (59 kg)   BMI 20.98 kg/m   Body mass index is 20.98 kg/m.   Tobacco History  Smoking Status  . Never Smoker  Smokeless Tobacco  . Never Used     No tobacco use  Past Medical History:  Diagnosis Date  . Cancer ([place]) 1980   melanoma  . Cataract   . Colon polyps   . 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 Hyperlipidemia   . Hypertension   . Osteoporosis    Past Surgical History:  Procedure Laterality Date  . CATARACT EXTRACTION    . FOOT NEUROMA SURGERY Left 2005   Dr Irving Shows  . MELANOMA EXCISION Left    leg   Family History  Problem Relation Age of Onset  . Diabetes Mother   . Heart disease Mother   . Heart disease Father   . Heart attack Father   . Kidney disease Father   . Hypertension Sister   . Diabetes Brother   . Hypertension Brother   . Cancer Brother        bladder   . Diabetes Brother   . Diabetes Brother   . Colon cancer Neg Hx    History  Sexual Activity  . Sexual activity: Yes    Outpatient Encounter Prescriptions as of 09/27/2016  Medication Sig  . atorvastatin (LIPITOR) 20 MG tablet Take 1 tablet (20 mg total) by mouth daily.  . calcium carbonate (OS-CAL) 600 MG TABS Take 600 mg by mouth daily. AT LUNCH  . cholecalciferol (VITAMIN D) 1000 UNITS tablet Take 1,000 Units by mouth daily.   Marland Kitchen denosumab (PROLIA) 60 MG/ML SOLN injection Inject 60 mg into the skin every 6 (six)  months. Administer in upper arm, thigh, or abdomen  . lisinopril-hydrochlorothiazide (PRINZIDE,ZESTORETIC) 10-12.5 MG tablet Take 1 tablet by mouth daily.  . Multiple Vitamins-Minerals (CENTRUM SILVER PO) Take 1 tablet by mouth every morning.  . Multiple Vitamins-Minerals (PRESERVISION AREDS 2 PO) Take by mouth 2 (two) times daily.   No facility-administered encounter medications on file as of 09/27/2016.     Activities of Daily Living In your present state of health, do you have any difficulty performing the following activities: 09/27/2016 05/12/2016  Hearing? N N  Vision? N Y  Difficulty concentrating or making decisions? N N  Walking or climbing stairs? N N  Dressing or bathing? N N  Doing errands, shopping? N N  Preparing Food and eating ? N -  Using the Toilet? N -  In the past six months, have you accidently leaked urine? N -  Do you have problems with loss of bowel control? N -  Managing your Medications? N -  Managing your Finances? N -  Housekeeping or managing your Housekeeping? N -  Some recent data might be hidden    Patient Care Team: Chevis Pretty, FNP as PCP -  General (Nurse Practitioner) Calvert Cantor, MD as Consulting Physician (Ophthalmology) Druscilla Brownie, MD as Consulting Physician (Dermatology) Inda Castle, MD as Consulting Physician (Gastroenterology)    Assessment:    Exercise Activities and Dietary recommendations Current Exercise Habits: The patient does not participate in regular exercise at present (Stays active all day in home and garden), Exercise limited by: None identified  Diet: Eats 3 meals a day  Goals    . Exercise 3x per week (30 Acevedo per time)      Fall Risk Fall Risk  09/27/2016 05/12/2016 12/12/2015 09/09/2015 08/28/2015  Falls in the past year? No No No No No   Depression Screen PHQ 2/9 Scores 09/27/2016 05/12/2016 12/12/2015 09/09/2015  PHQ - 2 Score 0 0 0 0     Cognitive Function MMSE - Mini Mental State Exam  09/27/2016 08/28/2015 08/28/2015 08/23/2014  Orientation to time 5 5 5 5   Orientation to Place 5 5 5 5   Registration 3 3 3 3   Attention/ Calculation 5 5 5 5   Recall 3 2 3 3   Language- name 2 objects 2 2 2 2   Language- repeat 1 1 1 1   Language- follow 3 step command 3 3 3 3   Language- read & follow direction 1 1 1 1   Write a sentence 1 1 1 1   Copy design 1 1 1 1   Total score 30 29 30 30     normal exam    Immunization History  Administered Date(s) Administered  . Influenza,inj,Quad PF,6+ Mos 11/22/2012, 11/05/2013, 10/30/2014, 11/04/2015  . Pneumococcal Conjugate-13 11/05/2013  . Pneumococcal Polysaccharide-23 11/19/2014   Screening Tests Health Maintenance  Topic Date Due  . INFLUENZA VACCINE  08/18/2016  . TETANUS/TDAP  11/11/2016 (Originally 11/18/2012)  . MAMMOGRAM  07/14/2017  . DEXA SCAN  09/28/2018  . PNA vac Low Risk Adult  Completed      Plan:  Continue to stay active.  Move carefully to avoid falls.  You had a dexa scan today. Laura Acevedo will review those results with you at your next visit. Keep your follow up appt with Laura Acevedo.  You will receive your Prolia injection at your next visit as well.   I have personally reviewed and noted the following in the patient's chart:   . Medical and social history . Use of alcohol, tobacco or illicit drugs  . Current medications and supplements . Functional ability and status . Nutritional status . Physical activity . Advanced directives . List of other physicians . Hospitalizations, surgeries, and ER visits in previous 12 months . Vitals . Screenings to include cognitive, depression, and falls . Referrals and appointments  In addition, I have reviewed and discussed with patient certain preventive protocols, quality metrics, and best practice recommendations. A written personalized care plan for preventive services as well as general preventive health recommendations were provided to patient.     Chong Sicilian, RN 09/30/2016  I have reviewed and agree with the above AWV documentation.   Malley-Margaret Hassell Done, FNP

## 2016-11-04 ENCOUNTER — Ambulatory Visit (INDEPENDENT_AMBULATORY_CARE_PROVIDER_SITE_OTHER): Payer: Medicare Other

## 2016-11-04 DIAGNOSIS — Z23 Encounter for immunization: Secondary | ICD-10-CM | POA: Diagnosis not present

## 2016-11-11 ENCOUNTER — Ambulatory Visit (INDEPENDENT_AMBULATORY_CARE_PROVIDER_SITE_OTHER): Payer: Medicare Other | Admitting: Nurse Practitioner

## 2016-11-11 ENCOUNTER — Encounter: Payer: Self-pay | Admitting: Nurse Practitioner

## 2016-11-11 VITALS — BP 129/61 | HR 63 | Temp 97.0°F | Ht 66.0 in | Wt 129.0 lb

## 2016-11-11 DIAGNOSIS — I10 Essential (primary) hypertension: Secondary | ICD-10-CM | POA: Diagnosis not present

## 2016-11-11 DIAGNOSIS — M810 Age-related osteoporosis without current pathological fracture: Secondary | ICD-10-CM | POA: Diagnosis not present

## 2016-11-11 DIAGNOSIS — E782 Mixed hyperlipidemia: Secondary | ICD-10-CM

## 2016-11-11 LAB — LIPID PANEL
CHOL/HDL RATIO: 1.7 ratio (ref 0.0–4.4)
CHOLESTEROL TOTAL: 150 mg/dL (ref 100–199)
HDL: 88 mg/dL (ref >39)
LDL Calculated: 47 mg/dL (ref 0–99)
TRIGLYCERIDES: 74 mg/dL (ref 0–149)
VLDL Cholesterol Cal: 15 mg/dL (ref 5–40)

## 2016-11-11 LAB — CMP14+EGFR
A/G RATIO: 1.9 (ref 1.2–2.2)
ALK PHOS: 67 IU/L (ref 39–117)
ALT: 28 IU/L (ref 0–32)
AST: 27 IU/L (ref 0–40)
Albumin: 4.4 g/dL (ref 3.5–4.7)
BILIRUBIN TOTAL: 1 mg/dL (ref 0.0–1.2)
BUN/Creatinine Ratio: 18 (ref 12–28)
BUN: 20 mg/dL (ref 8–27)
CHLORIDE: 100 mmol/L (ref 96–106)
CO2: 26 mmol/L (ref 20–29)
Calcium: 10.3 mg/dL (ref 8.7–10.3)
Creatinine, Ser: 1.11 mg/dL — ABNORMAL HIGH (ref 0.57–1.00)
GFR calc Af Amer: 53 mL/min/{1.73_m2} — ABNORMAL LOW (ref >59)
GFR, EST NON AFRICAN AMERICAN: 46 mL/min/{1.73_m2} — AB (ref >59)
GLOBULIN, TOTAL: 2.3 g/dL (ref 1.5–4.5)
Glucose: 89 mg/dL (ref 65–99)
POTASSIUM: 4.7 mmol/L (ref 3.5–5.2)
SODIUM: 143 mmol/L (ref 134–144)
Total Protein: 6.7 g/dL (ref 6.0–8.5)

## 2016-11-11 MED ORDER — ATORVASTATIN CALCIUM 20 MG PO TABS: 20.0000 mg | ORAL_TABLET | Freq: Every day | ORAL | 1 refills | Status: DC

## 2016-11-11 MED ORDER — LISINOPRIL-HYDROCHLOROTHIAZIDE 10-12.5 MG PO TABS: 1.0000 | ORAL_TABLET | Freq: Every day | ORAL | 1 refills | Status: DC

## 2016-11-11 NOTE — Patient Instructions (Signed)

## 2016-11-11 NOTE — Progress Notes (Signed)
Subjective:    Patient ID: Laura Acevedo, female    DOB: [birth year], 83 y.o.   MRN: [id number]  HPI  Laura Acevedo is here today for follow up of chronic medical problem.  Outpatient Encounter Prescriptions as of 11/11/2016  Medication Sig  . atorvastatin (LIPITOR) 20 MG tablet Take 1 tablet (20 mg total) by mouth daily.  . calcium carbonate (OS-CAL) 600 MG TABS Take 600 mg by mouth daily. AT LUNCH  . cholecalciferol (VITAMIN D) 1000 UNITS tablet Take 1,000 Units by mouth daily.   Marland Kitchen denosumab (PROLIA) 60 MG/ML SOLN injection Inject 60 mg into the skin every 6 (six) months. Administer in upper arm, thigh, or abdomen  . lisinopril-hydrochlorothiazide (PRINZIDE,ZESTORETIC) 10-12.5 MG tablet Take 1 tablet by mouth daily.  . Multiple Vitamins-Minerals (CENTRUM SILVER PO) Take 1 tablet by mouth every morning.  . Multiple Vitamins-Minerals (PRESERVISION AREDS 2 PO) Take by mouth 2 (two) times daily.   No facility-administered encounter medications on file as of 11/11/2016.     1. Essential hypertension, benign  No c/o chest pain, sob or headache. Does not check blood pressure at home. BP Readings from Last 3 Encounters:  11/11/16 129/61  09/27/16 125/68  05/12/16 125/70     2. Age-related osteoporosis without current pathological fracture  Does no dedicated weight bearing exercise at all. No c/o back pain. Last dexa scan was in sept 2018 in which t-score was -2.5  3. Mixed hyperlipidemia  Avoids fried foods    New complaints: None today  Social history: Lives and takes care of her husband     Review of Systems  Constitutional: Negative for activity change and appetite change.  HENT: Negative.   Eyes: Negative for pain.  Respiratory: Negative for shortness of breath.   Cardiovascular: Negative for chest pain, palpitations and leg swelling.  Gastrointestinal: Negative for abdominal pain.  Endocrine: Negative for polydipsia.  Genitourinary: Negative.   Skin: Negative for rash.   Neurological: Negative for dizziness, weakness and headaches.  Hematological: Does not bruise/bleed easily.  Psychiatric/Behavioral: Negative.   All other systems reviewed and are negative.      Objective:   Physical Exam  Constitutional: She is oriented to person, place, and time. She appears well-developed and well-nourished.  HENT:  Nose: Nose normal.  Mouth/Throat: Oropharynx is clear and moist.  Eyes: EOM are normal.  Neck: Trachea normal, normal range of motion and full passive range of motion without pain. Neck supple. No JVD present. Carotid bruit is not present. No thyromegaly present.  Cardiovascular: Normal rate, regular rhythm, normal heart sounds and intact distal pulses.  Exam reveals no gallop and no friction rub.   No murmur heard. Pulmonary/Chest: Effort normal and breath sounds normal.  Abdominal: Soft. Bowel sounds are normal. She exhibits no distension and no mass. There is no tenderness.  Musculoskeletal: Normal range of motion.  Lymphadenopathy:    She has no cervical adenopathy.  Neurological: She is alert and oriented to person, place, and time. She has normal reflexes.  Skin: Skin is warm and dry.  Psychiatric: She has a normal mood and affect. Her behavior is normal. Judgment and thought content normal.    BP 129/61   Pulse 63   Temp (!) 97 F (36.1 C) (Oral)   Ht _0  (1.676 m)   Wt 129 lb (58.5 kg)   BMI 20.82 kg/m        Assessment & Plan:  1. Essential hypertension, benign Continue low sodium diet -  lisinopril-hydrochlorothiazide (PRINZIDE,ZESTORETIC) 10-12.5 MG tablet; Take 1 tablet by mouth daily.  Dispense: 90 tablet; Refill: 1 - CMP14+EGFR  2. Age-related osteoporosis without current pathological fracture Weight bearing exercises encouraged  3. Mixed hyperlipidemia Low fat diet - atorvastatin (LIPITOR) 20 MG tablet; Take 1 tablet (20 mg total) by mouth daily.  Dispense: 90 tablet; Refill: 1 - Lipid panel    Labs  pending Health maintenance reviewed Diet and exercise encouraged Continue all meds Follow up  In 6 months   [place], FNP

## 2016-11-12 DIAGNOSIS — M810 Age-related osteoporosis without current pathological fracture: Secondary | ICD-10-CM | POA: Diagnosis not present

## 2016-11-12 MED ORDER — DENOSUMAB 60 MG/ML ~~LOC~~ SOLN
60.0000 mg | Freq: Once | SUBCUTANEOUS | Status: AC
  Administered 2016-11-12: 60 mg via SUBCUTANEOUS

## 2016-11-12 NOTE — Addendum Note (Signed)
Addended by: Rolena Infante on: 11/12/2016 04:35 PM   Modules accepted: Orders

## 2017-01-27 ENCOUNTER — Other Ambulatory Visit: Payer: Self-pay | Admitting: Dermatology

## 2017-01-27 DIAGNOSIS — C44319 Basal cell carcinoma of skin of other parts of face: Secondary | ICD-10-CM | POA: Diagnosis not present

## 2017-01-27 DIAGNOSIS — C441121 Basal cell carcinoma of skin of right upper eyelid, including canthus: Secondary | ICD-10-CM | POA: Diagnosis not present

## 2017-02-22 ENCOUNTER — Encounter: Payer: Self-pay | Admitting: Nurse Practitioner

## 2017-02-22 ENCOUNTER — Ambulatory Visit (INDEPENDENT_AMBULATORY_CARE_PROVIDER_SITE_OTHER): Payer: Medicare Other

## 2017-02-22 ENCOUNTER — Ambulatory Visit (INDEPENDENT_AMBULATORY_CARE_PROVIDER_SITE_OTHER): Payer: Medicare Other | Admitting: Nurse Practitioner

## 2017-02-22 VITALS — BP 135/72 | HR 77 | Temp 96.6°F | Ht 66.0 in | Wt 128.0 lb

## 2017-02-22 DIAGNOSIS — R5383 Other fatigue: Secondary | ICD-10-CM

## 2017-02-22 NOTE — Patient Instructions (Signed)

## 2017-02-22 NOTE — Progress Notes (Signed)
   Subjective:    Patient ID: Laura Acevedo, female    DOB: 10-29-1932, 84 y.o.   MRN: [id number]  HPI Patuent come sin today c/o of feeling tired and exhausted. This started after christmas but seems to have worsened. She says she can feel her heart beating more then usual and sometimes she feels like she just needs to take a deep breathe. Not really SOB. Denies depression.    Review of Systems  Constitutional: Positive for fatigue. Negative for appetite change.  HENT: Negative.   Respiratory: Negative.  Negative for shortness of breath.   Cardiovascular: Negative.  Negative for chest pain, palpitations and leg swelling.  Genitourinary: Negative.   Neurological: Positive for light-headedness (when she gets up to walk.). Negative for dizziness.  Psychiatric/Behavioral: Negative.   All other systems reviewed and are negative.      Objective:   Physical Exam  Constitutional: She is oriented to person, place, and time. She appears well-developed and well-nourished. No distress.  Cardiovascular: Normal rate and regular rhythm.  Pulmonary/Chest: Effort normal and breath sounds normal.  Abdominal: Soft. Bowel sounds are normal.  Neurological: She is alert and oriented to person, place, and time.  Skin: Skin is warm.  Psychiatric: She has a normal mood and affect. Her behavior is normal. Judgment and thought content normal.   BP 135/72   Pulse 77   Temp (!) 96.6 F (35.9 C) (Oral)   Ht '5\' 6"'$  (1.676 m)   Wt 128 lb (58.1 kg)   BMI 20.66 kg/m    ekg ATRIAL [place], FNP  CHEST X RAY- normal    Assessment & Plan:   1. Fatigue, unspecified type    Orders Placed This Encounter  Procedures  . DG Chest 2 View    Standing Status:   Future    Number of Occurrences:   1    Standing Expiration Date:   04/24/2018    Order Specific Question:   Reason for Exam (SYMPTOM  OR DIAGNOSIS REQUIRED)    Answer:   fatigue    Order Specific Question:   Preferred imaging  location?    Answer:   Internal  . Anemia Profile B  . CMP14+EGFR  . Thyroid Panel With TSH  . EKG 12-Lead   Labs pending Will call with lab results  [place], FNP

## 2017-02-23 LAB — CMP14+EGFR
ALBUMIN: 4.3 g/dL (ref 3.5–4.7)
ALK PHOS: 61 IU/L (ref 39–117)
ALT: 31 IU/L (ref 0–32)
AST: 29 IU/L (ref 0–40)
Albumin/Globulin Ratio: 1.9 (ref 1.2–2.2)
BUN/Creatinine Ratio: 20 (ref 12–28)
BUN: 21 mg/dL (ref 8–27)
Bilirubin Total: 0.9 mg/dL (ref 0.0–1.2)
CO2: 24 mmol/L (ref 20–29)
CREATININE: 1.03 mg/dL — AB (ref 0.57–1.00)
Calcium: 9.9 mg/dL (ref 8.7–10.3)
Chloride: 101 mmol/L (ref 96–106)
GFR calc Af Amer: 58 mL/min/{1.73_m2} — ABNORMAL LOW (ref >59)
GFR calc non Af Amer: 51 mL/min/{1.73_m2} — ABNORMAL LOW (ref >59)
GLUCOSE: 98 mg/dL (ref 65–99)
Globulin, Total: 2.3 g/dL (ref 1.5–4.5)
Potassium: 4.5 mmol/L (ref 3.5–5.2)
Sodium: 142 mmol/L (ref 134–144)
Total Protein: 6.6 g/dL (ref 6.0–8.5)

## 2017-02-23 LAB — ANEMIA PROFILE B
BASOS ABS: 0 10*3/uL (ref 0.0–0.2)
Basos: 0 %
EOS (ABSOLUTE): 0 10*3/uL (ref 0.0–0.4)
Eos: 0 %
Ferritin: 127 ng/mL (ref 15–150)
Hematocrit: 41.1 % (ref 34.0–46.6)
Hemoglobin: 13.9 g/dL (ref 11.1–15.9)
IMMATURE GRANULOCYTES: 0 %
IRON: 107 ug/dL (ref 27–139)
Immature Grans (Abs): 0 10*3/uL (ref 0.0–0.1)
Iron Saturation: 38 % (ref 15–55)
LYMPHS ABS: 1.4 10*3/uL (ref 0.7–3.1)
Lymphs: 18 %
MCH: 32.9 pg (ref 26.6–33.0)
MCHC: 33.8 g/dL (ref 31.5–35.7)
MCV: 97 fL (ref 79–97)
Monocytes Absolute: 0.7 10*3/uL (ref 0.1–0.9)
Monocytes: 10 %
NEUTROS PCT: 72 %
Neutrophils Absolute: 5.2 10*3/uL (ref 1.4–7.0)
PLATELETS: 226 10*3/uL (ref 150–379)
RBC: 4.22 x10E6/uL (ref 3.77–5.28)
RDW: 13.5 % (ref 12.3–15.4)
Retic Ct Pct: 0.8 % (ref 0.6–2.6)
TIBC: 278 ug/dL (ref 250–450)
UIBC: 171 ug/dL (ref 118–369)
Vitamin B-12: 499 pg/mL (ref 232–1245)
WBC: 7.3 10*3/uL (ref 3.4–10.8)

## 2017-02-23 LAB — THYROID PANEL WITH TSH
Free Thyroxine Index: 1.8 (ref 1.2–4.9)
T3 UPTAKE RATIO: 28 % (ref 24–39)
T4, Total: 6.4 ug/dL (ref 4.5–12.0)
TSH: 1.09 u[IU]/mL (ref 0.450–4.500)

## 2017-02-25 ENCOUNTER — Telehealth: Payer: Self-pay | Admitting: Nurse Practitioner

## 2017-02-25 NOTE — Telephone Encounter (Signed)
LMTCB

## 2017-02-25 NOTE — Telephone Encounter (Signed)
She does ot have to if she doesn't want to. Heart rate is just slow

## 2017-03-14 NOTE — Telephone Encounter (Signed)
Patient aware.

## 2017-04-05 DIAGNOSIS — L905 Scar conditions and fibrosis of skin: Secondary | ICD-10-CM | POA: Diagnosis not present

## 2017-04-05 DIAGNOSIS — L570 Actinic keratosis: Secondary | ICD-10-CM | POA: Diagnosis not present

## 2017-04-05 DIAGNOSIS — Z85828 Personal history of other malignant neoplasm of skin: Secondary | ICD-10-CM | POA: Diagnosis not present

## 2017-04-08 ENCOUNTER — Ambulatory Visit (INDEPENDENT_AMBULATORY_CARE_PROVIDER_SITE_OTHER): Payer: Medicare Other | Admitting: Physician Assistant

## 2017-04-08 ENCOUNTER — Encounter: Payer: Self-pay | Admitting: Physician Assistant

## 2017-04-08 DIAGNOSIS — R634 Abnormal weight loss: Secondary | ICD-10-CM

## 2017-04-10 NOTE — Progress Notes (Signed)
BP (!) 151/78   Pulse 82   Temp (!) 97.5 F (36.4 C) (Oral)   Ht _0  (1.676 m)   Wt 123 lb (55.8 kg)   BMI 19.85 kg/m    Subjective:    Patient ID: Laura Acevedo, female    DOB: Sep 08, 1932, 84 y.o.   MRN: [id number]  HPI: Laura Acevedo is a 84 y.o. female presenting on 04/08/2017 for Fatigue; stays hungry; and Nasal Congestion  Patient comes in today having significant medical complaints.  She states that she is feeling very badly.  She has a dream amount of fatigue.  She has lost 10 pounds without effort over the past few months.  His usual trauma.  She did have a very large blood workup Fernande-Margaret she is in recent weeks and all of that seem to be normal.  Lungs she does have a past history of skin cancer, melanoma in 1980.  Just she feels a lot of rumbling in her abdomen.  She states that she feels hungry all the time but cannot eat very well.  She tries to eat the best she can.  Past Medical History:  Diagnosis Date  . Cancer ([place]) 1980   melanoma  . Cataract   . Colon polyps   . 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 Hyperlipidemia   . Hypertension   . Osteoporosis    Relevant past medical, surgical, family and social history reviewed and updated as indicated. Interim medical history since our last visit reviewed. Allergies and medications reviewed and updated. DATA REVIEWED: CHART IN EPIC  Family History reviewed for pertinent findings.  Review of Systems  Constitutional: Positive for appetite change, fatigue and unexpected weight change. Negative for activity change and fever.  HENT: Negative.   Eyes: Negative.   Respiratory: Negative.  Negative for cough and shortness of breath.   Cardiovascular: Negative.  Negative for chest pain.  Gastrointestinal: Positive for abdominal pain, constipation and diarrhea.  Endocrine: Negative.   Genitourinary: Negative.  Negative for dysuria.  Musculoskeletal: Negative.   Skin: Negative.   Neurological: Negative.  Negative for dizziness,  seizures, weakness, light-headedness and numbness.    Allergies as of 04/08/2017   No Known Allergies     Medication List        Accurate as of 04/08/17 11:59 PM. Always use your most recent med list.          atorvastatin 20 MG tablet Commonly known as:  LIPITOR Take 1 tablet (20 mg total) by mouth daily.   calcium carbonate 600 MG Tabs tablet Commonly known as:  OS-CAL Take 600 mg by mouth daily. AT LUNCH   CENTRUM SILVER PO Take 1 tablet by mouth every morning.   PRESERVISION AREDS 2 PO Take by mouth 2 (two) times daily.   cholecalciferol 1000 units tablet Commonly known as:  VITAMIN D Take 1,000 Units by mouth daily.   denosumab 60 MG/ML Soln injection Commonly known as:  PROLIA Inject 60 mg into the skin every 6 (six) months. Administer in upper arm, thigh, or abdomen   lisinopril-hydrochlorothiazide 10-12.5 MG tablet Commonly known as:  PRINZIDE,ZESTORETIC Take 1 tablet by mouth daily.          Objective:    BP (!) 151/78   Pulse 82   Temp (!) 97.5 F (36.4 C) (Oral)   Ht _1  (1.676 m)   Wt 123 lb (55.8 kg)   BMI 19.85 kg/m   No Known Allergies  Wt  Readings from Last 3 Encounters:  04/08/17 123 lb (55.8 kg)  02/22/17 128 lb (58.1 kg)  11/11/16 129 lb (58.5 kg)    Physical Exam  Constitutional: She is oriented to person, place, and time. She appears well-developed and well-nourished.  HENT:  Head: Normocephalic and atraumatic.  Right Ear: Tympanic membrane, external ear and ear canal normal.  Left Ear: Tympanic membrane, external ear and ear canal normal.  Nose: Nose normal. No rhinorrhea.  Mouth/Throat: Oropharynx is clear and moist and mucous membranes are normal. No oropharyngeal exudate or posterior oropharyngeal erythema.  Eyes: Pupils are equal, round, and reactive to light. Conjunctivae and EOM are normal.  Neck: Normal range of motion. Neck supple.  Cardiovascular: Normal rate, regular rhythm, normal heart sounds and intact distal  pulses.  Pulmonary/Chest: Effort normal and breath sounds normal.  Abdominal: Soft. Bowel sounds are normal.  Neurological: She is alert and oriented to person, place, and time. She has normal reflexes.  Skin: Skin is warm and dry. No rash noted.  Psychiatric: She has a normal mood and affect. Her behavior is normal. Judgment and thought content normal.    Results for orders placed or performed in visit on 02/22/17  Anemia Profile B  Result Value Ref Range   Total Iron Binding Capacity 278 250 - 450 ug/dL   UIBC 171 118 - 369 ug/dL   Iron 107 27 - 139 ug/dL   Iron Saturation 38 15 - 55 %   Ferritin 127 15 - 150 ng/mL   Vitamin B-12 499 232 - 1,245 pg/mL   Folate >20.0 >3.0 ng/mL   WBC 7.3 3.4 - 10.8 x10E3/uL   RBC 4.22 3.77 - 5.28 x10E6/uL   Hemoglobin 13.9 11.1 - 15.9 g/dL   Hematocrit 41.1 34.0 - 46.6 %   MCV 97 79 - 97 fL   MCH 32.9 26.6 - 33.0 pg   MCHC 33.8 31.5 - 35.7 g/dL   RDW 13.5 12.3 - 15.4 %   Platelets 226 150 - 379 x10E3/uL   Neutrophils 72 Not Estab. %   Lymphs 18 Not Estab. %   Monocytes 10 Not Estab. %   Eos 0 Not Estab. %   Basos 0 Not Estab. %   Neutrophils Absolute 5.2 1.4 - 7.0 x10E3/uL   Lymphocytes Absolute 1.4 0.7 - 3.1 x10E3/uL   Monocytes Absolute 0.7 0.1 - 0.9 x10E3/uL   EOS (ABSOLUTE) 0.0 0.0 - 0.4 x10E3/uL   Basophils Absolute 0.0 0.0 - 0.2 x10E3/uL   Immature Granulocytes 0 Not Estab. %   Immature Grans (Abs) 0.0 0.0 - 0.1 x10E3/uL   Retic Ct Pct 0.8 0.6 - 2.6 %  CMP14+EGFR  Result Value Ref Range   Glucose 98 65 - 99 mg/dL   BUN 21 8 - 27 mg/dL   Creatinine, Ser 1.03 (H) 0.57 - 1.00 mg/dL   GFR calc non Af Amer 51 (L) >59 mL/min/1.73   GFR calc Af Amer 58 (L) >59 mL/min/1.73   BUN/Creatinine Ratio 20 12 - 28   Sodium 142 134 - 144 mmol/L   Potassium 4.5 3.5 - 5.2 mmol/L   Chloride 101 96 - 106 mmol/L   CO2 24 20 - 29 mmol/L   Calcium 9.9 8.7 - 10.3 mg/dL   Total Protein 6.6 6.0 - 8.5 g/dL   Albumin 4.3 3.5 - 4.7 g/dL   Globulin,  Total 2.3 1.5 - 4.5 g/dL   Albumin/Globulin Ratio 1.9 1.2 - 2.2   Bilirubin Total 0.9 0.0 - 1.2 mg/dL  Alkaline Phosphatase 61 39 - 117 IU/L   AST 29 0 - 40 IU/L   ALT 31 0 - 32 IU/L  Thyroid Panel With TSH  Result Value Ref Range   TSH 1.090 0.450 - 4.500 uIU/mL   T4, Total 6.4 4.5 - 12.0 ug/dL   T3 Uptake Ratio 28 24 - 39 %   Free Thyroxine Index 1.8 1.2 - 4.9      Assessment & Plan:   1. Abnormal weight loss - CT Chest W Contrast; Future - CT Abdomen Pelvis Wo Contrast; Future   Continue all other maintenance medications as listed above.  Follow up plan: No follow-ups on file.  Educational handout given for Benson PA-C [facility] [address]  [place], [place] [id number] [phone]   04/10/2017, 5:19 PM

## 2017-04-19 ENCOUNTER — Ambulatory Visit (INDEPENDENT_AMBULATORY_CARE_PROVIDER_SITE_OTHER): Payer: Medicare Other | Admitting: Nurse Practitioner

## 2017-04-19 ENCOUNTER — Encounter: Payer: Self-pay | Admitting: Nurse Practitioner

## 2017-04-19 VITALS — BP 132/71 | HR 76 | Temp 96.8°F | Ht 66.0 in | Wt 123.0 lb

## 2017-04-19 DIAGNOSIS — F5101 Primary insomnia: Secondary | ICD-10-CM

## 2017-04-19 DIAGNOSIS — F411 Generalized anxiety disorder: Secondary | ICD-10-CM

## 2017-04-19 MED ORDER — CITALOPRAM HYDROBROMIDE 20 MG PO TABS: 20.0000 mg | ORAL_TABLET | Freq: Every day | ORAL | 5 refills | Status: DC

## 2017-04-19 NOTE — Patient Instructions (Signed)
Stress and Stress Management Stress is a normal reaction to life events. It is what you feel when life demands more than you are used to or more than you can handle. Some stress can be useful. For example, the stress reaction can help you catch the last bus of the day, study for a test, or meet a deadline at work. But stress that occurs too often or for too long can cause problems. It can affect your emotional health and interfere with relationships and normal daily activities. Too much stress can weaken your immune system and increase your risk for physical illness. If you already have a medical problem, stress can make it worse. What are the causes? All sorts of life events may cause stress. An event that causes stress for one person may not be stressful for another person. Major life events commonly cause stress. These may be positive or negative. Examples include losing your job, moving into a new home, getting married, having a baby, or losing a loved one. Less obvious life events may also cause stress, especially if they occur day after day or in combination. Examples include working long hours, driving in traffic, caring for children, being in debt, or being in a difficult relationship. What are the signs or symptoms? Stress may cause emotional symptoms including, the following:  Anxiety. This is feeling worried, afraid, on edge, overwhelmed, or out of control.  Anger. This is feeling irritated or impatient.  Depression. This is feeling sad, down, helpless, or guilty.  Difficulty focusing, remembering, or making decisions.  Stress may cause physical symptoms, including the following:  Aches and pains. These may affect your head, neck, back, stomach, or other areas of your body.  Tight muscles or clenched jaw.  Low energy or trouble sleeping.  Stress may cause unhealthy behaviors, including the following:  Eating to feel better (overeating) or skipping meals.  Sleeping too little,  too much, or both.  Working too much or putting off tasks (procrastination).  Smoking, drinking alcohol, or using drugs to feel better.  How is this diagnosed? Stress is diagnosed through an assessment by your health care provider. Your health care provider will ask questions about your symptoms and any stressful life events.Your health care provider will also ask about your medical history and may order blood tests or other tests. Certain medical conditions and medicine can cause physical symptoms similar to stress. Mental illness can cause emotional symptoms and unhealthy behaviors similar to stress. Your health care provider may refer you to a mental health professional for further evaluation. How is this treated? Stress management is the recommended treatment for stress.The goals of stress management are reducing stressful life events and coping with stress in healthy ways. Techniques for reducing stressful life events include the following:  Stress identification. Self-monitor for stress and identify what causes stress for you. These skills may help you to avoid some stressful events.  Time management. Set your priorities, keep a calendar of events, and learn to say "no." These tools can help you avoid making too many commitments.  Techniques for coping with stress include the following:  Rethinking the problem. Try to think realistically about stressful events rather than ignoring them or overreacting. Try to find the positives in a stressful situation rather than focusing on the negatives.  Exercise. Physical exercise can release both physical and emotional tension. The key is to find a form of exercise you enjoy and do it regularly.  Relaxation techniques. These relax the body and  mind. Examples include yoga, meditation, tai chi, biofeedback, deep breathing, progressive muscle relaxation, listening to music, being out in nature, journaling, and other hobbies. Again, the key is to find  one or more that you enjoy and can do regularly.  Healthy lifestyle. Eat a balanced diet, get plenty of sleep, and do not smoke. Avoid using alcohol or drugs to relax.  Strong support network. Spend time with family, friends, or other people you enjoy being around.Express your feelings and talk things over with someone you trust.  Counseling or talktherapy with a mental health professional may be helpful if you are having difficulty managing stress on your own. Medicine is typically not recommended for the treatment of stress.Talk to your health care provider if you think you need medicine for symptoms of stress. Follow these instructions at home:  Keep all follow-up visits as directed by your health care provider.  Take all medicines as directed by your health care provider. Contact a health care provider if:  Your symptoms get worse or you start having new symptoms.  You feel overwhelmed by your problems and can no longer manage them on your own. Get help right away if:  You feel like hurting yourself or someone else. This information is not intended to replace advice given to you by your health care provider. Make sure you discuss any questions you have with your health care provider. Document Released: 06/30/2000 Document Revised: 06/12/2015 Document Reviewed: 08/29/2012 Elsevier Interactive Patient Education  2017 [employer]vier Inc[employer]  

## 2017-04-19 NOTE — Progress Notes (Signed)
   Subjective:    Patient ID: Laura Acevedo, female    DOB: Jan 28, 1932, 84 y.o.   MRN: [id number]  HPI Patient come sin today c/o being a "nervous reck"! Says she doesn't have eat and cannot sleep. She says she sits around and worries all the time. She saw ARonnald Ramp on 04/08/17 and was complaining of feeling bad and weight loss and she told her she was going to send her for some tests at [facility]. CT of abdomen scheduled for 05/02/17. She said this all started around January 27, 2017 when she had a cancer lesion removed from right temporal area and then her husband fell and she has been anxious every since. She has not tried to take any OTC meds.  GAD 7 : Generalized Anxiety Score 04/19/2017  Nervous, Anxious, on Edge 3  Control/stop worrying 3  Worry too much - different things 3  Trouble relaxing 3  Restless 2  Easily annoyed or irritable 1  Afraid - awful might happen 3  Total GAD 7 Score 18  Anxiety Difficulty Very difficult          Review of Systems  Respiratory: Negative.   Cardiovascular: Negative.   Gastrointestinal: Negative.   Neurological: Negative.   Psychiatric/Behavioral: Positive for sleep disturbance. Negative for suicidal ideas. The patient is nervous/anxious.   All other systems reviewed and are negative.      Objective:   Physical Exam  Constitutional: She is oriented to person, place, and time. She appears well-developed and well-nourished. No distress.  Cardiovascular: Normal rate and regular rhythm.  Pulmonary/Chest: Effort normal and breath sounds normal.  Neurological: She is alert and oriented to person, place, and time.  Skin: Skin is warm.  Psychiatric: She has a normal mood and affect. Her behavior is normal. Judgment and thought content normal.  Good eye contact Answers all questions appropriately   BP 132/71   Pulse 76   Temp (!) 96.8 F (36 C) (Oral)   Ht 5\' 6"  (1.676 m)   Wt 123 lb (55.8 kg)   BMI 19.85 kg/m       Assessment & Plan:    1. GAD (generalized anxiety disorder) stress management discussed follo wup in 3 weeks - citalopram (CELEXA) 20 MG tablet; Take 1 tablet (20 mg total) by mouth daily.  Dispense: 30 tablet; Refill: 5  2. Primary insomnia Melatonin OTC about 1 hour prior to bedtime  Laycee-Margaret Hassell Done, FNP

## 2017-05-02 ENCOUNTER — Ambulatory Visit (HOSPITAL_COMMUNITY)
Admission: RE | Admit: 2017-05-02 | Discharge: 2017-05-02 | Disposition: A | Discharge status: Home / Self Care | Payer: Medicare Other | Source: Ambulatory Visit | Attending: Physician Assistant | Admitting: Physician Assistant

## 2017-05-02 DIAGNOSIS — E042 Nontoxic multinodular goiter: Secondary | ICD-10-CM | POA: Diagnosis not present

## 2017-05-02 DIAGNOSIS — I2510 Atherosclerotic heart disease of native coronary artery without angina pectoris: Secondary | ICD-10-CM | POA: Diagnosis not present

## 2017-05-02 DIAGNOSIS — K5730 Diverticulosis of large intestine without perforation or abscess without bleeding: Secondary | ICD-10-CM | POA: Insufficient documentation

## 2017-05-02 DIAGNOSIS — D259 Leiomyoma of uterus, unspecified: Secondary | ICD-10-CM | POA: Insufficient documentation

## 2017-05-02 DIAGNOSIS — J449 Chronic obstructive pulmonary disease, unspecified: Secondary | ICD-10-CM | POA: Diagnosis not present

## 2017-05-02 DIAGNOSIS — I700 Atherosclerosis of aorta: Secondary | ICD-10-CM | POA: Diagnosis not present

## 2017-05-02 DIAGNOSIS — R634 Abnormal weight loss: Secondary | ICD-10-CM | POA: Diagnosis present

## 2017-05-02 DIAGNOSIS — Z681 Body mass index (BMI) 19 or less, adult: Secondary | ICD-10-CM | POA: Insufficient documentation

## 2017-05-02 LAB — POCT I-STAT CREATININE: CREATININE: 1.1 mg/dL — AB (ref 0.44–1.00)

## 2017-05-02 MED ORDER — IOPAMIDOL (ISOVUE-300) INJECTION 61%
100.0000 mL | Freq: Once | INTRAVENOUS | Status: AC | PRN
  Administered 2017-05-02: 100 mL via INTRAVENOUS

## 2017-05-03 ENCOUNTER — Telehealth: Payer: Self-pay | Admitting: Nurse Practitioner

## 2017-05-03 ENCOUNTER — Other Ambulatory Visit: Payer: Self-pay | Admitting: *Deleted

## 2017-05-03 DIAGNOSIS — E041 Nontoxic single thyroid nodule: Secondary | ICD-10-CM

## 2017-05-04 NOTE — Telephone Encounter (Signed)
Pt wanted some clarification about CT Chest. Explained results and told her that we are sending her copies of reports in the mail. Also verified her Thyroid Ultrasound appointment

## 2017-05-04 NOTE — Telephone Encounter (Signed)
Called and spoke with patient. She wanted CT scans not MRI. Mailed copy to patient. She was confused when the nurse called her with the results. She would like for another nurse to call her back and go over results slowly for her so she can understand what they found on the CT. Please advise.

## 2017-05-05 ENCOUNTER — Telehealth: Payer: Self-pay | Admitting: Nurse Practitioner

## 2017-05-10 ENCOUNTER — Ambulatory Visit (HOSPITAL_COMMUNITY)
Admission: RE | Admit: 2017-05-10 | Discharge: 2017-05-10 | Disposition: A | Discharge status: Home / Self Care | Payer: Medicare Other | Source: Ambulatory Visit | Attending: Nurse Practitioner | Admitting: Nurse Practitioner

## 2017-05-10 ENCOUNTER — Other Ambulatory Visit: Payer: Self-pay | Admitting: Nurse Practitioner

## 2017-05-10 DIAGNOSIS — E042 Nontoxic multinodular goiter: Secondary | ICD-10-CM

## 2017-05-10 DIAGNOSIS — E041 Nontoxic single thyroid nodule: Secondary | ICD-10-CM | POA: Diagnosis present

## 2017-05-12 ENCOUNTER — Ambulatory Visit (INDEPENDENT_AMBULATORY_CARE_PROVIDER_SITE_OTHER): Payer: Medicare Other | Admitting: Nurse Practitioner

## 2017-05-12 ENCOUNTER — Encounter: Payer: Self-pay | Admitting: Nurse Practitioner

## 2017-05-12 VITALS — BP 126/69 | HR 73 | Temp 97.2°F | Ht 66.0 in | Wt 121.0 lb

## 2017-05-12 DIAGNOSIS — E782 Mixed hyperlipidemia: Secondary | ICD-10-CM | POA: Diagnosis not present

## 2017-05-12 DIAGNOSIS — M810 Age-related osteoporosis without current pathological fracture: Secondary | ICD-10-CM | POA: Diagnosis not present

## 2017-05-12 DIAGNOSIS — F411 Generalized anxiety disorder: Secondary | ICD-10-CM

## 2017-05-12 DIAGNOSIS — I10 Essential (primary) hypertension: Secondary | ICD-10-CM | POA: Diagnosis not present

## 2017-05-12 LAB — CMP14+EGFR
A/G RATIO: 2 (ref 1.2–2.2)
ALT: 31 IU/L (ref 0–32)
AST: 28 IU/L (ref 0–40)
Albumin: 4.3 g/dL (ref 3.5–4.7)
Alkaline Phosphatase: 50 IU/L (ref 39–117)
BILIRUBIN TOTAL: 0.9 mg/dL (ref 0.0–1.2)
BUN/Creatinine Ratio: 23 (ref 12–28)
BUN: 26 mg/dL (ref 8–27)
CHLORIDE: 99 mmol/L (ref 96–106)
CO2: 27 mmol/L (ref 20–29)
Calcium: 10 mg/dL (ref 8.7–10.3)
Creatinine, Ser: 1.15 mg/dL — ABNORMAL HIGH (ref 0.57–1.00)
GFR calc non Af Amer: 44 mL/min/{1.73_m2} — ABNORMAL LOW (ref >59)
GFR, EST AFRICAN AMERICAN: 51 mL/min/{1.73_m2} — AB (ref >59)
Globulin, Total: 2.1 g/dL (ref 1.5–4.5)
Glucose: 133 mg/dL — ABNORMAL HIGH (ref 65–99)
POTASSIUM: 4.3 mmol/L (ref 3.5–5.2)
Sodium: 140 mmol/L (ref 134–144)
TOTAL PROTEIN: 6.4 g/dL (ref 6.0–8.5)

## 2017-05-12 LAB — LIPID PANEL
Chol/HDL Ratio: 2 ratio (ref 0.0–4.4)
Cholesterol, Total: 191 mg/dL (ref 100–199)
HDL: 95 mg/dL (ref >39)
LDL Calculated: 81 mg/dL (ref 0–99)
Triglycerides: 76 mg/dL (ref 0–149)
VLDL Cholesterol Cal: 15 mg/dL (ref 5–40)

## 2017-05-12 MED ORDER — LISINOPRIL-HYDROCHLOROTHIAZIDE 10-12.5 MG PO TABS: 1.0000 | ORAL_TABLET | Freq: Every day | ORAL | 1 refills | Status: DC

## 2017-05-12 MED ORDER — ATORVASTATIN CALCIUM 20 MG PO TABS: 20.0000 mg | ORAL_TABLET | Freq: Every day | ORAL | 1 refills | Status: DC

## 2017-05-12 NOTE — Progress Notes (Signed)
 Subjective:    Patient ID: Laura Acevedo, female    DOB: 06/15/1932, 84 y.o.   MRN: [id number]  HPI  Laura Acevedo is here today for follow up of chronic medical problem.  Outpatient Encounter Medications as of 05/12/2017  Medication Sig  . atorvastatin (LIPITOR) 20 MG tablet Take 1 tablet (20 mg total) by mouth daily.  . calcium carbonate (OS-CAL) 600 MG TABS Take 600 mg by mouth daily. AT LUNCH  . cholecalciferol (VITAMIN D) 1000 UNITS tablet Take 1,000 Units by mouth daily.   . citalopram (CELEXA) 20 MG tablet Take 1 tablet (20 mg total) by mouth daily.  . denosumab (PROLIA) 60 MG/ML SOLN injection Inject 60 mg into the skin every 6 (six) months. Administer in upper arm, thigh, or abdomen  . lisinopril-hydrochlorothiazide (PRINZIDE,ZESTORETIC) 10-12.5 MG tablet Take 1 tablet by mouth daily.  . Multiple Vitamins-Minerals (CENTRUM SILVER PO) Take 1 tablet by mouth every morning.  . Multiple Vitamins-Minerals (PRESERVISION AREDS 2 PO) Take by mouth 2 (two) times daily.     1. Essential hypertension, benign  No c/o chest pain, sob or headache. Does not check blood pressures at home. BP Readings from Last 3 Encounters:  04/19/17 132/71  04/08/17 (!) 151/78  02/22/17 135/72     2. Age-related osteoporosis without current pathological fracture  Does no dedicated weight bearing exercises. Does not want to do anymore dexascans. She is currently on prolia.  3. Mixed hyperlipidemia  Does not really watch diet    New complaints: She still gets nervous and jittery. She was given celexa for anxiety. She nevrer started taking because she read the side effects.   Social history:  lives with husband  * patient is currently being scheduled for fine needle bx of thyroid nodules. Had U/S last week which showed nodules on both sides.  Review of Systems  Constitutional: Negative for activity change and appetite change.  HENT: Negative.   Eyes: Negative for pain.  Respiratory: Negative for  shortness of breath.   Cardiovascular: Negative for chest pain, palpitations and leg swelling.  Gastrointestinal: Negative for abdominal pain.  Endocrine: Negative for polydipsia.  Genitourinary: Negative.   Skin: Negative for rash.  Neurological: Negative for dizziness, weakness and headaches.  Hematological: Does not bruise/bleed easily.  Psychiatric/Behavioral: Negative.   All other systems reviewed and are negative.      Objective:   Physical Exam  Constitutional: She is oriented to person, place, and time. She appears well-developed and well-nourished.  HENT:  Nose: Nose normal.  Mouth/Throat: Oropharynx is clear and moist.  Eyes: EOM are normal.  Neck: Trachea normal, normal range of motion and full passive range of motion without pain. Neck supple. No JVD present. Carotid bruit is not present. No thyromegaly present.  Cardiovascular: Normal rate, regular rhythm, normal heart sounds and intact distal pulses. Exam reveals no gallop and no friction rub.  No murmur heard. Pulmonary/Chest: Effort normal and breath sounds normal.  Abdominal: Soft. Bowel sounds are normal. She exhibits no distension and no mass. There is no tenderness.  Musculoskeletal: Normal range of motion.  Lymphadenopathy:    She has no cervical adenopathy.  Neurological: She is alert and oriented to person, place, and time. She has normal reflexes.  Skin: Skin is warm and dry.  Psychiatric: She has a normal mood and affect. Her behavior is normal. Judgment and thought content normal.   BP 126/69   Pulse 73   Temp (!) 97.2 F (36.2 C) (Oral)     Ht 5' 6" (1.676 m)   Wt 121 lb (54.9 kg)   BMI 19.53 kg/m       Assessment & Plan:  1. Essential hypertension, benign Low sdoium diet - lisinopril-hydrochlorothiazide (PRINZIDE,ZESTORETIC) 10-12.5 MG tablet; Take 1 tablet by mouth daily.  Dispense: 90 tablet; Refill: 1 - CMP14+EGFR  2. Age-related osteoporosis without current pathological fracture Weight  bearing exercises encouraged  3. Mixed hyperlipidemia Low fat diet - atorvastatin (LIPITOR) 20 MG tablet; Take 1 tablet (20 mg total) by mouth daily.  Dispense: 90 tablet; Refill: 1 - Lipid panel  4. GAD (generalized anxiety disorder) Stress management Encouraged to start celexa and let me know if there are any sideeffects    Labs pending Health maintenance reviewed Diet and exercise encouraged Continue all meds Follow up  In 3 months   Okla-Margaret Martin, FNP   

## 2017-05-12 NOTE — Patient Instructions (Signed)
Stress and Stress Management Stress is a normal reaction to life events. It is what you feel when life demands more than you are used to or more than you can handle. Some stress can be useful. For example, the stress reaction can help you catch the last bus of the day, study for a test, or meet a deadline at work. But stress that occurs too often or for too long can cause problems. It can affect your emotional health and interfere with relationships and normal daily activities. Too much stress can weaken your immune system and increase your risk for physical illness. If you already have a medical problem, stress can make it worse. What are the causes? All sorts of life events may cause stress. An event that causes stress for one person may not be stressful for another person. Major life events commonly cause stress. These may be positive or negative. Examples include losing your job, moving into a new home, getting married, having a baby, or losing a loved one. Less obvious life events may also cause stress, especially if they occur day after day or in combination. Examples include working long hours, driving in traffic, caring for children, being in debt, or being in a difficult relationship. What are the signs or symptoms? Stress may cause emotional symptoms including, the following:  Anxiety. This is feeling worried, afraid, on edge, overwhelmed, or out of control.  Anger. This is feeling irritated or impatient.  Depression. This is feeling sad, down, helpless, or guilty.  Difficulty focusing, remembering, or making decisions.  Stress may cause physical symptoms, including the following:  Aches and pains. These may affect your head, neck, back, stomach, or other areas of your body.  Tight muscles or clenched jaw.  Low energy or trouble sleeping.  Stress may cause unhealthy behaviors, including the following:  Eating to feel better (overeating) or skipping meals.  Sleeping too little,  too much, or both.  Working too much or putting off tasks (procrastination).  Smoking, drinking alcohol, or using drugs to feel better.  How is this diagnosed? Stress is diagnosed through an assessment by your health care provider. Your health care provider will ask questions about your symptoms and any stressful life events.Your health care provider will also ask about your medical history and may order blood tests or other tests. Certain medical conditions and medicine can cause physical symptoms similar to stress. Mental illness can cause emotional symptoms and unhealthy behaviors similar to stress. Your health care provider may refer you to a mental health professional for further evaluation. How is this treated? Stress management is the recommended treatment for stress.The goals of stress management are reducing stressful life events and coping with stress in healthy ways. Techniques for reducing stressful life events include the following:  Stress identification. Self-monitor for stress and identify what causes stress for you. These skills may help you to avoid some stressful events.  Time management. Set your priorities, keep a calendar of events, and learn to say "no." These tools can help you avoid making too many commitments.  Techniques for coping with stress include the following:  Rethinking the problem. Try to think realistically about stressful events rather than ignoring them or overreacting. Try to find the positives in a stressful situation rather than focusing on the negatives.  Exercise. Physical exercise can release both physical and emotional tension. The key is to find a form of exercise you enjoy and do it regularly.  Relaxation techniques. These relax the body and  mind. Examples include yoga, meditation, tai chi, biofeedback, deep breathing, progressive muscle relaxation, listening to music, being out in nature, journaling, and other hobbies. Again, the key is to find  one or more that you enjoy and can do regularly.  Healthy lifestyle. Eat a balanced diet, get plenty of sleep, and do not smoke. Avoid using alcohol or drugs to relax.  Strong support network. Spend time with family, friends, or other people you enjoy being around.Express your feelings and talk things over with someone you trust.  Counseling or talktherapy with a mental health professional may be helpful if you are having difficulty managing stress on your own. Medicine is typically not recommended for the treatment of stress.Talk to your health care provider if you think you need medicine for symptoms of stress. Follow these instructions at home:  Keep all follow-up visits as directed by your health care provider.  Take all medicines as directed by your health care provider. Contact a health care provider if:  Your symptoms get worse or you start having new symptoms.  You feel overwhelmed by your problems and can no longer manage them on your own. Get help right away if:  You feel like hurting yourself or someone else. This information is not intended to replace advice given to you by your health care provider. Make sure you discuss any questions you have with your health care provider. Document Released: 06/30/2000 Document Revised: 06/12/2015 Document Reviewed: 08/29/2012 Elsevier Interactive Patient Education  2017 [employer]vier Inc[employer]  

## 2017-05-16 ENCOUNTER — Other Ambulatory Visit: Payer: Self-pay | Admitting: Nurse Practitioner

## 2017-05-16 DIAGNOSIS — E042 Nontoxic multinodular goiter: Secondary | ICD-10-CM

## 2017-05-18 ENCOUNTER — Ambulatory Visit (HOSPITAL_COMMUNITY)
Admission: RE | Admit: 2017-05-18 | Discharge: 2017-05-18 | Disposition: A | Discharge status: Home / Self Care | Payer: Medicare Other | Source: Ambulatory Visit | Attending: Nurse Practitioner | Admitting: Nurse Practitioner

## 2017-05-18 ENCOUNTER — Encounter (HOSPITAL_COMMUNITY): Payer: Self-pay

## 2017-05-18 DIAGNOSIS — E041 Nontoxic single thyroid nodule: Secondary | ICD-10-CM | POA: Diagnosis not present

## 2017-05-18 DIAGNOSIS — E042 Nontoxic multinodular goiter: Secondary | ICD-10-CM

## 2017-05-18 MED ORDER — LIDOCAINE HCL (PF) 2 % IJ SOLN
INTRAMUSCULAR | Status: AC
  Administered 2017-05-18: 10 mL
  Filled 2017-05-18: qty 20

## 2017-05-19 DIAGNOSIS — E041 Nontoxic single thyroid nodule: Secondary | ICD-10-CM | POA: Diagnosis not present

## 2017-05-24 ENCOUNTER — Telehealth: Payer: Self-pay | Admitting: Nurse Practitioner

## 2017-05-26 NOTE — Telephone Encounter (Signed)
Pt aware.

## 2017-05-26 NOTE — Telephone Encounter (Signed)
Not cancerous

## 2017-06-06 ENCOUNTER — Ambulatory Visit (INDEPENDENT_AMBULATORY_CARE_PROVIDER_SITE_OTHER): Payer: Medicare Other | Admitting: *Deleted

## 2017-06-06 DIAGNOSIS — M810 Age-related osteoporosis without current pathological fracture: Secondary | ICD-10-CM

## 2017-06-06 MED ORDER — DENOSUMAB 60 MG/ML ~~LOC~~ SOSY
60.0000 mg | PREFILLED_SYRINGE | Freq: Once | SUBCUTANEOUS | Status: AC
  Administered 2017-06-06: 60 mg via SUBCUTANEOUS

## 2017-06-06 NOTE — Progress Notes (Signed)
Pt given Prolia inj Last Dexa 09/27/2016 Buy and bill Next inj due 12/08/2017

## 2017-06-07 DIAGNOSIS — M2042 Other hammer toe(s) (acquired), left foot: Secondary | ICD-10-CM | POA: Diagnosis not present

## 2017-06-07 DIAGNOSIS — M2041 Other hammer toe(s) (acquired), right foot: Secondary | ICD-10-CM | POA: Diagnosis not present

## 2017-06-07 DIAGNOSIS — M79676 Pain in unspecified toe(s): Secondary | ICD-10-CM | POA: Diagnosis not present

## 2017-07-14 DIAGNOSIS — H35371 Puckering of macula, right eye: Secondary | ICD-10-CM | POA: Diagnosis not present

## 2017-07-14 DIAGNOSIS — H524 Presbyopia: Secondary | ICD-10-CM | POA: Diagnosis not present

## 2017-07-14 DIAGNOSIS — H353131 Nonexudative age-related macular degeneration, bilateral, early dry stage: Secondary | ICD-10-CM | POA: Diagnosis not present

## 2017-08-11 ENCOUNTER — Ambulatory Visit (INDEPENDENT_AMBULATORY_CARE_PROVIDER_SITE_OTHER): Payer: Medicare Other | Admitting: Nurse Practitioner

## 2017-08-11 ENCOUNTER — Encounter: Payer: Self-pay | Admitting: Nurse Practitioner

## 2017-08-11 VITALS — BP 135/70 | HR 63 | Temp 96.9°F | Ht 66.0 in | Wt 127.0 lb

## 2017-08-11 DIAGNOSIS — F411 Generalized anxiety disorder: Secondary | ICD-10-CM

## 2017-08-11 DIAGNOSIS — M810 Age-related osteoporosis without current pathological fracture: Secondary | ICD-10-CM

## 2017-08-11 DIAGNOSIS — E782 Mixed hyperlipidemia: Secondary | ICD-10-CM | POA: Diagnosis not present

## 2017-08-11 DIAGNOSIS — I10 Essential (primary) hypertension: Secondary | ICD-10-CM

## 2017-08-11 MED ORDER — LISINOPRIL-HYDROCHLOROTHIAZIDE 10-12.5 MG PO TABS: 1.0000 | ORAL_TABLET | Freq: Every day | ORAL | 1 refills | Status: DC

## 2017-08-11 MED ORDER — ATORVASTATIN CALCIUM 20 MG PO TABS: 20.0000 mg | ORAL_TABLET | Freq: Every day | ORAL | 1 refills | Status: DC

## 2017-08-11 NOTE — Patient Instructions (Signed)

## 2017-08-11 NOTE — Progress Notes (Signed)
Subjective:    Patient ID: Laura Acevedo, female    DOB: December 10, 1932, 84 y.o.   MRN: [id number]   Chief Complaint: Medical Management of Chronic Issues   HPI:  1. Essential hypertension, benign  No c/o chest pain, sob or headache. Does not check blood pressure at home. BP Readings from Last 3 Encounters:  08/11/17 135/70  05/18/17 (!) 143/74  05/12/17 126/69     2. Age-related osteoporosis without current pathological fracture  Last bmd test with tscore of -2.5. Is on prolia.. No c/o back pain.  3. Mixed hyperlipidemia  Does not really watch diet. Does very little exercise but does stay busy  4. GAD (generalized anxiety disorder)  Is anxious at times but os on m=no meds. Says she just tries to stay busy when she starts worrying.    Outpatient Encounter Medications as of 08/11/2017  Medication Sig  . atorvastatin (LIPITOR) 20 MG tablet Take 1 tablet (20 mg total) by mouth daily.  . calcium carbonate (OS-CAL) 600 MG TABS Take 600 mg by mouth daily. AT LUNCH  . cholecalciferol (VITAMIN D) 1000 UNITS tablet Take 1,000 Units by mouth daily.   Marland Kitchen denosumab (PROLIA) 60 MG/ML SOLN injection Inject 60 mg into the skin every 6 (six) months. Administer in upper arm, thigh, or abdomen  . lisinopril-hydrochlorothiazide (PRINZIDE,ZESTORETIC) 10-12.5 MG tablet Take 1 tablet by mouth daily.  . Multiple Vitamins-Minerals (CENTRUM SILVER PO) Take 1 tablet by mouth every morning.  . Multiple Vitamins-Minerals (PRESERVISION AREDS 2 PO) Take by mouth 2 (two) times daily.      New complaints: None today  Social history: Lives with husband- he has glaucoma and has lost a lot of his vision and can no longer drive. Has brothers and sisters that live near by and help when needed.  Review of Systems  Constitutional: Negative for activity change and appetite change.  HENT: Negative.   Eyes: Negative for pain.  Respiratory: Negative for shortness of breath.   Cardiovascular: Negative for chest  pain, palpitations and leg swelling.  Gastrointestinal: Negative for abdominal pain.  Endocrine: Negative for polydipsia.  Genitourinary: Negative.   Musculoskeletal: Negative.   Skin: Negative for rash.  Neurological: Negative for dizziness, weakness and headaches.  Hematological: Does not bruise/bleed easily.  Psychiatric/Behavioral: Negative.   All other systems reviewed and are negative.      Objective:   Physical Exam  Constitutional: She is oriented to person, place, and time. She appears well-developed and well-nourished. No distress.  HENT:  Head: Normocephalic.  Nose: Nose normal.  Mouth/Throat: Oropharynx is clear and moist.  Eyes: Pupils are equal, round, and reactive to light. EOM are normal.  Neck: Normal range of motion. Neck supple. No JVD present. Carotid bruit is not present.  Cardiovascular: Normal rate, regular rhythm, normal heart sounds and intact distal pulses.  Pulmonary/Chest: Effort normal and breath sounds normal. No respiratory distress. She has no wheezes. She has no rales. She exhibits no tenderness.  Abdominal: Soft. Normal appearance, normal aorta and bowel sounds are normal. She exhibits no distension, no abdominal bruit, no pulsatile midline mass and no mass. There is no splenomegaly or hepatomegaly. There is no tenderness.  Musculoskeletal: Normal range of motion. She exhibits no edema.  Lymphadenopathy:    She has no cervical adenopathy.  Neurological: She is alert and oriented to person, place, and time. She has normal reflexes.  Skin: Skin is warm and dry.  Psychiatric: She has a normal mood and affect. Her behavior is  normal. Judgment and thought content normal.  Nursing note and vitals reviewed.   BP 135/70   Pulse 63   Temp (!) 96.9 F (36.1 C) (Oral)   Ht 5' 6"  (1.676 m)   Wt 127 lb (57.6 kg)   BMI 20.50 kg/m        Assessment & Plan:  Laura Acevedo comes in today with chief complaint of Medical Management of Chronic  Issues   Diagnosis and orders addressed:  1. Essential hypertension, benign Low sodium diet - lisinopril-hydrochlorothiazide (PRINZIDE,ZESTORETIC) 10-12.5 MG tablet; Take 1 tablet by mouth daily.  Dispense: 90 tablet; Refill: 1 - CMP14+EGFR  2. Age-related osteoporosis without current pathological fracture Weight bearing exercises encouraged  3. Mixed hyperlipidemia Low fat diet - atorvastatin (LIPITOR) 20 MG tablet; Take 1 tablet (20 mg total) by mouth daily.  Dispense: 90 tablet; Refill: 1 - Lipid panel  4. GAD (generalized anxiety disorder) Stress management   Labs pending Health Maintenance reviewed Diet and exercise encouraged  Follow up plan: 3 months   Albertina-Margaret Hassell Done, FNP

## 2017-08-12 LAB — CMP14+EGFR
ALBUMIN: 4.5 g/dL (ref 3.5–4.7)
ALK PHOS: 52 IU/L (ref 39–117)
ALT: 31 IU/L (ref 0–32)
AST: 34 IU/L (ref 0–40)
Albumin/Globulin Ratio: 2.1 (ref 1.2–2.2)
BUN / CREAT RATIO: 21 (ref 12–28)
BUN: 20 mg/dL (ref 8–27)
Bilirubin Total: 1.1 mg/dL (ref 0.0–1.2)
CO2: 25 mmol/L (ref 20–29)
CREATININE: 0.96 mg/dL (ref 0.57–1.00)
Calcium: 9.5 mg/dL (ref 8.7–10.3)
Chloride: 100 mmol/L (ref 96–106)
GFR calc Af Amer: 63 mL/min/{1.73_m2} (ref >59)
GFR calc non Af Amer: 55 mL/min/{1.73_m2} — ABNORMAL LOW (ref >59)
GLUCOSE: 92 mg/dL (ref 65–99)
Globulin, Total: 2.1 g/dL (ref 1.5–4.5)
Potassium: 4.2 mmol/L (ref 3.5–5.2)
Sodium: 142 mmol/L (ref 134–144)
Total Protein: 6.6 g/dL (ref 6.0–8.5)

## 2017-08-12 LAB — LIPID PANEL
CHOLESTEROL TOTAL: 157 mg/dL (ref 100–199)
Chol/HDL Ratio: 1.6 ratio (ref 0.0–4.4)
HDL: 96 mg/dL (ref >39)
LDL Calculated: 48 mg/dL (ref 0–99)
TRIGLYCERIDES: 64 mg/dL (ref 0–149)
VLDL CHOLESTEROL CAL: 13 mg/dL (ref 5–40)

## 2017-09-15 DIAGNOSIS — Z1231 Encounter for screening mammogram for malignant neoplasm of breast: Secondary | ICD-10-CM | POA: Diagnosis not present

## 2017-09-15 LAB — HM MAMMOGRAPHY

## 2017-09-29 ENCOUNTER — Encounter: Payer: Self-pay | Admitting: *Deleted

## 2017-09-29 ENCOUNTER — Ambulatory Visit (INDEPENDENT_AMBULATORY_CARE_PROVIDER_SITE_OTHER): Payer: Medicare Other | Admitting: *Deleted

## 2017-09-29 VITALS — BP 142/71 | HR 69 | Ht 65.0 in | Wt 131.0 lb

## 2017-09-29 DIAGNOSIS — Z0000 Encounter for general adult medical examination without abnormal findings: Secondary | ICD-10-CM | POA: Diagnosis not present

## 2017-09-29 NOTE — Patient Instructions (Signed)
  Laura Acevedo , Thank you for taking time to come for your Medicare Wellness Visit. I appreciate your ongoing commitment to your health goals. Please review the following plan we discussed and let me know if I can assist you in the future.   These are the goals we discussed: Goals    . Exercise 3x per week (30 min per time)       This is a list of the screening recommended for you and due dates:  Health Maintenance  Topic Date Due  . Flu Shot  08/18/2017  . Tetanus Vaccine  08/12/2018*  . Mammogram  09/16/2018  . DEXA scan (bone density measurement)  09/28/2018  . Pneumonia vaccines  Completed  *Topic was postponed. The date shown is not the original due date.   Check with your pharmacy or at your next visit to see how much a Tdap (tetanus shot will be)

## 2017-09-29 NOTE — Progress Notes (Addendum)
Subjective:   Laura Acevedo is a 84 y.o. female who presents for a Medicare Annual Wellness Visit. Laura Acevedo lives at home with her husband of 24 years. She is very active around her home and has a garden that she tends. She also cans and freezes things from that garden.     Review of Systems    Patient reports that her overall health is unchanged compared to last year.  Cardiac Risk Factors include: advanced age (>37men, >32 women);dyslipidemia;hypertension   All other systems negative       Current Medications (verified) Outpatient Encounter Medications as of 09/29/2017  Medication Sig  . atorvastatin (LIPITOR) 20 MG tablet Take 1 tablet (20 mg total) by mouth daily.  . calcium carbonate (OS-CAL) 600 MG TABS Take 600 mg by mouth daily. AT LUNCH  . cholecalciferol (VITAMIN D) 1000 UNITS tablet Take 1,000 Units by mouth daily.   Marland Kitchen denosumab (PROLIA) 60 MG/ML SOLN injection Inject 60 mg into the skin every 6 (six) months. Administer in upper arm, thigh, or abdomen  . lisinopril-hydrochlorothiazide (PRINZIDE,ZESTORETIC) 10-12.5 MG tablet Take 1 tablet by mouth daily.  . Multiple Vitamins-Minerals (CENTRUM SILVER PO) Take 1 tablet by mouth every morning.  . Multiple Vitamins-Minerals (PRESERVISION AREDS 2 PO) Take by mouth 2 (two) times daily.   No facility-administered encounter medications on file as of 09/29/2017.     Allergies (verified) Patient has no known allergies.   History: Past Medical History:  Diagnosis Date  . Cancer ([facility]) 1980   melanoma  . Cataract   . Colon polyps   . 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 Hyperlipidemia   . Hypertension   . Osteoporosis    Past Surgical History:  Procedure Laterality Date  . CATARACT EXTRACTION    . FOOT NEUROMA SURGERY Left 2005   Dr Irving Shows  . MELANOMA EXCISION Left    leg   Family History  Problem Relation Age of Onset  . Diabetes Mother   . Heart disease Mother   . Heart disease Father   . Heart attack Father   . Kidney  disease Father   . Hypertension Sister   . Diabetes Brother   . Hypertension Brother   . Cancer Brother        bladder   . Diabetes Brother   . Diabetes Brother   . Colon cancer Neg Hx    Social History   Socioeconomic History  . Marital status: Married    Spouse name: Not on file  . Number of children: 0  . Years of education: 28  . Highest education level: 12th grade  Occupational History  . Occupation: retired    Comment: Copy  . Financial resource strain: Not hard at all  . Food insecurity:    Worry: Never true    Inability: Never true  . Transportation needs:    Medical: No    Non-medical: No  Tobacco Use  . Smoking status: Never Smoker  . Smokeless tobacco: Never Used  Substance and Sexual Activity  . Alcohol use: No  . Drug use: No  . Sexual activity: Yes  Lifestyle  . Physical activity:    Days per week: 7 days    Minutes per session: 60 min  . Stress: Only a little  Relationships  . Social connections:    Talks on phone: More than three times a week    Gets together: More than three times a week  Attends religious service: More than 4 times per year    Active member of club or organization: Yes    Attends meetings of clubs or organizations: More than 4 times per year    Relationship status: Married  Other Topics Concern  . Not on file  Social History Narrative  . Not on file    Tobacco Use No.  Clinical Intake:  Pre-visit preparation completed: No  Pain : No/denies pain     Nutritional Status: BMI of 19-24  Normal Diabetes: No  How often do you need to have someone help you when you read instructions, pamphlets, or other written materials from your doctor or pharmacy?: 1 - Never What is the last grade level you completed in school?: 12th     Information entered by :: Chong Sicilian, RN   Activities of Daily Living In your present state of health, do you have any difficulty performing the following activities:  09/29/2017  Hearing? N  Vision? N  Comment Has yearly eye exams  Difficulty concentrating or making decisions? N  Walking or climbing stairs? N  Dressing or bathing? N  Doing errands, shopping? N  Preparing Food and eating ? N  Using the Toilet? N  In the past six months, have you accidently leaked urine? N  Do you have problems with loss of bowel control? N  Managing your Medications? N  Managing your Finances? N  Housekeeping or managing your Housekeeping? N  Some recent data might be hidden    Diet 3 meals a day. Prepares most at home.  Water, milk, orange juice. Makes tea on occasion. Seldom drinks coffee or soda.  Exercise Current Exercise Habits: Home exercise routine(works in her garden and in her home), Type of exercise: walking, Time (Minutes): 30, Frequency (Times/Week): 7, Weekly Exercise (Minutes/Week): 210, Intensity: Mild, Exercise limited by: None identified   Depression Screen PHQ 2/9 Scores 09/29/2017 08/11/2017 05/12/2017 04/19/2017 04/08/2017 02/22/2017 11/11/2016  PHQ - 2 Score 0 0 0 0 0 0 0     Fall Risk Fall Risk  09/29/2017 08/11/2017 05/12/2017 04/19/2017 04/08/2017  Falls in the past year? No No No No No    Safety Is the patient's home free of loose throw rugs in walkways, pet beds, electrical cords, etc?   yes      Grab bars in the bathroom? no      Walkin shower? no      Shower Seat? no      Handrails on the stairs?   yes and a ramp onto porch      Adequate lighting?   yes  Patient Care Team: Chevis Pretty, FNP as PCP - General (Nurse Practitioner) Calvert Cantor, MD as Consulting Physician (Ophthalmology) Druscilla Brownie, MD as Consulting Physician (Dermatology) Inda Castle, MD (Inactive) as Consulting Physician (Gastroenterology)  Hospitalizations, surgeries, and ER visits in previous 12 months No hospitalizations, ER visits, or surgeries this past year.   Objective:    Today's Vitals   09/29/17 1042  BP: (!) 142/71  Pulse: 69    Weight: 131 lb (59.4 kg)  Height: 5\' 5"  (1.651 m)   Body mass index is 21.8 kg/m.  Advanced Directives 09/29/2017 08/28/2015 08/23/2014 07/31/2014 02/12/2014 11/05/2013  Does Patient Have a Medical Advance Directive? No Yes No Yes No No  Type of Advance Directive - [place];Living will - - - -  Does patient want to make changes to medical advance directive? - No - Patient declined - Yes -  information given - -  Copy of [place] in Chart? - Yes - - - -  Would patient like information on creating a medical advance directive? No - Patient declined - Yes - Scientist, clinical (histocompatibility and immunogenetics) given - Yes - Scientist, clinical (histocompatibility and immunogenetics) given Yes - Scientist, clinical (histocompatibility and immunogenetics) given    Hearing/Vision  No hearing or vision deficits noted during visit.  Cognitive Function: MMSE - Mini Mental State Exam 09/29/2017 09/27/2016 08/28/2015 08/28/2015 08/23/2014  Orientation to time 5 5 5 5 5   Orientation to Place 5 5 5 5 5   Registration 3 3 3 3 3   Attention/ Calculation 5 5 5 5 5   Recall 3 3 2 3 3   Language- name 2 objects 2 2 2 2 2   Language- repeat 1 1 1 1 1   Language- follow 3 step command 3 3 3 3 3   Language- read & follow direction 1 1 1 1 1   Write a sentence 1 1 1 1 1   Copy design 1 1 1 1 1   Total score 30 30 29 30 30        Normal Cognitive Function Screening: Yes    Immunizations and Health Maintenance Immunization History  Administered Date(s) Administered  . Influenza, High Dose Seasonal PF 11/04/2016  . Influenza,inj,Quad PF,6+ Mos 11/22/2012, 11/05/2013, 10/30/2014, 11/04/2015  . Pneumococcal Conjugate-13 11/05/2013  . Pneumococcal Polysaccharide-23 11/19/2014   Health Maintenance Due  Topic Date Due  . INFLUENZA VACCINE  08/18/2017   Health Maintenance  Topic Date Due  . INFLUENZA VACCINE  08/18/2017  . TETANUS/TDAP  08/12/2018 (Originally 11/18/2012)  . MAMMOGRAM  09/16/2018  . DEXA SCAN  09/28/2018  . PNA vac Low Risk Adult  Completed        Assessment:    This is a routine wellness examination for [facility].    Plan:    Goals    . Exercise 3x per week (30 min per time)        Health Maintenance Recommendations: Influenza vaccine Td vaccine   Additional Screening Recommendations: Lung: Low Dose CT Chest recommended if Age 33-80 years, 30 pack-year currently smoking OR have quit w/in 15years. Patient does not qualify. Hepatitis C Screening recommended: no  Keep f/u with Chevis Pretty, FNP and any other specialty appointments you may have Continue current medications Move carefully to avoid falls. Use assistive devices like a cane or walker if needed. Aim for at least 150 minutes of moderate activity a week. This can be done with chair exercises if necessary. Read or work on puzzles daily Stay connected with friends and family  I have personally reviewed and noted the following in the patient's chart:   . Medical and social history . Use of alcohol, tobacco or illicit drugs  . Current medications and supplements . Functional ability and status . Nutritional status . Physical activity . Advanced directives . List of other physicians . Hospitalizations, surgeries, and ER visits in previous 12 months . Vitals . Screenings to include cognitive, depression, and falls . Referrals and appointments  In addition, I have reviewed and discussed with patient certain preventive protocols, quality metrics, and best practice recommendations. A written personalized care plan for preventive services as well as general preventive health recommendations were provided to patient.     Chong Sicilian, RN   09/29/2017    I have reviewed and agree with the above AWV documentation.   Jeily-Margaret Hassell Done, FNP

## 2017-10-06 DIAGNOSIS — L578 Other skin changes due to chronic exposure to nonionizing radiation: Secondary | ICD-10-CM | POA: Diagnosis not present

## 2017-10-06 DIAGNOSIS — Z85828 Personal history of other malignant neoplasm of skin: Secondary | ICD-10-CM | POA: Diagnosis not present

## 2017-11-07 ENCOUNTER — Other Ambulatory Visit: Payer: Self-pay | Admitting: Nurse Practitioner

## 2017-11-07 ENCOUNTER — Ambulatory Visit (INDEPENDENT_AMBULATORY_CARE_PROVIDER_SITE_OTHER): Payer: Medicare Other

## 2017-11-07 DIAGNOSIS — Z23 Encounter for immunization: Secondary | ICD-10-CM

## 2017-11-07 DIAGNOSIS — E782 Mixed hyperlipidemia: Secondary | ICD-10-CM

## 2017-11-14 ENCOUNTER — Ambulatory Visit (INDEPENDENT_AMBULATORY_CARE_PROVIDER_SITE_OTHER): Payer: Medicare Other | Admitting: Nurse Practitioner

## 2017-11-14 ENCOUNTER — Encounter: Payer: Self-pay | Admitting: Nurse Practitioner

## 2017-11-14 VITALS — BP 127/69 | HR 69 | Temp 96.6°F | Ht 65.0 in | Wt 131.0 lb

## 2017-11-14 DIAGNOSIS — I10 Essential (primary) hypertension: Secondary | ICD-10-CM

## 2017-11-14 DIAGNOSIS — E782 Mixed hyperlipidemia: Secondary | ICD-10-CM | POA: Diagnosis not present

## 2017-11-14 DIAGNOSIS — F411 Generalized anxiety disorder: Secondary | ICD-10-CM

## 2017-11-14 DIAGNOSIS — M810 Age-related osteoporosis without current pathological fracture: Secondary | ICD-10-CM | POA: Diagnosis not present

## 2017-11-14 MED ORDER — LISINOPRIL-HYDROCHLOROTHIAZIDE 10-12.5 MG PO TABS: 1.0000 | ORAL_TABLET | Freq: Every day | ORAL | 1 refills | Status: DC

## 2017-11-14 MED ORDER — ATORVASTATIN CALCIUM 20 MG PO TABS: 20.0000 mg | ORAL_TABLET | Freq: Every day | ORAL | 1 refills | Status: DC

## 2017-11-14 NOTE — Patient Instructions (Signed)

## 2017-11-14 NOTE — Progress Notes (Signed)
Subjective:    Patient ID: Laura Acevedo, female    DOB: 12/01/1932, 84 y.o.   MRN: [id number]   Chief Complaint: Medical Management of Chronic Issues   HPI:  1. Essential hypertension, benign  No c/o chest pain, sob or headache. Does not check blood pressure at home. BP Readings from Last 3 Encounters:  11/14/17 127/69  09/29/17 (!) 142/71  08/11/17 135/70     2. Age-related osteoporosis without current pathological fracture  Last dexascan was done 09/27/16 with t score of -2.5. Is currently on prolia.  3. GAD (generalized anxiety disorder)  Is on no meds and says she is doing okay. Gets nervous at times, but is able to handle it iwthout meds.  4. Mixed hyperlipidemia  Watches diet but does no exercise.    Outpatient Encounter Medications as of 11/14/2017  Medication Sig  . atorvastatin (LIPITOR) 20 MG tablet Take 1 tablet (20 mg total) by mouth daily.  . calcium carbonate (OS-CAL) 600 MG TABS Take 600 mg by mouth daily. AT LUNCH  . cholecalciferol (VITAMIN D) 1000 UNITS tablet Take 1,000 Units by mouth daily.   Marland Kitchen denosumab (PROLIA) 60 MG/ML SOLN injection Inject 60 mg into the skin every 6 (six) months. Administer in upper arm, thigh, or abdomen  . lisinopril-hydrochlorothiazide (PRINZIDE,ZESTORETIC) 10-12.5 MG tablet Take 1 tablet by mouth daily.  . Multiple Vitamins-Minerals (CENTRUM SILVER PO) Take 1 tablet by mouth every morning.  . Multiple Vitamins-Minerals (PRESERVISION AREDS 2 PO) Take by mouth 2 (two) times daily.     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   Review of Systems  Constitutional: Negative for activity change and appetite change.  HENT: Negative.   Eyes: Negative for pain.  Respiratory: Negative for shortness of breath.   Cardiovascular: Negative for chest pain, palpitations and leg swelling.  Gastrointestinal: Negative for abdominal pain.  Endocrine: Negative for polydipsia.    Genitourinary: Negative.   Skin: Negative for rash.  Neurological: Negative for dizziness, weakness and headaches.  Hematological: Does not bruise/bleed easily.  Psychiatric/Behavioral: Negative.   All other systems reviewed and are negative.      Objective:   Physical Exam  Constitutional: She is oriented to person, place, and time. She appears well-developed and well-nourished. No distress.  HENT:  Head: Normocephalic.  Nose: Nose normal.  Mouth/Throat: Oropharynx is clear and moist.  Eyes: Pupils are equal, round, and reactive to light. EOM are normal.  Neck: Normal range of motion. Neck supple. No JVD present. Carotid bruit is not present.  Cardiovascular: Normal rate, regular rhythm, normal heart sounds and intact distal pulses.  Pulmonary/Chest: Effort normal and breath sounds normal. No respiratory distress. She has no wheezes. She has no rales. She exhibits no tenderness.  Abdominal: Soft. Normal appearance, normal aorta and bowel sounds are normal. She exhibits no distension, no abdominal bruit, no pulsatile midline mass and no mass. There is no splenomegaly or hepatomegaly. There is no tenderness.  Musculoskeletal: Normal range of motion. She exhibits no edema.  Lymphadenopathy:    She has no cervical adenopathy.  Neurological: She is alert and oriented to person, place, and time. She has normal reflexes.  Skin: Skin is warm and dry.  Psychiatric: She has a normal mood and affect. Her behavior is normal. Judgment and thought content normal.  Nursing note and vitals reviewed.  BP 127/69   Pulse 69   Temp (!) 96.6 F (35.9 C) (Oral)  Ht 5' 5" (1.651 m)   Wt 131 lb (59.4 kg)   BMI 21.80 kg/m       Assessment & Plan:  Laura Acevedo comes in today with chief complaint of Medical Management of Chronic Issues   Diagnosis and orders addressed:  1. Essential hypertension, benign Low sodium diet - CMP14+EGFR - lisinopril-hydrochlorothiazide (PRINZIDE,ZESTORETIC)  10-12.5 MG tablet; Take 1 tablet by mouth daily.  Dispense: 90 tablet; Refill: 1  2. Age-related osteoporosis without current pathological fracture Weight bearing exercises encouraged Continue prolia as rx  3. GAD (generalized anxiety disorder) Stress management  4. Mixed hyperlipidemia Low fat diet - Lipid panel - atorvastatin (LIPITOR) 20 MG tablet; Take 1 tablet (20 mg total) by mouth daily.  Dispense: 90 tablet; Refill: 1   Labs pending Health Maintenance reviewed Diet and exercise encouraged  Follow up plan: 6 months   Giannina-Margaret Hassell Done, FNP

## 2017-11-15 LAB — CMP14+EGFR
A/G RATIO: 2 (ref 1.2–2.2)
ALBUMIN: 4.5 g/dL (ref 3.5–4.7)
ALT: 25 IU/L (ref 0–32)
AST: 27 IU/L (ref 0–40)
Alkaline Phosphatase: 53 IU/L (ref 39–117)
BILIRUBIN TOTAL: 1 mg/dL (ref 0.0–1.2)
BUN/Creatinine Ratio: 22 (ref 12–28)
BUN: 24 mg/dL (ref 8–27)
CALCIUM: 10 mg/dL (ref 8.7–10.3)
CHLORIDE: 100 mmol/L (ref 96–106)
CO2: 25 mmol/L (ref 20–29)
Creatinine, Ser: 1.08 mg/dL — ABNORMAL HIGH (ref 0.57–1.00)
GFR, EST AFRICAN AMERICAN: 55 mL/min/{1.73_m2} — AB (ref >59)
GFR, EST NON AFRICAN AMERICAN: 48 mL/min/{1.73_m2} — AB (ref >59)
GLOBULIN, TOTAL: 2.3 g/dL (ref 1.5–4.5)
Glucose: 96 mg/dL (ref 65–99)
POTASSIUM: 4.8 mmol/L (ref 3.5–5.2)
SODIUM: 142 mmol/L (ref 134–144)
TOTAL PROTEIN: 6.8 g/dL (ref 6.0–8.5)

## 2017-11-15 LAB — LIPID PANEL
Chol/HDL Ratio: 1.7 ratio (ref 0.0–4.4)
Cholesterol, Total: 165 mg/dL (ref 100–199)
HDL: 96 mg/dL (ref >39)
LDL Calculated: 55 mg/dL (ref 0–99)
TRIGLYCERIDES: 69 mg/dL (ref 0–149)
VLDL Cholesterol Cal: 14 mg/dL (ref 5–40)

## 2017-12-09 ENCOUNTER — Ambulatory Visit (INDEPENDENT_AMBULATORY_CARE_PROVIDER_SITE_OTHER): Payer: Medicare Other | Admitting: *Deleted

## 2017-12-09 DIAGNOSIS — M810 Age-related osteoporosis without current pathological fracture: Secondary | ICD-10-CM

## 2017-12-09 MED ORDER — DENOSUMAB 60 MG/ML ~~LOC~~ SOSY
60.0000 mg | PREFILLED_SYRINGE | Freq: Once | SUBCUTANEOUS | Status: AC
  Administered 2017-12-09: 60 mg via SUBCUTANEOUS

## 2017-12-09 NOTE — Progress Notes (Signed)
Pt given Prolia inj Tolerated well Last Dexa 09/27/2016 Buy and bill Next inj due 06/10/2018

## 2018-03-27 DIAGNOSIS — Z85820 Personal history of malignant melanoma of skin: Secondary | ICD-10-CM | POA: Diagnosis not present

## 2018-03-27 DIAGNOSIS — L570 Actinic keratosis: Secondary | ICD-10-CM | POA: Diagnosis not present

## 2018-03-27 DIAGNOSIS — Z1283 Encounter for screening for malignant neoplasm of skin: Secondary | ICD-10-CM | POA: Diagnosis not present

## 2018-03-27 DIAGNOSIS — D225 Melanocytic nevi of trunk: Secondary | ICD-10-CM | POA: Diagnosis not present

## 2018-03-27 DIAGNOSIS — Z08 Encounter for follow-up examination after completed treatment for malignant neoplasm: Secondary | ICD-10-CM | POA: Diagnosis not present

## 2018-04-06 DIAGNOSIS — D2371 Other benign neoplasm of skin of right lower limb, including hip: Secondary | ICD-10-CM | POA: Diagnosis not present

## 2018-04-06 DIAGNOSIS — D2372 Other benign neoplasm of skin of left lower limb, including hip: Secondary | ICD-10-CM | POA: Diagnosis not present

## 2018-05-12 ENCOUNTER — Telehealth: Payer: Self-pay | Admitting: *Deleted

## 2018-05-12 NOTE — Telephone Encounter (Signed)
PROLIA: Summary of Benefits Interpretation  May 12, 2018     Purchase Information  Last purchase location: [facility] [employer] . Primary: [place] [facility]  . Secondary:    Summary of Benefits  . Received on: 05/12/2018 . Estimated Cost to Patient: $250 . Prior authorization required: No   . Physician Purchase Covered: Yes  . Specialty Pharmacy Covered:No     Patient notified of cost, and is agreeable.  Scheduled for Prolia  injection on 06/13/2018.   Hulen Skains, Kaileah Shevchenko M   05/12/2018 [place] [phone]

## 2018-05-16 ENCOUNTER — Ambulatory Visit (INDEPENDENT_AMBULATORY_CARE_PROVIDER_SITE_OTHER): Payer: Medicare Other | Admitting: Nurse Practitioner

## 2018-05-16 ENCOUNTER — Encounter: Payer: Self-pay | Admitting: Nurse Practitioner

## 2018-05-16 ENCOUNTER — Other Ambulatory Visit: Payer: Self-pay

## 2018-05-16 DIAGNOSIS — E782 Mixed hyperlipidemia: Secondary | ICD-10-CM

## 2018-05-16 DIAGNOSIS — M810 Age-related osteoporosis without current pathological fracture: Secondary | ICD-10-CM

## 2018-05-16 DIAGNOSIS — F411 Generalized anxiety disorder: Secondary | ICD-10-CM

## 2018-05-16 DIAGNOSIS — I10 Essential (primary) hypertension: Secondary | ICD-10-CM | POA: Diagnosis not present

## 2018-05-16 MED ORDER — LISINOPRIL-HYDROCHLOROTHIAZIDE 10-12.5 MG PO TABS: 1.0000 | ORAL_TABLET | Freq: Every day | ORAL | 1 refills | Status: DC

## 2018-05-16 MED ORDER — ATORVASTATIN CALCIUM 20 MG PO TABS
20.0000 mg | ORAL_TABLET | Freq: Every day | ORAL | 1 refills | Status: DC
Start: 2018-05-16 — End: 2018-08-18

## 2018-05-16 NOTE — Progress Notes (Signed)
Patient ID: Laura Acevedo, female   DOB: 08/11/1932, 85 y.o.   MRN: [id number]     Virtual Visit via telephone Note  I connected with Laura Acevedo on 05/16/18 at *10: AM by telephone and verified that I am speaking with the correct person using two identifiers. Laura Acevedo is currently located at home and her husband is currently with her during visit. The provider, Heela-Margaret Hassell Done, FNP is located in their office at time of visit.  I discussed the limitations, risks, security and privacy concerns of performing an evaluation and management service by telephone and the availability of in person appointments. I also discussed with the patient that there may be a patient responsible charge related to this service. The patient expressed understanding and agreed to proceed.   History and Present Illness:   Chief Complaint: Medical Management of Chronic Issues    HPI:  1. Essential hypertension, benign No /o chest pain, sob or headache. Does not check blood pressure at home. BP Readings from Last 3 Encounters:  11/14/17 127/69  09/29/17 (!) 142/71  08/11/17 135/70     2. Mixed hyperlipidemia Watches diet but does no dedicated exercise. Says he stays active and busy all the time.  3. GAD (generalized anxiety disorder) Is on no meds andis doing okay.  4. Age-related osteoporosis without current pathological fracture She is on prolia injections. No c/o back pain    Outpatient Encounter Medications as of 05/16/2018  Medication Sig  . atorvastatin (LIPITOR) 20 MG tablet Take 1 tablet (20 mg total) by mouth daily.  . calcium carbonate (OS-CAL) 600 MG TABS Take 600 mg by mouth daily. AT LUNCH  . cholecalciferol (VITAMIN D) 1000 UNITS tablet Take 1,000 Units by mouth daily.   Marland Kitchen denosumab (PROLIA) 60 MG/ML SOLN injection Inject 60 mg into the skin every 6 (six) months. Administer in upper arm, thigh, or abdomen  . lisinopril-hydrochlorothiazide (PRINZIDE,ZESTORETIC) 10-12.5 MG tablet  Take 1 tablet by mouth daily.  . Multiple Vitamins-Minerals (CENTRUM SILVER PO) Take 1 tablet by mouth every morning.  . Multiple Vitamins-Minerals (PRESERVISION AREDS 2 PO) Take by mouth 2 (two) times daily.      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  Eyes: Negative for blurred vision, double vision and pain.  Respiratory: Negative for shortness of breath.   Cardiovascular: Negative for chest pain, palpitations, orthopnea and leg swelling.  Gastrointestinal: Negative for abdominal pain.  Skin: Negative for rash.  Neurological: Negative for dizziness, sensory change, loss of consciousness, weakness and headaches.  Endo/Heme/Allergies: Negative for polydipsia. Does not bruise/bleed easily.  Psychiatric/Behavioral: Negative for memory loss. The patient does not have insomnia.   All other systems reviewed and are negative.    Observations/Objective: Alert and oriented- answers all questions appropriately No distress noted BP 108/63 pulse 62 Weight 132 lbs  Assessment and Plan: Laura Acevedo comes in today with chief complaint of Medical Management of Chronic Issues   Diagnosis and orders addressed:  1. Essential hypertension, benign Low sodium diet - lisinopril-hydrochlorothiazide (ZESTORETIC) 10-12.5 MG tablet; Take 1 tablet by mouth daily.  Dispense: 90 tablet; Refill: 1  2. Mixed hyperlipidemia Low fat diet - atorvastatin (LIPITOR) 20 MG tablet; Take 1 tablet (20 mg total) by mouth daily.  Dispense: 90 tablet; Refill: 1  3. GAD (generalized anxiety disorder) Stress management  4. Age-related osteoporosis  without current pathological fracture Weight bearing exercises encouraged   Previous labs reviewed Health Maintenance reviewed Diet and exercise encouraged  Follow up plan: 3 months     I discussed the assessment and  treatment plan with the patient. The patient was provided an opportunity to ask questions and all were answered. The patient agreed with the plan and demonstrated an understanding of the instructions.   The patient was advised to call back or seek an in-person evaluation if the symptoms worsen or if the condition fails to improve as anticipated.  The above assessment and management plan was discussed with the patient. The patient verbalized understanding of and has agreed to the management plan. Patient is aware to call the clinic if symptoms persist or worsen. Patient is aware when to return to the clinic for a follow-up visit. Patient educated on when it is appropriate to go to the emergency department.    I provided 10 minutes of non-face-to-face time during this encounter.    Petronella-Margaret Hassell Done, FNP

## 2018-06-09 ENCOUNTER — Other Ambulatory Visit: Payer: Self-pay

## 2018-06-13 ENCOUNTER — Ambulatory Visit (INDEPENDENT_AMBULATORY_CARE_PROVIDER_SITE_OTHER): Payer: Medicare Other

## 2018-06-13 ENCOUNTER — Other Ambulatory Visit: Payer: Self-pay

## 2018-06-13 DIAGNOSIS — M810 Age-related osteoporosis without current pathological fracture: Secondary | ICD-10-CM | POA: Diagnosis not present

## 2018-06-13 MED ORDER — DENOSUMAB 60 MG/ML ~~LOC~~ SOSY
60.0000 mg | PREFILLED_SYRINGE | Freq: Once | SUBCUTANEOUS | Status: AC
  Administered 2018-06-13: 11:00:00 60 mg via SUBCUTANEOUS

## 2018-07-20 DIAGNOSIS — H353131 Nonexudative age-related macular degeneration, bilateral, early dry stage: Secondary | ICD-10-CM | POA: Diagnosis not present

## 2018-07-20 DIAGNOSIS — H35371 Puckering of macula, right eye: Secondary | ICD-10-CM | POA: Diagnosis not present

## 2018-08-17 ENCOUNTER — Ambulatory Visit: Payer: Medicare Other | Admitting: Nurse Practitioner

## 2018-08-17 ENCOUNTER — Other Ambulatory Visit: Payer: Self-pay

## 2018-08-18 ENCOUNTER — Encounter: Payer: Self-pay | Admitting: Nurse Practitioner

## 2018-08-18 ENCOUNTER — Ambulatory Visit (INDEPENDENT_AMBULATORY_CARE_PROVIDER_SITE_OTHER): Payer: Medicare Other | Admitting: Nurse Practitioner

## 2018-08-18 VITALS — BP 132/70 | HR 75 | Temp 98.4°F | Ht 65.0 in | Wt 130.0 lb

## 2018-08-18 DIAGNOSIS — F411 Generalized anxiety disorder: Secondary | ICD-10-CM | POA: Diagnosis not present

## 2018-08-18 DIAGNOSIS — M810 Age-related osteoporosis without current pathological fracture: Secondary | ICD-10-CM | POA: Diagnosis not present

## 2018-08-18 DIAGNOSIS — I10 Essential (primary) hypertension: Secondary | ICD-10-CM

## 2018-08-18 DIAGNOSIS — E782 Mixed hyperlipidemia: Secondary | ICD-10-CM | POA: Diagnosis not present

## 2018-08-18 MED ORDER — ATORVASTATIN CALCIUM 20 MG PO TABS: 20.0000 mg | ORAL_TABLET | Freq: Every day | ORAL | 1 refills | Status: DC

## 2018-08-18 MED ORDER — LISINOPRIL-HYDROCHLOROTHIAZIDE 10-12.5 MG PO TABS: 1.0000 | ORAL_TABLET | Freq: Every day | ORAL | 1 refills | Status: DC

## 2018-08-18 NOTE — Progress Notes (Signed)
Subjective:    Patient ID: Laura Acevedo, female    DOB: 05-Jun-1932, 85 y.o.   MRN: [id number]   Chief Complaint: Medical Management of Chronic Issues    HPI:  1. Essential hypertension, benign No c/o chest pain, sob or headache. Does not check blood pressure at home. BP Readings from Last 3 Encounters:  11/14/17 127/69  09/29/17 (!) 142/71  08/11/17 135/70     2. Mixed hyperlipidemia Watches fried foods in diet. Takes daily  lipitor without problems.  3. Age-related osteoporosis without current pathological fracture Has aged out of dexascans. She is currently doing prolia every 6 months.  4. GAD (generalized anxiety disorder) Worries a lot bit is not needing any medication.    Outpatient Encounter Medications as of 08/18/2018  Medication Sig  . atorvastatin (LIPITOR) 20 MG tablet Take 1 tablet (20 mg total) by mouth daily.  . calcium carbonate (OS-CAL) 600 MG TABS Take 600 mg by mouth daily. AT LUNCH  . cholecalciferol (VITAMIN D) 1000 UNITS tablet Take 1,000 Units by mouth daily.   Marland Kitchen denosumab (PROLIA) 60 MG/ML SOLN injection Inject 60 mg into the skin every 6 (six) months. Administer in upper arm, thigh, or abdomen  . lisinopril-hydrochlorothiazide (ZESTORETIC) 10-12.5 MG tablet Take 1 tablet by mouth daily.  . Multiple Vitamins-Minerals (CENTRUM SILVER PO) Take 1 tablet by mouth every morning.  . Multiple Vitamins-Minerals (PRESERVISION AREDS 2 PO) Take by mouth 2 (two) times daily.     Past Surgical History:  Procedure Laterality Date  . CATARACT EXTRACTION    . FOOT NEUROMA SURGERY Left 2005   Dr Irving Shows  . MELANOMA EXCISION Left    leg    Family History  Problem Relation Age of Onset  . Diabetes Mother   . Heart disease Mother   . Heart disease Father   . Heart attack Father   . Kidney disease Father   . Hypertension Sister   . Diabetes Brother   . Hypertension Brother   . Cancer Brother        bladder   . Diabetes Brother   . Diabetes Brother   .  Colon cancer Neg Hx     New complaints: None today  Social history: Lives with her husband  Controlled substance contract: N/A    Review of Systems  Constitutional: Negative for activity change and appetite change.  HENT: Negative.   Eyes: Negative for pain.  Respiratory: Negative for shortness of breath.   Cardiovascular: Negative for chest pain, palpitations and leg swelling.  Gastrointestinal: Negative for abdominal pain.  Endocrine: Negative for polydipsia.  Genitourinary: Negative.   Skin: Negative for rash.  Neurological: Negative for dizziness, weakness and headaches.  Hematological: Does not bruise/bleed easily.  Psychiatric/Behavioral: Negative.   All other systems reviewed and are negative.      Objective:   Physical Exam Vitals signs and nursing note reviewed.  Constitutional:      General: She is not in acute distress.    Appearance: Normal appearance. She is well-developed.  HENT:     Head: Normocephalic.     Nose: Nose normal.  Eyes:     Pupils: Pupils are equal, round, and reactive to light.  Neck:     Musculoskeletal: Normal range of motion and neck supple.     Vascular: No carotid bruit or JVD.  Cardiovascular:     Rate and Rhythm: Normal rate and regular rhythm.     Heart sounds: Normal heart sounds.  Comments: Multiple varicose veins in bil lower ext. Pulmonary:     Effort: Pulmonary effort is normal. No respiratory distress.     Breath sounds: Normal breath sounds. No wheezing or rales.  Chest:     Chest wall: No tenderness.  Abdominal:     General: Bowel sounds are normal. There is no distension or abdominal bruit.     Palpations: Abdomen is soft. There is no hepatomegaly, splenomegaly, mass or pulsatile mass.     Tenderness: There is no abdominal tenderness.  Musculoskeletal: Normal range of motion.  Lymphadenopathy:     Cervical: No cervical adenopathy.  Skin:    General: Skin is warm and dry.  Neurological:     Mental Status:  She is alert and oriented to person, place, and time.     Deep Tendon Reflexes: Reflexes are normal and symmetric.  Psychiatric:        Behavior: Behavior normal.        Thought Content: Thought content normal.        Judgment: Judgment normal.    BP 132/70   Pulse 75   Temp 98.4 F (36.9 C) (Oral)   Ht 5' 5"  (1.651 m)   Wt 130 lb (59 kg)   BMI 21.63 kg/m         Assessment & Plan:  Laura Acevedo comes in today with chief complaint of Medical Management of Chronic Issues   Diagnosis and orders addressed:  1. Essential hypertension, benign Low sodium diet - lisinopril-hydrochlorothiazide (ZESTORETIC) 10-12.5 MG tablet; Take 1 tablet by mouth daily.  Dispense: 90 tablet; Refill: 1 - CMP14+EGFR  2. Mixed hyperlipidemia Low fat diet - atorvastatin (LIPITOR) 20 MG tablet; Take 1 tablet (20 mg total) by mouth daily.  Dispense: 90 tablet; Refill: 1 - Lipid panel  3. Age-related osteoporosis without current pathological fracture Weight bearing exercises encouraged  4. GAD (generalized anxiety disorder) Stress management   Labs pending Health Maintenance reviewed Diet and exercise encouraged  Follow up plan: 6 months   [place], FNP

## 2018-08-18 NOTE — Patient Instructions (Signed)

## 2018-08-19 LAB — CMP14+EGFR
ALT: 26 IU/L (ref 0–32)
AST: 29 IU/L (ref 0–40)
Albumin/Globulin Ratio: 2.1 (ref 1.2–2.2)
Albumin: 4.6 g/dL (ref 3.6–4.6)
Alkaline Phosphatase: 52 IU/L (ref 39–117)
BUN/Creatinine Ratio: 22 (ref 12–28)
BUN: 24 mg/dL (ref 8–27)
Bilirubin Total: 1 mg/dL (ref 0.0–1.2)
CO2: 26 mmol/L (ref 20–29)
Calcium: 10 mg/dL (ref 8.7–10.3)
Chloride: 97 mmol/L (ref 96–106)
Creatinine, Ser: 1.1 mg/dL — ABNORMAL HIGH (ref 0.57–1.00)
GFR calc Af Amer: 53 mL/min/{1.73_m2} — ABNORMAL LOW (ref >59)
GFR calc non Af Amer: 46 mL/min/{1.73_m2} — ABNORMAL LOW (ref >59)
Globulin, Total: 2.2 g/dL (ref 1.5–4.5)
Glucose: 93 mg/dL (ref 65–99)
Potassium: 4.7 mmol/L (ref 3.5–5.2)
Sodium: 139 mmol/L (ref 134–144)
Total Protein: 6.8 g/dL (ref 6.0–8.5)

## 2018-08-19 LAB — LIPID PANEL
Chol/HDL Ratio: 1.9 ratio (ref 0.0–4.4)
Cholesterol, Total: 163 mg/dL (ref 100–199)
HDL: 85 mg/dL (ref >39)
LDL Calculated: 62 mg/dL (ref 0–99)
Triglycerides: 78 mg/dL (ref 0–149)
VLDL Cholesterol Cal: 16 mg/dL (ref 5–40)

## 2018-09-14 DIAGNOSIS — D2371 Other benign neoplasm of skin of right lower limb, including hip: Secondary | ICD-10-CM | POA: Diagnosis not present

## 2018-09-14 DIAGNOSIS — M79673 Pain in unspecified foot: Secondary | ICD-10-CM | POA: Diagnosis not present

## 2018-09-14 DIAGNOSIS — D2372 Other benign neoplasm of skin of left lower limb, including hip: Secondary | ICD-10-CM | POA: Diagnosis not present

## 2018-10-03 ENCOUNTER — Other Ambulatory Visit: Payer: Self-pay

## 2018-10-03 ENCOUNTER — Ambulatory Visit (INDEPENDENT_AMBULATORY_CARE_PROVIDER_SITE_OTHER): Payer: Medicare Other | Admitting: *Deleted

## 2018-10-03 DIAGNOSIS — Z0000 Encounter for general adult medical examination without abnormal findings: Secondary | ICD-10-CM | POA: Diagnosis not present

## 2018-10-03 NOTE — Progress Notes (Addendum)
MEDICARE ANNUAL WELLNESS VISIT  10/03/2018  Telephone Visit Disclaimer This Medicare AWV was conducted by telephone due to national recommendations for restrictions regarding the COVID-19 Pandemic (e.g. social distancing).  I verified, using two identifiers, that I am speaking with Laura Acevedo or their authorized healthcare agent. I discussed the limitations, risks, security, and privacy concerns of performing an evaluation and management service by telephone and the potential availability of an in-person appointment in the future. The patient expressed understanding and agreed to proceed.   Subjective:  Laura Acevedo is a 85 y.o. female patient of Shivaun, Avril, Condon who had a Medicare Annual Wellness Visit today via telephone. Raeven is Retired and lives with their spouse. she has 0 children. she reports that she is socially active and does interact with friends/family regularly. she is moderately physically active and enjoys gardening, yard work and playing the piano.  Patient Care Team: Chevis Pretty, FNP as PCP - General (Nurse Practitioner) Calvert Cantor, MD as Consulting Physician (Ophthalmology) Druscilla Brownie, MD as Consulting Physician (Dermatology) Inda Castle, MD (Inactive) as Consulting Physician (Gastroenterology)  Advanced Directives 10/03/2018 09/29/2017 08/28/2015 08/23/2014 07/31/2014 02/12/2014 11/05/2013  Does Patient Have a Medical Advance Directive? No No Yes No Yes No No  Type of Advance Directive - Public librarian;Living will - - - -  Does patient want to make changes to medical advance directive? - - No - Patient declined - Yes - information given - -  Copy of [place] in Chart? - - Yes - - - -  Would patient like information on creating a medical advance directive? No - Patient declined No - Patient declined - Yes - Scientist, clinical (histocompatibility and immunogenetics) given - Yes - Scientist, clinical (histocompatibility and immunogenetics) given Yes - Educational materials given    Hospital Utilization Over the Past 12 Months: # of hospitalizations or ER visits: 0 # of surgeries: 0  Review of Systems    Patient reports that her overall health is unchanged compared to last year.  History obtained from chart review  Patient Reported Readings (BP, Pulse, CBG, Weight, etc) none  Pain Assessment Pain : No/denies pain     Current Medications & Allergies (verified) Allergies as of 10/03/2018   No Known Allergies     Medication List       Accurate as of October 03, 2018 11:36 AM. If you have any questions, ask your nurse or doctor.        aspirin EC 81 MG tablet Take 81 mg by mouth daily.   atorvastatin 20 MG tablet Commonly known as: LIPITOR Take 1 tablet (20 mg total) by mouth daily.   calcium carbonate 600 MG Tabs tablet Commonly known as: OS-CAL Take 600 mg by mouth daily. AT LUNCH   CENTRUM SILVER PO Take 1 tablet by mouth every morning.   PRESERVISION AREDS 2 PO Take by mouth 2 (two) times daily.   cholecalciferol 1000 units tablet Commonly known as: VITAMIN D Take 1,000 Units by mouth daily.   denosumab 60 MG/ML Soln injection Commonly known as: PROLIA Inject 60 mg into the skin every 6 (six) months. Administer in upper arm, thigh, or abdomen   lisinopril-hydrochlorothiazide 10-12.5 MG tablet Commonly known as: ZESTORETIC Take 1 tablet by mouth daily.       History (reviewed): Past Medical History:  Diagnosis Date  . Cancer ([place]) 1980   melanoma  . Cataract   . Colon polyps   . Foot neuroma 2005  left  . Hyperlipidemia   . Hypertension   . Osteoporosis    Past Surgical History:  Procedure Laterality Date  . CATARACT EXTRACTION    . FOOT NEUROMA SURGERY Left 2005   Dr Irving Shows  . MELANOMA EXCISION Left    leg   Family History  Problem Relation Age of Onset  . Diabetes Mother   . Heart disease Mother   . Heart disease Father   . Heart attack Father   . Kidney disease Father   . Hypertension Sister   .  Diabetes Brother   . Hypertension Brother   . Cancer Brother        bladder   . Diabetes Brother   . Diabetes Brother   . Colon cancer Neg Hx    Social History   Socioeconomic History  . Marital status: Married    Spouse name: [name]  . Number of children: 0  . Years of education: 46  . Highest education level: 12th grade  Occupational History  . Occupation: retired    Comment: Copy  . Financial resource strain: Not hard at all  . Food insecurity    Worry: Never true    Inability: Never true  . Transportation needs    Medical: No    Non-medical: No  Tobacco Use  . Smoking status: Never Smoker  . Smokeless tobacco: Never Used  Substance and Sexual Activity  . Alcohol use: No  . Drug use: No  . Sexual activity: Not Currently  Lifestyle  . Physical activity    Days per week: 7 days    Minutes per session: 60 min  . Stress: Not at all  Relationships  . Social connections    Talks on phone: More than three times a week    Gets together: More than three times a week    Attends religious service: More than 4 times per year    Active member of club or organization: Yes    Attends meetings of clubs or organizations: More than 4 times per year    Relationship status: Married  Other Topics Concern  . Not on file  Social History Narrative  . Not on file    Activities of Daily Living In your present state of health, do you have any difficulty performing the following activities: 10/03/2018  Hearing? N  Vision? N  Comment wears glasses and has yearly eye exams  Difficulty concentrating or making decisions? N  Walking or climbing stairs? N  Dressing or bathing? N  Doing errands, shopping? N  Preparing Food and eating ? N  Using the Toilet? N  In the past six months, have you accidently leaked urine? Y  Comment wears panty liners all the time  Do you have problems with loss of bowel control? N  Managing your Medications? N  Managing your Finances? N   Housekeeping or managing your Housekeeping? N  Some recent data might be hidden    Patient Education/ Literacy How often do you need to have someone help you when you read instructions, pamphlets, or other written materials from your doctor or pharmacy?: 1 - Never What is the last grade level you completed in school?: 12th grade  Exercise Current Exercise Habits: Home exercise routine, Type of exercise: walking;Other - see comments(gardening and yard work daily), Time (Minutes): 60, Frequency (Times/Week): 7, Weekly Exercise (Minutes/Week): 420, Intensity: Moderate, Exercise limited by: None identified  Diet Patient reports consuming 3 meals a day and 1  snack(s) a day Patient reports that her primary diet is: Regular Patient reports that she does have regular access to food.   Depression Screen PHQ 2/9 Scores 10/03/2018 08/18/2018 11/14/2017 09/29/2017 08/11/2017 05/12/2017 04/19/2017  PHQ - 2 Score 0 0 0 0 0 0 0     Fall Risk Fall Risk  10/03/2018 08/18/2018 11/14/2017 09/29/2017 08/11/2017  Falls in the past year? 0 0 No No No  Number falls in past yr: 0 - - - -  Injury with Fall? 0 - - - -  Follow up Falls prevention discussed - - - -  Comment get rid of all throw rugs in the house, adequate lighting in walk ways and grab bars in the bathroom - - - -     Objective:  Laura Acevedo seemed alert and oriented and she participated appropriately during our telephone visit.  Blood Pressure Weight BMI  BP Readings from Last 3 Encounters:  08/18/18 132/70  11/14/17 127/69  09/29/17 (!) 142/71   Wt Readings from Last 3 Encounters:  08/18/18 130 lb (59 kg)  11/14/17 131 lb (59.4 kg)  09/29/17 131 lb (59.4 kg)   BMI Readings from Last 1 Encounters:  08/18/18 21.63 kg/m    *Unable to obtain current vital signs, weight, and BMI due to telephone visit type  Hearing/Vision  . Luvena did not seem to have difficulty with hearing/understanding during the telephone conversation . Reports that she  has had a formal eye exam by an eye care professional within the past year . Reports that she has not had a formal hearing evaluation within the past year *Unable to fully assess hearing and vision during telephone visit type  Cognitive Function: 6CIT Screen 10/03/2018  What Year? 0 points  What month? 0 points  What time? 0 points  Count back from 20 0 points  Months in reverse 0 points  Repeat phrase 0 points  Total Score 0   (Normal:0-7, Significant for Dysfunction: >8)  Normal Cognitive Function Screening: Yes   Immunization & Health Maintenance Record Immunization History  Administered Date(s) Administered  . Influenza, High Dose Seasonal PF 11/04/2016, 11/07/2017  . Influenza,inj,Quad PF,6+ Mos 11/22/2012, 11/05/2013, 10/30/2014, 11/04/2015  . Pneumococcal Conjugate-13 11/05/2013  . Pneumococcal Polysaccharide-23 11/19/2014    Health Maintenance  Topic Date Due  . TETANUS/TDAP  11/18/2012  . INFLUENZA VACCINE  08/19/2018  . MAMMOGRAM  09/16/2018  . DEXA SCAN  09/28/2018  . PNA vac Low Risk Adult  Completed       Assessment  This is a routine wellness examination for Laura Acevedo.  Health Maintenance: Due or Overdue Health Maintenance Due  Topic Date Due  . TETANUS/TDAP  11/18/2012  . INFLUENZA VACCINE  08/19/2018  . MAMMOGRAM  09/16/2018  . DEXA SCAN  09/28/2018    Laura Acevedo does not need a referral for Community Assistance: Care Management:   no Social Work:    no Prescription Assistance:  no Nutrition/Diabetes Education:  no   Plan:  Personalized Goals Goals Addressed            This Visit's Progress   . DIET - INCREASE WATER INTAKE       Try to drink 6-8 glasses of water daily.      Personalized Health Maintenance & Screening Recommendations  Influenza vaccine Screening mammography Bone densitometry screening pt declines Shingrix and TDAP vaccines  Lung Cancer Screening Recommended: no (Low Dose CT Chest recommended if Age 43-80  years, 30 pack-year currently  smoking OR have quit w/in past 15 years) Hepatitis C Screening recommended: no HIV Screening recommended: no  Advanced Directives: Written information was not prepared per patient's request.  Referrals & Orders No orders of the defined types were placed in this encounter.   Follow-up Plan . Follow-up with Chevis Pretty, FNP as planned . Schedule your Screening Mammogram and DEXA as discussed . Get your Flu vaccine at your next visit with your PCP   I have personally reviewed and noted the following in the patient's chart:   . Medical and social history . Use of alcohol, tobacco or illicit drugs  . Current medications and supplements . Functional ability and status . Nutritional status . Physical activity . Advanced directives . List of other physicians . Hospitalizations, surgeries, and ER visits in previous 12 months . Vitals . Screenings to include cognitive, depression, and falls . Referrals and appointments  In addition, I have reviewed and discussed with Laura Acevedo certain preventive protocols, quality metrics, and best practice recommendations. A written personalized care plan for preventive services as well as general preventive health recommendations is available and can be mailed to the patient at her request.      Marylin Crosby, LPN  [id number]  I have reviewed and agree with the above AWV documentation.   Miniya-Margaret Hassell Done, FNP

## 2018-10-03 NOTE — Patient Instructions (Signed)
Preventive Care 83 Years and Older, Female Preventive care refers to lifestyle choices and visits with your health care provider that can promote health and wellness. This includes:  A yearly physical exam. This is also called an annual well check.  Regular dental and eye exams.  Immunizations.  Screening for certain conditions.  Healthy lifestyle choices, such as diet and exercise. What can I expect for my preventive care visit? Physical exam Your health care provider will check:  Height and weight. These may be used to calculate body mass index (BMI), which is a measurement that tells if you are at a healthy weight.  Heart rate and blood pressure.  Your skin for abnormal spots. Counseling Your health care provider may ask you questions about:  Alcohol, tobacco, and drug use.  Emotional well-being.  Home and relationship well-being.  Sexual activity.  Eating habits.  History of falls.  Memory and ability to understand (cognition).  Work and work Statistician.  Pregnancy and menstrual history. What immunizations do I need?  Influenza (flu) vaccine  This is recommended every year. Tetanus, diphtheria, and pertussis (Tdap) vaccine  You may need a Td booster every 10 years. Varicella (chickenpox) vaccine  You may need this vaccine if you have not already been vaccinated. Zoster (shingles) vaccine  You may need this after age 33. Pneumococcal conjugate (PCV13) vaccine  One dose is recommended after age 33. Pneumococcal polysaccharide (PPSV23) vaccine  One dose is recommended after age 72. Measles, mumps, and rubella (MMR) vaccine  You may need at least one dose of MMR if you were born in 1957 or later. You may also need a second dose. Meningococcal conjugate (MenACWY) vaccine  You may need this if you have certain conditions. Hepatitis A vaccine  You may need this if you have certain conditions or if you travel or work in places where you may be exposed  to hepatitis A. Hepatitis B vaccine  You may need this if you have certain conditions or if you travel or work in places where you may be exposed to hepatitis B. Haemophilus influenzae type b (Hib) vaccine  You may need this if you have certain conditions. You may receive vaccines as individual doses or as more than one vaccine together in one shot (combination vaccines). Talk with your health care provider about the risks and benefits of combination vaccines. What tests do I need? Blood tests  Lipid and cholesterol levels. These may be checked every 5 years, or more frequently depending on your overall health.  Hepatitis C test.  Hepatitis B test. Screening  Lung cancer screening. You may have this screening every year starting at age 39 if you have a 30-pack-year history of smoking and currently smoke or have quit within the past 15 years.  Colorectal cancer screening. All adults should have this screening starting at age 36 and continuing until age 15. Your health care provider may recommend screening at age 23 if you are at increased risk. You will have tests every 1-10 years, depending on your results and the type of screening test.  Diabetes screening. This is done by checking your blood sugar (glucose) after you have not eaten for a while (fasting). You may have this done every 1-3 years.  Mammogram. This may be done every 1-2 years. Talk with your health care provider about how often you should have regular mammograms.  BRCA-related cancer screening. This may be done if you have a family history of breast, ovarian, tubal, or peritoneal cancers.  Other tests  Sexually transmitted disease (STD) testing.  Bone density scan. This is done to screen for osteoporosis. You may have this done starting at age 76. Follow these instructions at home: Eating and drinking  Eat a diet that includes fresh fruits and vegetables, whole grains, lean protein, and low-fat dairy products. Limit  your intake of foods with high amounts of sugar, saturated fats, and salt.  Take vitamin and mineral supplements as recommended by your health care provider.  Do not drink alcohol if your health care provider tells you not to drink.  If you drink alcohol: ? Limit how much you have to 0-1 drink a day. ? Be aware of how much alcohol is in your drink. In the [place] one drink equals one 12 oz bottle of beer (355 mL), one 5 oz glass of wine (148 mL), or one 1 oz glass of hard liquor (44 mL). Lifestyle  Take daily care of your teeth and gums.  Stay active. Exercise for at least 30 minutes on 5 or more days each week.  Do not use any products that contain nicotine or tobacco, such as cigarettes, e-cigarettes, and chewing tobacco. If you need help quitting, ask your health care provider.  If you are sexually active, practice safe sex. Use a condom or other form of protection in order to prevent STIs (sexually transmitted infections).  Talk with your health care provider about taking a low-dose aspirin or statin. What's next?  Go to your health care provider once a year for a well check visit.  Ask your health care provider how often you should have your eyes and teeth checked.  Stay up to date on all vaccines. This information is not intended to replace advice given to you by your health care provider. Make sure you discuss any questions you have with your health care provider. Document Released: 01/31/2015 Document Revised: 12/29/2017 Document Reviewed: 12/29/2017 Elsevier Patient Education  2020 [employer].

## 2018-10-04 ENCOUNTER — Other Ambulatory Visit: Payer: Self-pay

## 2018-10-05 ENCOUNTER — Encounter: Payer: Self-pay | Admitting: Nurse Practitioner

## 2018-10-05 ENCOUNTER — Ambulatory Visit (INDEPENDENT_AMBULATORY_CARE_PROVIDER_SITE_OTHER): Payer: Medicare Other | Admitting: Nurse Practitioner

## 2018-10-05 VITALS — BP 132/70 | HR 66 | Temp 98.7°F | Resp 20 | Ht 65.0 in | Wt 132.0 lb

## 2018-10-05 DIAGNOSIS — I491 Atrial premature depolarization: Secondary | ICD-10-CM | POA: Diagnosis not present

## 2018-10-05 DIAGNOSIS — I499 Cardiac arrhythmia, unspecified: Secondary | ICD-10-CM | POA: Diagnosis not present

## 2018-10-05 NOTE — Progress Notes (Signed)
   Subjective:    Patient ID: Laura Acevedo, female    DOB: 17-May-1932, 85 y.o.   MRN: [id number]  Patient had [facility] visit with [facility] and she discovered an irregular heart beat and possible heart murmur, that she wanted to be checked out by primary care provider. Patient denies feeling palpitation or feeling as if heart is racing. She does not have a history of atrial fib. Her last EKG was done 02/22/17 which showed runs of atrial tachycardia.    Review of Systems  Constitutional: Negative for activity change and appetite change.  HENT: Negative.   Eyes: Negative for pain.  Respiratory: Negative for shortness of breath.   Cardiovascular: Negative for chest pain, palpitations and leg swelling.  Gastrointestinal: Negative for abdominal pain.  Endocrine: Negative for polydipsia.  Genitourinary: Negative.   Skin: Negative for rash.  Neurological: Negative for dizziness, weakness and headaches.  Hematological: Does not bruise/bleed easily.  Psychiatric/Behavioral: Negative.   All other systems reviewed and are negative.      Objective:   Physical Exam Vitals signs and nursing note reviewed.  Constitutional:      General: She is not in acute distress.    Appearance: Normal appearance. She is well-developed.  HENT:     Head: Normocephalic.     Nose: Nose normal.  Eyes:     Pupils: Pupils are equal, round, and reactive to light.  Neck:     Musculoskeletal: Normal range of motion and neck supple.     Vascular: No carotid bruit or JVD.  Cardiovascular:     Rate and Rhythm: Normal rate and regular rhythm.     Heart sounds: Normal heart sounds. No murmur.  Pulmonary:     Effort: Pulmonary effort is normal. No respiratory distress.     Breath sounds: Normal breath sounds. No wheezing or rales.  Chest:     Chest wall: No tenderness.  Abdominal:     General: Bowel sounds are normal. There is no distension or abdominal bruit.     Palpations: Abdomen is soft. There is no  hepatomegaly, splenomegaly, mass or pulsatile mass.     Tenderness: There is no abdominal tenderness.  Musculoskeletal: Normal range of motion.  Lymphadenopathy:     Cervical: No cervical adenopathy.  Skin:    General: Skin is warm and dry.  Neurological:     Mental Status: She is alert and oriented to person, place, and time.     Deep Tendon Reflexes: Reflexes are normal and symmetric.  Psychiatric:        Behavior: Behavior normal.        Thought Content: Thought content normal.        Judgment: Judgment normal.    BP 132/70   Pulse 66   Temp 98.7 F (37.1 C) (Oral)   Resp 20   Ht 5\' 5"  (1.651 m)   Wt 132 lb (59.9 kg)   SpO2 98%   BMI 21.97 kg/m   EKG- NSR with occasional PAC's     Assessment & Plan:  Laura Acevedo in today with chief complaint of Irregular Heart Beat   1. Irregular heart beat - EKG 12-Lead  2. PAC (premature atrial contraction) Occasional Continue all meds Keep all follow up appointments  Shakirra-Margaret Hassell Done, FNP

## 2018-10-05 NOTE — Patient Instructions (Signed)
Premature Atrial Contraction  A premature atrial contraction (PAC) is a kind of irregular heartbeat (arrhythmia). It happens when the heart beats too early and then pauses before beating again. The heart has four areas, or chambers. Normally, electrical signals spread across the heart and make all the chambers beat together. During a PAC, the upper chambers of the heart (atria) beat too early, before they have had time to fill with blood. The heartbeat pauses afterward so the heart can fill with blood for the next beat. Sometimes PAC can be a warning sign of another type of arrhythmia called atrial fibrillation. Atrial fibrillation may allow blood to pool in the atria and form clots. If a clot travels to the brain, it can cause a stroke. What are the causes? The cause of this condition is often unknown. Sometimes, this condition may be caused by heart disease or injury to the heart. What increases the risk? You are more likely to develop this condition if:  You are a child.  You are an adult who is 50 years of age or older. Episodes may be triggered by:  Caffeine.  Alcohol.  Tobacco use.  Stimulant drugs.  Some medicines or supplements.  Stress.  Heart disease. What are the signs or symptoms? Symptoms of this condition include:  A feeling that your heart skipped a beat. The first heartbeat after the "skipped" beat may feel more forceful.  A feeling that your heart is fluttering. How is this diagnosed? This condition is diagnosed based on:  Your symptoms.  A physical exam. Your health care provider may listen to your heart.  An electrocardiogram (ECG). This is a test that records the electrical impulses of the heart.  An ambulatory cardiac monitor. This device records your heartbeats for 24 hours or more. You may also have:  An echocardiogram to check for any heart conditions. This is a type of imaging test that uses sound waves (ultrasound) to make images of your heart.   Blood tests. How is this treated? Treatment depends on the frequency of your symptoms and other risk factors. Treatments may include:  Medicines (beta-blockers).  Catheter ablation. This is done to destroy the part of the heart tissue that sends abnormal signals. In some cases, treatment may not be needed for this condition. Follow these instructions at home: Lifestyle  Do not use any products that contain nicotine or tobacco, such as cigarettes, e-cigarettes, and chewing tobacco. If you need help quitting, ask your health care provider.  Exercise regularly. Ask your health care provider what type of exercise is safe for you.  Find healthy ways to manage stress.  Try to get at least 7-9 hours of sleep each night, or as much as recommended by your health care provider. Alcohol use  Do not drink alcohol if: ? Your health care provider tells you not to drink. ? You are pregnant, may be pregnant, or are planning to become pregnant. ? Alcohol triggers your episodes.  If you drink alcohol: ? Limit how much you use to:  0-1 drink a day for women.  0-2 drinks a day for men. ? Be aware of how much alcohol is in your drink. In the [place] one drink equals one 12 oz bottle of beer (355 mL), one 5 oz glass of wine (148 mL), or one 1 oz glass of hard liquor (44 mL). General instructions  Take over-the-counter and prescription medicines only as told by your health care provider.  If caffeine triggers episodes, do not eat,   drink, or use anything with caffeine in it.  Keep all follow-up visits as told by your health care provider. This is important. Contact a health care provider if:  You feel your heart skipping beats.  Your heart skips beats and you feel dizzy, light-headed, or very tired. Get help right away if you have:  Chest pain.  Trouble breathing.  Any symptoms of a stroke. "BE FAST" is an easy way to remember the main warning signs of a stroke. ? B - Balance. Signs are  dizziness, sudden trouble walking, or loss of balance. ? E - Eyes. Signs are trouble seeing or a sudden change in vision. ? F - Face. Signs are sudden weakness or numbness of the face, or the face or eyelid drooping on one side. ? A - Arms. Signs are weakness or numbness in an arm. This happens suddenly and usually on one side of the body. ? S - Speech. Signs are sudden trouble speaking, slurred speech, or trouble understanding what people say. ? T - Time. Time to call emergency services. Write down what time symptoms started.  Other signs of stroke, such as: ? A sudden, severe headache with no known cause. ? Nausea or vomiting. ? Seizure. These symptoms may represent a serious problem that is an emergency. Do not wait to see if the symptoms will go away. Get medical help right away. Call your local emergency services (911 in the [place] Do not drive yourself to the hospital. Summary  A premature atrial contraction (PAC) is a kind of irregular heartbeat (arrhythmia). It happens when the heart beats too early and then pauses before beating again.  Treatment depends on your symptoms and whether you have other underlying heart conditions.  Contact a health care provider if your heart skips beats and you feel dizzy, light-headed, or very tired.  In some cases, this condition may lead to a stroke. "BE FAST" is an easy way to remember the warning signs of stroke. Get help right away if you have any of the "BE FAST" signs. This information is not intended to replace advice given to you by your health care provider. Make sure you discuss any questions you have with your health care provider. Document Released: 09/07/2013 Document Revised: 09/29/2017 Document Reviewed: 09/29/2017 Elsevier Patient Education  2020 [employer]  

## 2018-11-02 ENCOUNTER — Other Ambulatory Visit: Payer: Self-pay

## 2018-11-03 ENCOUNTER — Ambulatory Visit (INDEPENDENT_AMBULATORY_CARE_PROVIDER_SITE_OTHER): Payer: Medicare Other | Admitting: *Deleted

## 2018-11-03 DIAGNOSIS — Z23 Encounter for immunization: Secondary | ICD-10-CM

## 2018-12-12 IMAGING — CT CT ABD-PELV W/ CM
2 of 4 series · 13 of 36 positions shown, 16 images · IV contrast (Isovue)
Comparison: Chest radiographs dated 02/22/2017. Abdomen and pelvis
CT report dated 06/26/2002.

CLINICAL DATA: 8 lb weight loss over the past 6 months. History of
melanoma in 1404.

EXAM:
CT CHEST, ABDOMEN, AND PELVIS WITH CONTRAST
TECHNIQUE: Multidetector CT imaging of the chest, abdomen and pelvis was
performed following the standard protocol during bolus
administration of intravenous contrast.
CONTRAST:  100mL [id number] IOPAMIDOL ([id number]-[id number]) INJECTION 61%

[Series 3: cap with · axial · 0.59mm/px · z∈[+1690,+2200]mm · 10 of 120 slices shown, 13 images]
[im 12/120  mediastinal]
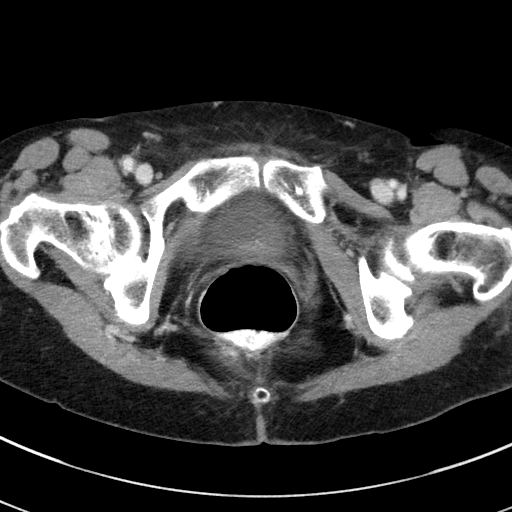
[im 12/120  lung]
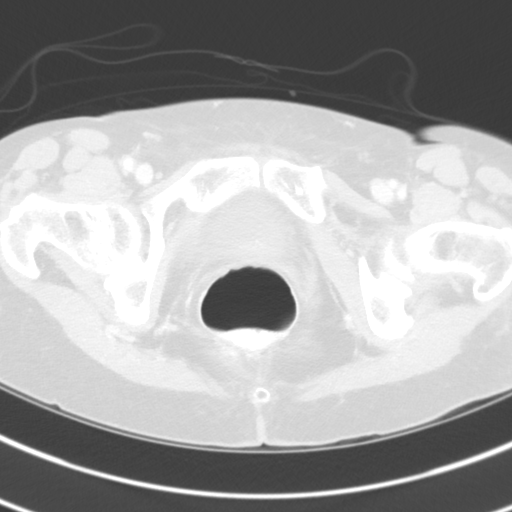
[im 23/120  lung]
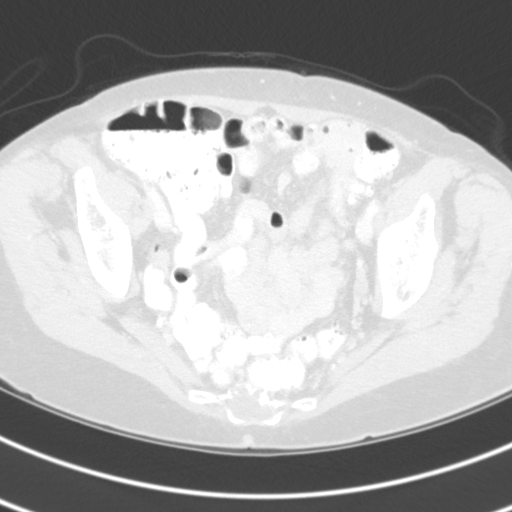
[im 35/120  lung]
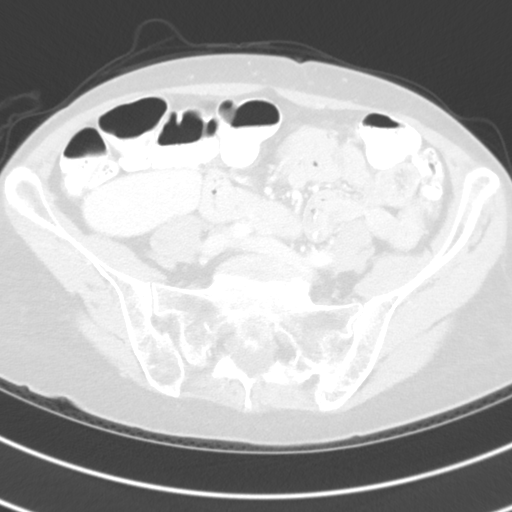
[im 46/120  lung]
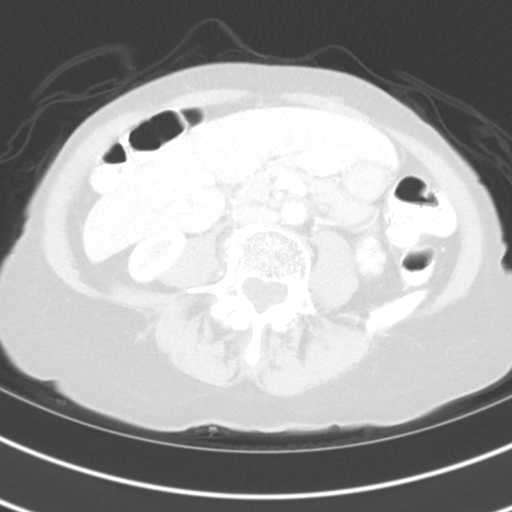
[im 57/120  mediastinal]
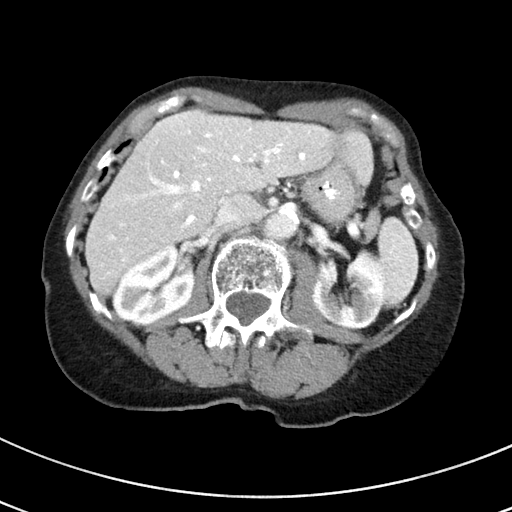
[im 57/120  lung]
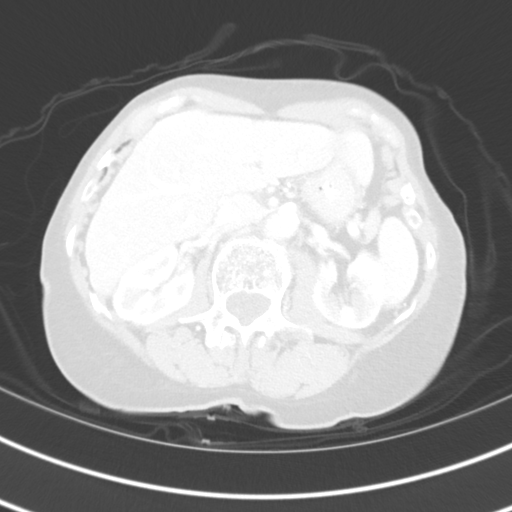
[im 69/120  lung]
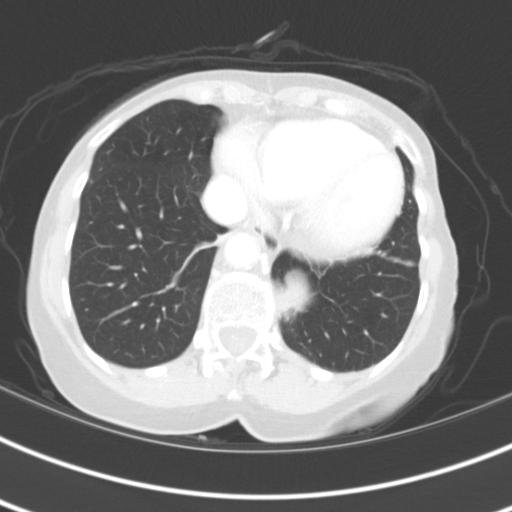
[im 80/120  lung]
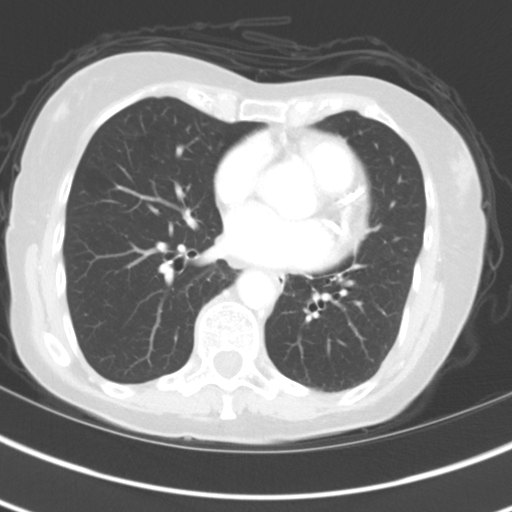
[im 91/120  lung]
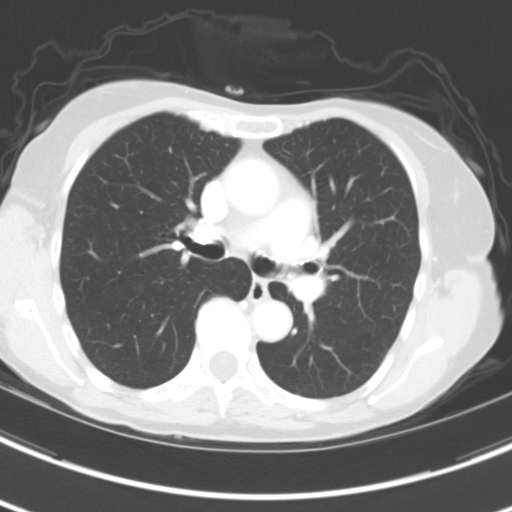
[im 103/120  mediastinal]
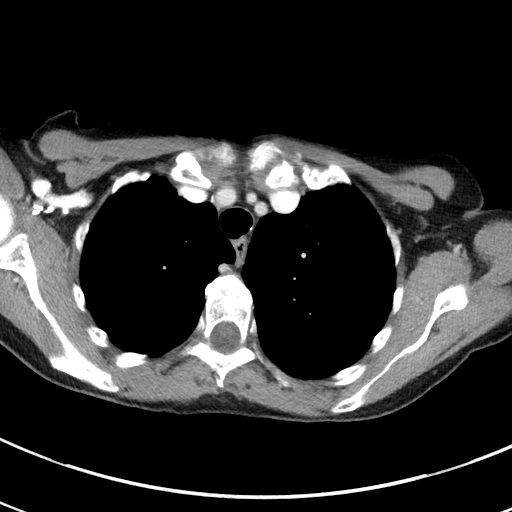
[im 103/120  lung]
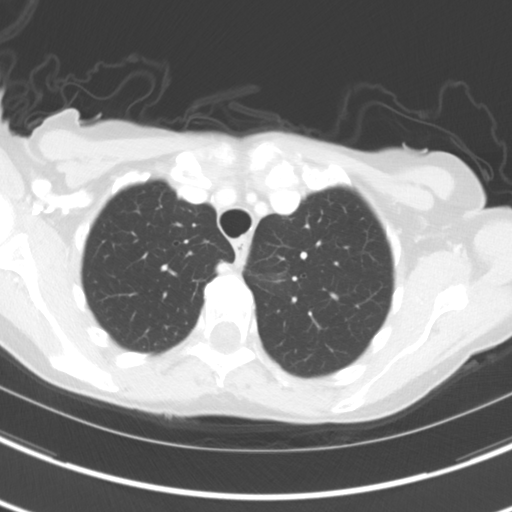
[im 114/120  lung]
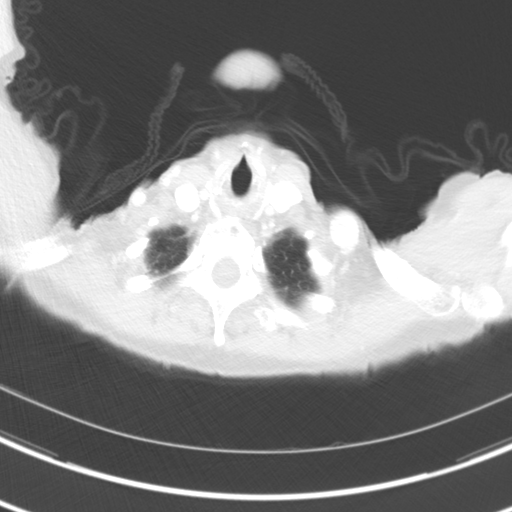

[Series 6: coronals · coronal · 0.66mm/px · 3 of 137 slices shown]
[im 28/137  lung]
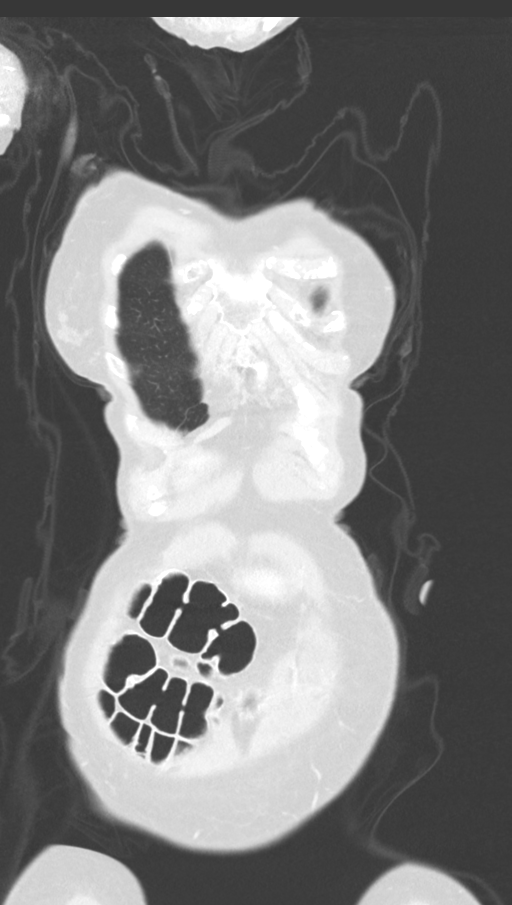
[im 55/137  lung]
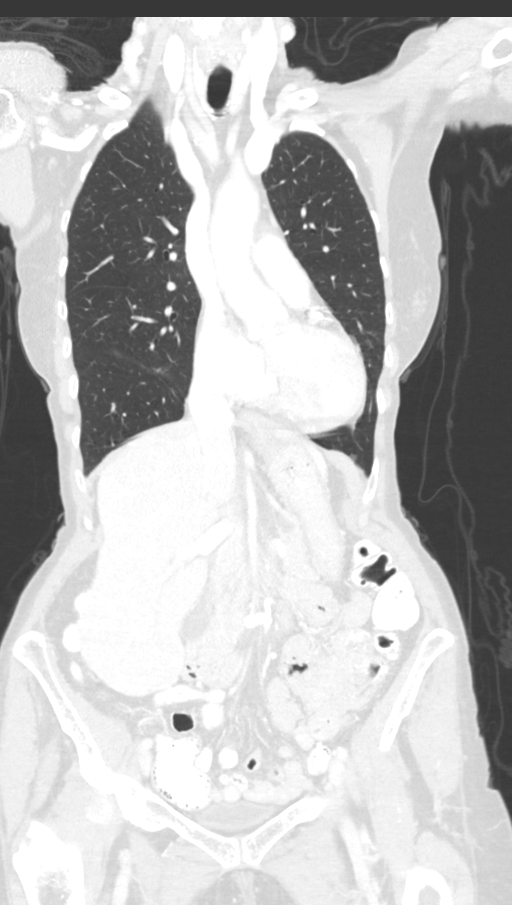
[im 82/137  lung]
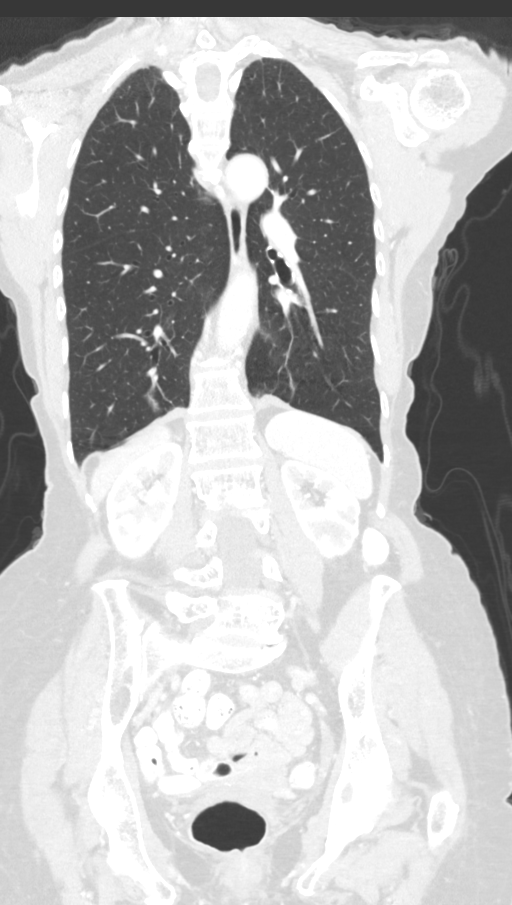

[13 of 36 positions shown; findings below may reference images not displayed]

FINDINGS: CT CHEST FINDINGS

Cardiovascular: Atheromatous calcifications, including the coronary
arteries and aorta.

Mediastinum/Nodes: No enlarged mediastinal, hilar, or axillary lymph
nodes. 1.2 cm left lobe thyroid nodule and 0.9 cm right lobe thyroid
nodule. Trachea and esophagus demonstrate no significant findings.

Lungs/Pleura: The lungs are hyperexpanded with a small amount of
linear scarring at both lung bases. No lung nodules or pleural
fluid.

Musculoskeletal: Mild thoracic and lower cervical spine degenerative
changes. Mild scoliosis.

CT ABDOMEN PELVIS FINDINGS

Hepatobiliary: No focal liver abnormality is seen. No gallstones,
gallbladder wall thickening, or biliary dilatation.

Pancreas: Unremarkable. No pancreatic ductal dilatation or
surrounding inflammatory changes.

Spleen: Normal in size without focal abnormality.

Adrenals/Urinary Tract: Adrenal glands are unremarkable. Kidneys are
normal, without renal calculi, focal lesion, or hydronephrosis.
Bladder is unremarkable.

Stomach/Bowel: Mild colonic diverticulosis. Unremarkable stomach and
small bowel. No evidence of appendicitis.

Vascular/Lymphatic: Atheromatous arterial calcifications without
aneurysm. No enlarged lymph nodes.

Reproductive: Small uterus containing a 4.2 cm oval, low density
mass with coarse peripheral calcifications, compatible with a
degenerated fibroid. No adnexal masses.

Other: No abdominal wall hernia or abnormality. No abdominopelvic
ascites.

Musculoskeletal: Lumbar spine degenerative changes. Previously noted
sacral Tarlov cysts.
IMPRESSION: 1. No acute abnormality.
2. No evidence of metastatic disease.
3. Mild changes of COPD.
4.  Calcific coronary artery and aortic atherosclerosis.
5. 1.2 cm left lobe thyroid nodule and 0.9 cm right lobe thyroid
nodule. Consider further evaluation with thyroid ultrasound. If
patient is clinically hyperthyroid, consider nuclear medicine
thyroid uptake and scan.
[DATE] cm uterine fibroid.
7. Mild colonic diverticulosis.

## 2018-12-18 ENCOUNTER — Ambulatory Visit (INDEPENDENT_AMBULATORY_CARE_PROVIDER_SITE_OTHER): Payer: Medicare Other

## 2018-12-18 ENCOUNTER — Other Ambulatory Visit: Payer: Self-pay

## 2018-12-18 DIAGNOSIS — M810 Age-related osteoporosis without current pathological fracture: Secondary | ICD-10-CM

## 2018-12-18 MED ORDER — DENOSUMAB 60 MG/ML ~~LOC~~ SOSY
60.0000 mg | PREFILLED_SYRINGE | Freq: Once | SUBCUTANEOUS | Status: AC
  Administered 2018-12-18: 15:00:00 60 mg via SUBCUTANEOUS

## 2018-12-18 NOTE — Progress Notes (Signed)
Prolia injection given to left arm.  Patient tolerated well. 

## 2019-01-22 ENCOUNTER — Other Ambulatory Visit: Payer: Self-pay | Admitting: Nurse Practitioner

## 2019-01-22 DIAGNOSIS — I10 Essential (primary) hypertension: Secondary | ICD-10-CM

## 2019-01-22 DIAGNOSIS — E782 Mixed hyperlipidemia: Secondary | ICD-10-CM

## 2019-02-16 ENCOUNTER — Other Ambulatory Visit: Payer: Self-pay

## 2019-02-19 ENCOUNTER — Ambulatory Visit (INDEPENDENT_AMBULATORY_CARE_PROVIDER_SITE_OTHER): Payer: Medicare Other | Admitting: Nurse Practitioner

## 2019-02-19 ENCOUNTER — Ambulatory Visit (INDEPENDENT_AMBULATORY_CARE_PROVIDER_SITE_OTHER): Payer: Medicare Other

## 2019-02-19 ENCOUNTER — Other Ambulatory Visit: Payer: Self-pay

## 2019-02-19 ENCOUNTER — Encounter: Payer: Self-pay | Admitting: Nurse Practitioner

## 2019-02-19 VITALS — BP 138/66 | HR 77 | Temp 97.8°F | Resp 20 | Ht 65.0 in | Wt 130.0 lb

## 2019-02-19 DIAGNOSIS — M810 Age-related osteoporosis without current pathological fracture: Secondary | ICD-10-CM

## 2019-02-19 DIAGNOSIS — F411 Generalized anxiety disorder: Secondary | ICD-10-CM

## 2019-02-19 DIAGNOSIS — I10 Essential (primary) hypertension: Secondary | ICD-10-CM | POA: Diagnosis not present

## 2019-02-19 DIAGNOSIS — M85852 Other specified disorders of bone density and structure, left thigh: Secondary | ICD-10-CM | POA: Diagnosis not present

## 2019-02-19 DIAGNOSIS — Z780 Asymptomatic menopausal state: Secondary | ICD-10-CM | POA: Diagnosis not present

## 2019-02-19 DIAGNOSIS — E782 Mixed hyperlipidemia: Secondary | ICD-10-CM

## 2019-02-19 MED ORDER — LISINOPRIL-HYDROCHLOROTHIAZIDE 10-12.5 MG PO TABS: 1.0000 | ORAL_TABLET | Freq: Every day | ORAL | 1 refills | Status: DC

## 2019-02-19 MED ORDER — ATORVASTATIN CALCIUM 20 MG PO TABS: 20.0000 mg | ORAL_TABLET | Freq: Every day | ORAL | 1 refills | Status: DC

## 2019-02-19 NOTE — Patient Instructions (Signed)

## 2019-02-19 NOTE — Progress Notes (Signed)
Subjective:    Patient ID: Laura Acevedo, female    DOB: September 30, 1932, 86 y.o.   MRN: [id number]   Chief Complaint: Medical Management of Chronic Issues    HPI:  1. Essential hypertension, benign No c/o chest pain, sob or headache. Does not check blood pressure at home. BP Readings from Last 3 Encounters:  02/19/19 138/66  10/05/18 132/70  08/18/18 132/70     2. Mixed hyperlipidemia Does watch diet and does very little exercise. Lab Results  Component Value Date   CHOL 163 08/18/2018   HDL 85 08/18/2018   LDLCALC 62 08/18/2018   TRIG 78 08/18/2018   CHOLHDL 1.9 08/18/2018     3. GAD (generalized anxiety disorder) Stays anxious. Is a chronic worrier.  GAD 7 : Generalized Anxiety Score 02/19/2019 04/19/2017  Nervous, Anxious, on Edge 0 3  Control/stop worrying 1 3  Worry too much - different things 1 3  Trouble relaxing 0 3  Restless 0 2  Easily annoyed or irritable 0 1  Afraid - awful might happen 1 3  Total GAD 7 Score 3 18  Anxiety Difficulty Not difficult at all Very difficult       4. Age-related osteoporosis without current pathological fracture does no weight bearing exercises. Has aged out of dexascans    Outpatient Encounter Medications as of 02/19/2019  Medication Sig  . aspirin EC 81 MG tablet Take 81 mg by mouth daily.  Marland Kitchen atorvastatin (LIPITOR) 20 MG tablet TAKE 1 TABLET BY MOUTH  DAILY  . calcium carbonate (OS-CAL) 600 MG TABS Take 600 mg by mouth daily. AT LUNCH  . cholecalciferol (VITAMIN D) 1000 UNITS tablet Take 1,000 Units by mouth daily.   Marland Kitchen denosumab (PROLIA) 60 MG/ML SOLN injection Inject 60 mg into the skin every 6 (six) months. Administer in upper arm, thigh, or abdomen  . lisinopril-hydrochlorothiazide (ZESTORETIC) 10-12.5 MG tablet TAKE 1 TABLET BY MOUTH  DAILY  . Multiple Vitamins-Minerals (CENTRUM SILVER PO) Take 1 tablet by mouth every morning.  . Multiple Vitamins-Minerals (PRESERVISION AREDS 2 PO) Take by mouth 2 (two) times daily.      Past Surgical History:  Procedure Laterality Date  . CATARACT EXTRACTION    . FOOT NEUROMA SURGERY Left 2005   Dr Irving Shows  . MELANOMA EXCISION Left    leg    Family History  Problem Relation Age of Onset  . Diabetes Mother   . Heart disease Mother   . Heart disease Father   . Heart attack Father   . Kidney disease Father   . Hypertension Sister   . Diabetes Brother   . Hypertension Brother   . Cancer Brother        bladder   . Diabetes Brother   . Diabetes Brother   . Colon cancer Neg Hx     New complaints: None today  Social history: Lives with husband  Controlled substance contract: n/a    Review of Systems  Constitutional: Negative for diaphoresis.  Eyes: Negative for pain.  Respiratory: Negative for shortness of breath.   Cardiovascular: Negative for chest pain, palpitations and leg swelling.  Gastrointestinal: Negative for abdominal pain.  Endocrine: Negative for polydipsia.  Skin: Negative for rash.  Neurological: Negative for dizziness, weakness and headaches.  Hematological: Does not bruise/bleed easily.  All other systems reviewed and are negative.      Objective:   Physical Exam Vitals and nursing note reviewed.  Constitutional:      General: She is not  in acute distress.    Appearance: Normal appearance. She is well-developed.  HENT:     Head: Normocephalic.     Nose: Nose normal.  Eyes:     Pupils: Pupils are equal, round, and reactive to light.  Neck:     Vascular: No carotid bruit or JVD.  Cardiovascular:     Rate and Rhythm: Normal rate and regular rhythm.     Heart sounds: Normal heart sounds.  Pulmonary:     Effort: Pulmonary effort is normal. No respiratory distress.     Breath sounds: Normal breath sounds. No wheezing or rales.  Chest:     Chest wall: No tenderness.  Abdominal:     General: Bowel sounds are normal. There is no distension or abdominal bruit.     Palpations: Abdomen is soft. There is no hepatomegaly,  splenomegaly, mass or pulsatile mass.     Tenderness: There is no abdominal tenderness.  Musculoskeletal:        General: Normal range of motion.     Cervical back: Normal range of motion and neck supple.  Lymphadenopathy:     Cervical: No cervical adenopathy.  Skin:    General: Skin is warm and dry.  Neurological:     Mental Status: She is alert and oriented to person, place, and time.     Deep Tendon Reflexes: Reflexes are normal and symmetric.  Psychiatric:        Behavior: Behavior normal.        Thought Content: Thought content normal.        Judgment: Judgment normal.     BP 138/66   Pulse 77   Temp 97.8 F (36.6 C) (Temporal)   Resp 20   Ht 5' 5"  (1.651 m)   Wt 130 lb (59 kg)   SpO2 94%   BMI 21.63 kg/m        Assessment & Plan:  Laura Acevedo comes in today with chief complaint of Medical Management of Chronic Issues   Diagnosis and orders addressed:  1. Essential hypertension, benign *low sodium diet** - lisinopril-hydrochlorothiazide (ZESTORETIC) 10-12.5 MG tablet; Take 1 tablet by mouth daily.  Dispense: 90 tablet; Refill: 1 - CMP14+EGFR - CBC with Differential/Platelet  2. Mixed hyperlipidemia Low fat diet - atorvastatin (LIPITOR) 20 MG tablet; Take 1 tablet (20 mg total) by mouth daily.  Dispense: 90 tablet; Refill: 1 - Lipid panel  3. GAD (generalized anxiety disorder) Stress management  4. Age-related osteoporosis without current pathological fracture Weight bearing exercise encouraged Continue daily calcium and vitamin d  - [facility] DEXA   Labs pending Health Maintenance reviewed Diet and exercise encouraged  Follow up plan: 6 months   Domingue-Margaret Hassell Done, FNP

## 2019-02-20 LAB — LIPID PANEL
Chol/HDL Ratio: 1.7 ratio (ref 0.0–4.4)
Cholesterol, Total: 160 mg/dL (ref 100–199)
HDL: 92 mg/dL (ref >39)
LDL Chol Calc (NIH): 54 mg/dL (ref 0–99)
Triglycerides: 75 mg/dL (ref 0–149)
VLDL Cholesterol Cal: 14 mg/dL (ref 5–40)

## 2019-02-20 LAB — CBC WITH DIFFERENTIAL/PLATELET
Basophils Absolute: 0 10*3/uL (ref 0.0–0.2)
Basos: 1 %
EOS (ABSOLUTE): 0.1 10*3/uL (ref 0.0–0.4)
Eos: 1 %
Hematocrit: 39 % (ref 34.0–46.6)
Hemoglobin: 13.6 g/dL (ref 11.1–15.9)
Immature Grans (Abs): 0 10*3/uL (ref 0.0–0.1)
Immature Granulocytes: 0 %
Lymphocytes Absolute: 1.6 10*3/uL (ref 0.7–3.1)
Lymphs: 29 %
MCH: 32.2 pg (ref 26.6–33.0)
MCHC: 34.9 g/dL (ref 31.5–35.7)
MCV: 92 fL (ref 79–97)
Monocytes Absolute: 0.6 10*3/uL (ref 0.1–0.9)
Monocytes: 12 %
Neutrophils Absolute: 3.1 10*3/uL (ref 1.4–7.0)
Neutrophils: 57 %
Platelets: 213 10*3/uL (ref 150–450)
RBC: 4.23 x10E6/uL (ref 3.77–5.28)
RDW: 11.9 % (ref 11.7–15.4)
WBC: 5.4 10*3/uL (ref 3.4–10.8)

## 2019-02-20 LAB — CMP14+EGFR
ALT: 27 IU/L (ref 0–32)
AST: 29 IU/L (ref 0–40)
Albumin/Globulin Ratio: 2 (ref 1.2–2.2)
Albumin: 4.4 g/dL (ref 3.6–4.6)
Alkaline Phosphatase: 52 IU/L (ref 39–117)
BUN/Creatinine Ratio: 25 (ref 12–28)
BUN: 27 mg/dL (ref 8–27)
Bilirubin Total: 0.8 mg/dL (ref 0.0–1.2)
CO2: 26 mmol/L (ref 20–29)
Calcium: 9.8 mg/dL (ref 8.7–10.3)
Chloride: 101 mmol/L (ref 96–106)
Creatinine, Ser: 1.08 mg/dL — ABNORMAL HIGH (ref 0.57–1.00)
GFR calc Af Amer: 54 mL/min/{1.73_m2} — ABNORMAL LOW (ref >59)
GFR calc non Af Amer: 47 mL/min/{1.73_m2} — ABNORMAL LOW (ref >59)
Globulin, Total: 2.2 g/dL (ref 1.5–4.5)
Glucose: 98 mg/dL (ref 65–99)
Potassium: 4.2 mmol/L (ref 3.5–5.2)
Sodium: 141 mmol/L (ref 134–144)
Total Protein: 6.6 g/dL (ref 6.0–8.5)

## 2019-03-26 DIAGNOSIS — D0360 Melanoma in situ of unspecified upper limb, including shoulder: Secondary | ICD-10-CM | POA: Diagnosis not present

## 2019-03-26 DIAGNOSIS — Z1283 Encounter for screening for malignant neoplasm of skin: Secondary | ICD-10-CM | POA: Diagnosis not present

## 2019-03-26 DIAGNOSIS — D225 Melanocytic nevi of trunk: Secondary | ICD-10-CM | POA: Diagnosis not present

## 2019-03-26 DIAGNOSIS — Z85820 Personal history of malignant melanoma of skin: Secondary | ICD-10-CM | POA: Diagnosis not present

## 2019-03-26 DIAGNOSIS — Z08 Encounter for follow-up examination after completed treatment for malignant neoplasm: Secondary | ICD-10-CM | POA: Diagnosis not present

## 2019-03-26 DIAGNOSIS — D0361 Melanoma in situ of right upper limb, including shoulder: Secondary | ICD-10-CM | POA: Diagnosis not present

## 2019-03-26 DIAGNOSIS — L720 Epidermal cyst: Secondary | ICD-10-CM | POA: Diagnosis not present

## 2019-04-16 DIAGNOSIS — D0361 Melanoma in situ of right upper limb, including shoulder: Secondary | ICD-10-CM | POA: Diagnosis not present

## 2019-04-16 DIAGNOSIS — D2261 Melanocytic nevi of right upper limb, including shoulder: Secondary | ICD-10-CM | POA: Diagnosis not present

## 2019-05-03 ENCOUNTER — Telehealth: Payer: Self-pay | Admitting: Nurse Practitioner

## 2019-05-03 NOTE — Chronic Care Management (AMB) (Signed)
  Chronic Care Management   Note  05/03/2019 Name: MARTA BOUIE MRN: [id number] DOB: 03-Apr-1932  [name] is a 86 y.o. year old female who is a primary care patient of Nikelle, Malatesta, Burton. I reached out to [name] by phone today in response to a referral sent by Ms. Trinidad Curet Pettinato's health plan.     Ms. Nordell was given information about Chronic Care Management services today including:  1. CCM service includes personalized support from designated clinical staff supervised by her physician, including individualized plan of care and coordination with other care providers 2. 24/7 contact phone numbers for assistance for urgent and routine care needs. 3. Service will only be billed when office clinical staff spend 20 minutes or more in a month to coordinate care. 4. Only one practitioner may furnish and bill the service in a calendar month. 5. The patient may stop CCM services at any time (effective at the end of the month) by phone call to the office staff. 6. The patient will be responsible for cost sharing (co-pay) of up to 20% of the service fee (after annual deductible is met).  Patient did not agree to enrollment in care management services and does not wish to consider at this time.  Follow up plan: The patient has been provided with contact information for the care management team and has been advised to call with any health related questions or concerns.   [place], [place] [address] Direct Dial: [phone] [email or web address]

## 2019-05-14 DIAGNOSIS — C4361 Malignant melanoma of right upper limb, including shoulder: Secondary | ICD-10-CM | POA: Diagnosis not present

## 2019-05-24 DIAGNOSIS — M79671 Pain in right foot: Secondary | ICD-10-CM | POA: Diagnosis not present

## 2019-05-24 DIAGNOSIS — D2372 Other benign neoplasm of skin of left lower limb, including hip: Secondary | ICD-10-CM | POA: Diagnosis not present

## 2019-05-24 DIAGNOSIS — D2371 Other benign neoplasm of skin of right lower limb, including hip: Secondary | ICD-10-CM | POA: Diagnosis not present

## 2019-06-19 ENCOUNTER — Ambulatory Visit (INDEPENDENT_AMBULATORY_CARE_PROVIDER_SITE_OTHER): Payer: Medicare Other | Admitting: *Deleted

## 2019-06-19 ENCOUNTER — Other Ambulatory Visit: Payer: Self-pay

## 2019-06-19 DIAGNOSIS — M810 Age-related osteoporosis without current pathological fracture: Secondary | ICD-10-CM

## 2019-06-19 MED ORDER — DENOSUMAB 60 MG/ML ~~LOC~~ SOSY
60.0000 mg | PREFILLED_SYRINGE | Freq: Once | SUBCUTANEOUS | Status: AC
  Administered 2019-06-19: 60 mg via SUBCUTANEOUS

## 2019-06-19 NOTE — Progress Notes (Signed)
Patient in today for Prolia injection. 60 mg given SQ in left arm. Patient tolerated well.  

## 2019-06-25 DIAGNOSIS — Z85820 Personal history of malignant melanoma of skin: Secondary | ICD-10-CM | POA: Diagnosis not present

## 2019-06-25 DIAGNOSIS — Z1283 Encounter for screening for malignant neoplasm of skin: Secondary | ICD-10-CM | POA: Diagnosis not present

## 2019-06-25 DIAGNOSIS — D225 Melanocytic nevi of trunk: Secondary | ICD-10-CM | POA: Diagnosis not present

## 2019-06-25 DIAGNOSIS — L820 Inflamed seborrheic keratosis: Secondary | ICD-10-CM | POA: Diagnosis not present

## 2019-06-25 DIAGNOSIS — Z08 Encounter for follow-up examination after completed treatment for malignant neoplasm: Secondary | ICD-10-CM | POA: Diagnosis not present

## 2019-07-08 ENCOUNTER — Other Ambulatory Visit: Payer: Self-pay | Admitting: Nurse Practitioner

## 2019-07-08 DIAGNOSIS — E782 Mixed hyperlipidemia: Secondary | ICD-10-CM

## 2019-07-08 DIAGNOSIS — I10 Essential (primary) hypertension: Secondary | ICD-10-CM

## 2019-08-20 ENCOUNTER — Ambulatory Visit (INDEPENDENT_AMBULATORY_CARE_PROVIDER_SITE_OTHER): Payer: Medicare Other | Admitting: Nurse Practitioner

## 2019-08-20 ENCOUNTER — Encounter: Payer: Self-pay | Admitting: Nurse Practitioner

## 2019-08-20 ENCOUNTER — Other Ambulatory Visit: Payer: Self-pay

## 2019-08-20 VITALS — BP 145/68 | HR 67 | Temp 97.6°F | Resp 20 | Ht 65.0 in | Wt 130.0 lb

## 2019-08-20 DIAGNOSIS — M810 Age-related osteoporosis without current pathological fracture: Secondary | ICD-10-CM | POA: Diagnosis not present

## 2019-08-20 DIAGNOSIS — I10 Essential (primary) hypertension: Secondary | ICD-10-CM

## 2019-08-20 DIAGNOSIS — E782 Mixed hyperlipidemia: Secondary | ICD-10-CM | POA: Diagnosis not present

## 2019-08-20 DIAGNOSIS — F411 Generalized anxiety disorder: Secondary | ICD-10-CM

## 2019-08-20 MED ORDER — LISINOPRIL-HYDROCHLOROTHIAZIDE 10-12.5 MG PO TABS: 1.0000 | ORAL_TABLET | Freq: Every day | ORAL | 1 refills | Status: DC

## 2019-08-20 MED ORDER — ATORVASTATIN CALCIUM 20 MG PO TABS: 20.0000 mg | ORAL_TABLET | Freq: Every day | ORAL | 1 refills | Status: DC

## 2019-08-20 NOTE — Progress Notes (Signed)
Subjective:    Patient ID: Laura Acevedo, female    DOB: 1932-01-31, 86 y.o.   MRN: [id number]   Chief Complaint: Medical Management of Chronic Issues    HPI:  1. Essential hypertension, benign No c/o chest pain, sob or headache. Does not check blood pressure at home. BP Readings from Last 3 Encounters:  08/20/19 (!) 145/68  02/19/19 138/66  10/05/18 132/70     2. Mixed hyperlipidemia Does watch diet and does very little exercise. Lab Results  Component Value Date   CHOL 160 02/19/2019   HDL 92 02/19/2019   LDLCALC 54 02/19/2019   TRIG 75 02/19/2019   CHOLHDL 1.7 02/19/2019     3. GAD (generalized anxiety disorder) Has been doing better- is on no rx med GAD 7 : Generalized Anxiety Score 08/20/2019 02/19/2019 04/19/2017  Nervous, Anxious, on Edge 0 0 3  Control/stop worrying _0 Worry too much - different things _1 Trouble relaxing 0 0 3  Restless 0 0 2  Easily annoyed or irritable 0 0 1  Afraid - awful might happen 0 1 3  Total GAD 7 Score _2 Anxiety Difficulty Not difficult at all Not difficult at all Very difficult        4. Age-related osteoporosis without current pathological fracture Last dexascan was done 02/19/19. t score of -2.1. she doe sno weight bearing exercise.    Outpatient Encounter Medications as of 08/20/2019  Medication Sig  . aspirin EC 81 MG tablet Take 81 mg by mouth daily.  Marland Kitchen atorvastatin (LIPITOR) 20 MG tablet TAKE 1 TABLET BY MOUTH  DAILY  . calcium carbonate (OS-CAL) 600 MG TABS Take 600 mg by mouth daily. AT LUNCH  . cholecalciferol (VITAMIN D) 1000 UNITS tablet Take 1,000 Units by mouth daily.   Marland Kitchen denosumab (PROLIA) 60 MG/ML SOLN injection Inject 60 mg into the skin every 6 (six) months. Administer in upper arm, thigh, or abdomen  . lisinopril-hydrochlorothiazide (ZESTORETIC) 10-12.5 MG tablet TAKE 1 TABLET BY MOUTH  DAILY  . Multiple Vitamins-Minerals (CENTRUM SILVER PO) Take 1 tablet by mouth every morning.  . Multiple  Vitamins-Minerals (PRESERVISION AREDS 2 PO) Take by mouth 2 (two) times daily.     Past Surgical History:  Procedure Laterality Date  . CATARACT EXTRACTION    . FOOT NEUROMA SURGERY Left 2005   Dr Irving Shows  . MELANOMA EXCISION Left    leg    Family History  Problem Relation Age of Onset  . Diabetes Mother   . Heart disease Mother   . Heart disease Father   . Heart attack Father   . Kidney disease Father   . Hypertension Sister   . Diabetes Brother   . Hypertension Brother   . Cancer Brother        bladder   . Diabetes Brother   . Diabetes Brother   . Colon cancer Neg Hx     New complaints: None today  Social history: Takes care of her ailing husband  Controlled substance contract: n/a    Review of Systems  Constitutional: Negative for diaphoresis.  Eyes: Negative for pain.  Respiratory: Negative for shortness of breath.   Cardiovascular: Negative for chest pain, palpitations and leg swelling.  Gastrointestinal: Negative for abdominal pain.  Endocrine: Negative for polydipsia.  Skin: Negative for rash.  Neurological: Negative for dizziness, weakness and headaches.  Hematological: Does not bruise/bleed easily.  All other systems reviewed and are negative.  Objective:   Physical Exam Vitals and nursing note reviewed.  Constitutional:      General: She is not in acute distress.    Appearance: Normal appearance. She is well-developed.  HENT:     Head: Normocephalic.     Nose: Nose normal.  Eyes:     Pupils: Pupils are equal, round, and reactive to light.  Neck:     Vascular: No carotid bruit or JVD.  Cardiovascular:     Rate and Rhythm: Normal rate and regular rhythm.     Heart sounds: Normal heart sounds.  Pulmonary:     Effort: Pulmonary effort is normal. No respiratory distress.     Breath sounds: Normal breath sounds. No wheezing or rales.  Chest:     Chest wall: No tenderness.  Abdominal:     General: Bowel sounds are normal. There is no  distension or abdominal bruit.     Palpations: Abdomen is soft. There is no hepatomegaly, splenomegaly, mass or pulsatile mass.     Tenderness: There is no abdominal tenderness.  Musculoskeletal:        General: Normal range of motion.     Cervical back: Normal range of motion and neck supple.  Lymphadenopathy:     Cervical: No cervical adenopathy.  Skin:    General: Skin is warm and dry.  Neurological:     Mental Status: She is alert and oriented to person, place, and time.     Deep Tendon Reflexes: Reflexes are normal and symmetric.  Psychiatric:        Behavior: Behavior normal.        Thought Content: Thought content normal.        Judgment: Judgment normal.     BP (!) 145/68   Pulse 67   Temp 97.6 F (36.4 C) (Temporal)   Resp 20   Ht _0  (1.651 m)   Wt 130 lb (59 kg)   SpO2 100%   BMI 21.63 kg/m        Assessment & Plan:  Laura Acevedo comes in today with chief complaint of Medical Management of Chronic Issues   Diagnosis and orders addressed:  1. Essential hypertension, benign Low sodium diet - lisinopril-hydrochlorothiazide (ZESTORETIC) 10-12.5 MG tablet; Take 1 tablet by mouth daily.  Dispense: 90 tablet; Refill: 1 - CBC with Differential/Platelet - CMP14+EGFR  2. Mixed hyperlipidemia Low fat diet - atorvastatin (LIPITOR) 20 MG tablet; Take 1 tablet (20 mg total) by mouth daily.  Dispense: 90 tablet; Refill: 1 - Lipid panel  3. GAD (generalized anxiety disorder) Stress management  4. Age-related osteoporosis without current pathological fracture Weight bearing exercises encouraged   Labs pending Health Maintenance reviewed- patient will schedule mammogram Diet and exercise encouraged  Follow up plan: 6 months   [place]-Margaret Hassell Done, FNP

## 2019-08-20 NOTE — Patient Instructions (Signed)

## 2019-08-21 ENCOUNTER — Other Ambulatory Visit: Payer: Self-pay | Admitting: Nurse Practitioner

## 2019-08-21 DIAGNOSIS — Z1231 Encounter for screening mammogram for malignant neoplasm of breast: Secondary | ICD-10-CM

## 2019-08-21 LAB — LIPID PANEL
Chol/HDL Ratio: 1.8 ratio (ref 0.0–4.4)
Cholesterol, Total: 166 mg/dL (ref 100–199)
HDL: 94 mg/dL (ref >39)
LDL Chol Calc (NIH): 58 mg/dL (ref 0–99)
Triglycerides: 76 mg/dL (ref 0–149)
VLDL Cholesterol Cal: 14 mg/dL (ref 5–40)

## 2019-08-21 LAB — CBC WITH DIFFERENTIAL/PLATELET
Basophils Absolute: 0.1 10*3/uL (ref 0.0–0.2)
Basos: 1 %
EOS (ABSOLUTE): 0.1 10*3/uL (ref 0.0–0.4)
Eos: 1 %
Hematocrit: 38.4 % (ref 34.0–46.6)
Hemoglobin: 13.2 g/dL (ref 11.1–15.9)
Immature Grans (Abs): 0 10*3/uL (ref 0.0–0.1)
Immature Granulocytes: 0 %
Lymphocytes Absolute: 1.2 10*3/uL (ref 0.7–3.1)
Lymphs: 22 %
MCH: 33 pg (ref 26.6–33.0)
MCHC: 34.4 g/dL (ref 31.5–35.7)
MCV: 96 fL (ref 79–97)
Monocytes Absolute: 0.6 10*3/uL (ref 0.1–0.9)
Monocytes: 10 %
Neutrophils Absolute: 3.8 10*3/uL (ref 1.4–7.0)
Neutrophils: 66 %
Platelets: 205 10*3/uL (ref 150–450)
RBC: 4 x10E6/uL (ref 3.77–5.28)
RDW: 11.9 % (ref 11.7–15.4)
WBC: 5.7 10*3/uL (ref 3.4–10.8)

## 2019-08-21 LAB — CMP14+EGFR
ALT: 27 IU/L (ref 0–32)
AST: 31 IU/L (ref 0–40)
Albumin/Globulin Ratio: 1.8 (ref 1.2–2.2)
Albumin: 4.4 g/dL (ref 3.6–4.6)
Alkaline Phosphatase: 53 IU/L (ref 48–121)
BUN/Creatinine Ratio: 21 (ref 12–28)
BUN: 23 mg/dL (ref 8–27)
Bilirubin Total: 0.9 mg/dL (ref 0.0–1.2)
CO2: 25 mmol/L (ref 20–29)
Calcium: 9.8 mg/dL (ref 8.7–10.3)
Chloride: 103 mmol/L (ref 96–106)
Creatinine, Ser: 1.08 mg/dL — ABNORMAL HIGH (ref 0.57–1.00)
GFR calc Af Amer: 54 mL/min/{1.73_m2} — ABNORMAL LOW (ref >59)
GFR calc non Af Amer: 47 mL/min/{1.73_m2} — ABNORMAL LOW (ref >59)
Globulin, Total: 2.4 g/dL (ref 1.5–4.5)
Glucose: 93 mg/dL (ref 65–99)
Potassium: 4.2 mmol/L (ref 3.5–5.2)
Sodium: 142 mmol/L (ref 134–144)
Total Protein: 6.8 g/dL (ref 6.0–8.5)

## 2019-09-25 DIAGNOSIS — D2371 Other benign neoplasm of skin of right lower limb, including hip: Secondary | ICD-10-CM | POA: Diagnosis not present

## 2019-09-25 DIAGNOSIS — M79671 Pain in right foot: Secondary | ICD-10-CM | POA: Diagnosis not present

## 2019-09-25 DIAGNOSIS — D2372 Other benign neoplasm of skin of left lower limb, including hip: Secondary | ICD-10-CM | POA: Diagnosis not present

## 2019-09-27 DIAGNOSIS — D225 Melanocytic nevi of trunk: Secondary | ICD-10-CM | POA: Diagnosis not present

## 2019-09-27 DIAGNOSIS — Z1283 Encounter for screening for malignant neoplasm of skin: Secondary | ICD-10-CM | POA: Diagnosis not present

## 2019-09-27 DIAGNOSIS — Z08 Encounter for follow-up examination after completed treatment for malignant neoplasm: Secondary | ICD-10-CM | POA: Diagnosis not present

## 2019-09-27 DIAGNOSIS — Z85820 Personal history of malignant melanoma of skin: Secondary | ICD-10-CM | POA: Diagnosis not present

## 2019-09-27 DIAGNOSIS — D2272 Melanocytic nevi of left lower limb, including hip: Secondary | ICD-10-CM | POA: Diagnosis not present

## 2019-09-27 DIAGNOSIS — D485 Neoplasm of uncertain behavior of skin: Secondary | ICD-10-CM | POA: Diagnosis not present

## 2019-10-11 DIAGNOSIS — D485 Neoplasm of uncertain behavior of skin: Secondary | ICD-10-CM | POA: Diagnosis not present

## 2019-10-11 DIAGNOSIS — L98499 Non-pressure chronic ulcer of skin of other sites with unspecified severity: Secondary | ICD-10-CM | POA: Diagnosis not present

## 2019-11-15 ENCOUNTER — Ambulatory Visit (INDEPENDENT_AMBULATORY_CARE_PROVIDER_SITE_OTHER): Payer: Medicare Other

## 2019-11-15 ENCOUNTER — Other Ambulatory Visit: Payer: Self-pay

## 2019-11-15 DIAGNOSIS — Z23 Encounter for immunization: Secondary | ICD-10-CM | POA: Diagnosis not present

## 2019-11-15 DIAGNOSIS — H353131 Nonexudative age-related macular degeneration, bilateral, early dry stage: Secondary | ICD-10-CM | POA: Diagnosis not present

## 2019-11-15 DIAGNOSIS — H35371 Puckering of macula, right eye: Secondary | ICD-10-CM | POA: Diagnosis not present

## 2019-11-16 ENCOUNTER — Ambulatory Visit
Admission: RE | Admit: 2019-11-16 | Discharge: 2019-11-16 | Disposition: A | Discharge status: Home / Self Care | Payer: Medicare Other | Source: Ambulatory Visit | Attending: Nurse Practitioner | Admitting: Nurse Practitioner

## 2019-11-16 DIAGNOSIS — Z1231 Encounter for screening mammogram for malignant neoplasm of breast: Secondary | ICD-10-CM

## 2019-12-25 ENCOUNTER — Ambulatory Visit: Payer: Medicare Other

## 2019-12-25 ENCOUNTER — Other Ambulatory Visit: Payer: Self-pay

## 2019-12-27 DIAGNOSIS — Z1283 Encounter for screening for malignant neoplasm of skin: Secondary | ICD-10-CM | POA: Diagnosis not present

## 2019-12-27 DIAGNOSIS — D225 Melanocytic nevi of trunk: Secondary | ICD-10-CM | POA: Diagnosis not present

## 2019-12-27 DIAGNOSIS — Z85820 Personal history of malignant melanoma of skin: Secondary | ICD-10-CM | POA: Diagnosis not present

## 2019-12-27 DIAGNOSIS — Z08 Encounter for follow-up examination after completed treatment for malignant neoplasm: Secondary | ICD-10-CM | POA: Diagnosis not present

## 2020-01-15 ENCOUNTER — Telehealth: Payer: Self-pay

## 2020-01-15 NOTE — Telephone Encounter (Signed)
Appt scheduled for tomorrow. Left detailed message on patients machine about appt

## 2020-01-16 ENCOUNTER — Ambulatory Visit (INDEPENDENT_AMBULATORY_CARE_PROVIDER_SITE_OTHER): Payer: Medicare Other

## 2020-01-16 ENCOUNTER — Other Ambulatory Visit: Payer: Self-pay

## 2020-01-16 DIAGNOSIS — M810 Age-related osteoporosis without current pathological fracture: Secondary | ICD-10-CM

## 2020-01-16 MED ORDER — DENOSUMAB 60 MG/ML ~~LOC~~ SOSY
60.0000 mg | PREFILLED_SYRINGE | Freq: Once | SUBCUTANEOUS | Status: AC
  Administered 2020-01-16: 16:00:00 60 mg via SUBCUTANEOUS

## 2020-01-16 NOTE — Progress Notes (Signed)
Prolia injection given and tolerated well

## 2020-02-18 ENCOUNTER — Other Ambulatory Visit: Payer: Self-pay | Admitting: Nurse Practitioner

## 2020-02-18 DIAGNOSIS — I10 Essential (primary) hypertension: Secondary | ICD-10-CM

## 2020-02-18 DIAGNOSIS — E782 Mixed hyperlipidemia: Secondary | ICD-10-CM

## 2020-02-21 ENCOUNTER — Other Ambulatory Visit: Payer: Self-pay

## 2020-02-21 ENCOUNTER — Encounter: Payer: Self-pay | Admitting: Nurse Practitioner

## 2020-02-21 ENCOUNTER — Ambulatory Visit (INDEPENDENT_AMBULATORY_CARE_PROVIDER_SITE_OTHER): Payer: Medicare Other | Admitting: Nurse Practitioner

## 2020-02-21 VITALS — BP 146/78 | HR 75 | Temp 97.9°F | Ht 65.0 in | Wt 131.8 lb

## 2020-02-21 DIAGNOSIS — I10 Essential (primary) hypertension: Secondary | ICD-10-CM

## 2020-02-21 DIAGNOSIS — E782 Mixed hyperlipidemia: Secondary | ICD-10-CM

## 2020-02-21 DIAGNOSIS — M810 Age-related osteoporosis without current pathological fracture: Secondary | ICD-10-CM | POA: Diagnosis not present

## 2020-02-21 DIAGNOSIS — F411 Generalized anxiety disorder: Secondary | ICD-10-CM

## 2020-02-21 LAB — CMP14+EGFR
ALT: 24 IU/L (ref 0–32)
AST: 26 IU/L (ref 0–40)
Albumin/Globulin Ratio: 1.7 (ref 1.2–2.2)
Albumin: 4.5 g/dL (ref 3.6–4.6)
Alkaline Phosphatase: 56 IU/L (ref 44–121)
BUN/Creatinine Ratio: 22 (ref 12–28)
BUN: 26 mg/dL (ref 8–27)
Bilirubin Total: 1 mg/dL (ref 0.0–1.2)
CO2: 24 mmol/L (ref 20–29)
Calcium: 9.9 mg/dL (ref 8.7–10.3)
Chloride: 99 mmol/L (ref 96–106)
Creatinine, Ser: 1.16 mg/dL — ABNORMAL HIGH (ref 0.57–1.00)
GFR calc Af Amer: 50 mL/min/{1.73_m2} — ABNORMAL LOW (ref >59)
GFR calc non Af Amer: 43 mL/min/{1.73_m2} — ABNORMAL LOW (ref >59)
Globulin, Total: 2.6 g/dL (ref 1.5–4.5)
Glucose: 95 mg/dL (ref 65–99)
Potassium: 5 mmol/L (ref 3.5–5.2)
Sodium: 139 mmol/L (ref 134–144)
Total Protein: 7.1 g/dL (ref 6.0–8.5)

## 2020-02-21 LAB — LIPID PANEL
Chol/HDL Ratio: 2 ratio (ref 0.0–4.4)
Cholesterol, Total: 204 mg/dL — ABNORMAL HIGH (ref 100–199)
HDL: 101 mg/dL (ref >39)
LDL Chol Calc (NIH): 90 mg/dL (ref 0–99)
Triglycerides: 75 mg/dL (ref 0–149)
VLDL Cholesterol Cal: 13 mg/dL (ref 5–40)

## 2020-02-21 LAB — CBC WITH DIFFERENTIAL/PLATELET
Basophils Absolute: 0.1 10*3/uL (ref 0.0–0.2)
Basos: 1 %
EOS (ABSOLUTE): 0 10*3/uL (ref 0.0–0.4)
Eos: 1 %
Hematocrit: 40.4 % (ref 34.0–46.6)
Hemoglobin: 13.6 g/dL (ref 11.1–15.9)
Immature Grans (Abs): 0 10*3/uL (ref 0.0–0.1)
Immature Granulocytes: 0 %
Lymphocytes Absolute: 1.6 10*3/uL (ref 0.7–3.1)
Lymphs: 28 %
MCH: 31.6 pg (ref 26.6–33.0)
MCHC: 33.7 g/dL (ref 31.5–35.7)
MCV: 94 fL (ref 79–97)
Monocytes Absolute: 0.7 10*3/uL (ref 0.1–0.9)
Monocytes: 13 %
Neutrophils Absolute: 3.2 10*3/uL (ref 1.4–7.0)
Neutrophils: 57 %
Platelets: 230 10*3/uL (ref 150–450)
RBC: 4.31 x10E6/uL (ref 3.77–5.28)
RDW: 11.6 % — ABNORMAL LOW (ref 11.7–15.4)
WBC: 5.6 10*3/uL (ref 3.4–10.8)

## 2020-02-21 MED ORDER — ATORVASTATIN CALCIUM 20 MG PO TABS: 20.0000 mg | ORAL_TABLET | Freq: Every day | ORAL | 1 refills | Status: DC

## 2020-02-21 MED ORDER — LISINOPRIL-HYDROCHLOROTHIAZIDE 10-12.5 MG PO TABS: 1.0000 | ORAL_TABLET | Freq: Every day | ORAL | 1 refills | Status: DC

## 2020-02-21 NOTE — Progress Notes (Signed)
Subjective:    Patient ID: Laura Acevedo, female    DOB: [birth year], 87 y.o.   MRN: [id number]   Chief Complaint: medical management of chronic issues     HPI:  1. Essential hypertension, benign No c/o chest pain, sob or headache. Does not check blood pressure at home. BP Readings from Last 3 Encounters:  02/21/20 (!) 146/78  08/20/19 (!) 145/68  02/19/19 138/66      2. Mixed hyperlipidemia Does try to watch diet but does no dedicated exercise. She says she does try to stay very active and busy. Lab Results  Component Value Date   CHOL 166 08/20/2019   HDL 94 08/20/2019   LDLCALC 58 08/20/2019   TRIG 76 08/20/2019   CHOLHDL 1.8 08/20/2019     3. GAD (generalized anxiety disorder) She stays anxious but is currently not taking anything. GAD 7 : Generalized Anxiety Score 02/21/2020 08/20/2019 02/19/2019 04/19/2017  Nervous, Anxious, on Edge 0 0 0 3  Control/stop worrying _0 Worry too much - different things 0 _1 Trouble relaxing 0 0 0 3  Restless 0 0 0 2  Easily annoyed or irritable 0 0 0 1  Afraid - awful might happen 1 0 1 3  Total GAD 7 Score _2 Anxiety Difficulty Somewhat difficult Not difficult at all Not difficult at all Very difficult     4. Age-related osteoporosis without current pathological fracture Last dexascan was done 02/19/19. Her t score was -2.1. she is currently on prolia.     Outpatient Encounter Medications as of 02/21/2020  Medication Sig  . aspirin EC 81 MG tablet Take 81 mg by mouth daily.  Marland Kitchen atorvastatin (LIPITOR) 20 MG tablet TAKE 1 TABLET BY MOUTH  DAILY  . calcium carbonate (OS-CAL) 600 MG TABS Take 600 mg by mouth daily. AT LUNCH  . cholecalciferol (VITAMIN D) 1000 UNITS tablet Take 1,000 Units by mouth daily.   Marland Kitchen denosumab (PROLIA) 60 MG/ML SOLN injection Inject 60 mg into the skin every 6 (six) months. Administer in upper arm, thigh, or abdomen  . lisinopril-hydrochlorothiazide (ZESTORETIC) 10-12.5 MG tablet TAKE 1 TABLET BY  MOUTH  DAILY  . Multiple Vitamins-Minerals (CENTRUM SILVER PO) Take 1 tablet by mouth every morning.  . Multiple Vitamins-Minerals (PRESERVISION AREDS 2 PO) Take by mouth 2 (two) times daily.    Past Surgical History:  Procedure Laterality Date  . CATARACT EXTRACTION    . FOOT NEUROMA SURGERY Left 2005   Dr Irving Shows  . MELANOMA EXCISION Left    leg    Family History  Problem Relation Age of Onset  . Diabetes Mother   . Heart disease Mother   . Heart disease Father   . Heart attack Father   . Kidney disease Father   . Hypertension Sister   . Diabetes Brother   . Hypertension Brother   . Cancer Brother        bladder   . Diabetes Brother   . Diabetes Brother   . Colon cancer Neg Hx     New complaints: None today  Social history: Lives with her ailing husband  Controlled substance contract: n/a    Review of Systems  Constitutional: Negative for diaphoresis.  Eyes: Negative for pain.  Respiratory: Negative for shortness of breath.   Cardiovascular: Negative for chest pain, palpitations and leg swelling.  Gastrointestinal: Negative for abdominal pain.  Endocrine: Negative for polydipsia.  Skin: Negative for rash.  Neurological: Negative for dizziness, weakness and headaches.  Hematological: Does not bruise/bleed easily.  All other systems reviewed and are negative.      Objective:   Physical Exam Vitals and nursing note reviewed.  Constitutional:      General: She is not in acute distress.    Appearance: Normal appearance. She is well-developed and well-nourished.  HENT:     Head: Normocephalic.     Nose: Nose normal.     Mouth/Throat:     Mouth: Oropharynx is clear and moist.  Eyes:     Extraocular Movements: EOM normal.     Pupils: Pupils are equal, round, and reactive to light.  Neck:     Vascular: No carotid bruit or JVD.  Cardiovascular:     Rate and Rhythm: Normal rate and regular rhythm.     Pulses: Intact distal pulses.     Heart sounds:  Normal heart sounds.     Comments: Varicose veins bil lower ext. Pulmonary:     Effort: Pulmonary effort is normal. No respiratory distress.     Breath sounds: Normal breath sounds. No wheezing or rales.  Chest:     Chest wall: No tenderness.  Abdominal:     General: Bowel sounds are normal. There is no distension or abdominal bruit. Aorta is normal.     Palpations: Abdomen is soft. There is no hepatomegaly, splenomegaly, mass or pulsatile mass.     Tenderness: There is no abdominal tenderness.  Musculoskeletal:        General: No edema. Normal range of motion.     Cervical back: Normal range of motion and neck supple.  Lymphadenopathy:     Cervical: No cervical adenopathy.  Skin:    General: Skin is warm and dry.  Neurological:     Mental Status: She is alert and oriented to person, place, and time.     Deep Tendon Reflexes: Reflexes are normal and symmetric.  Psychiatric:        Mood and Affect: Mood and affect normal.        Behavior: Behavior normal.        Thought Content: Thought content normal.        Judgment: Judgment normal.    BP (!) 146/78   Pulse 75   Temp 97.9 F (36.6 C) (Temporal)   Ht _0  (1.651 m)   Wt 131 lb 12.8 oz (59.8 kg)   BMI 21.93 kg/m         Assessment & Plan:  Laura Acevedo comes in today with chief complaint of Hypertension, Anxiety, Hyperlipidemia (/), and Osteoporosis   Diagnosis and orders addressed:  1. Essential hypertension, benign Low sodium diet - lisinopril-hydrochlorothiazide (ZESTORETIC) 10-12.5 MG tablet; Take 1 tablet by mouth daily.  Dispense: 90 tablet; Refill: 1 - CBC with Differential/Platelet - CMP14+EGFR  2. Mixed hyperlipidemia Low fat diet - atorvastatin (LIPITOR) 20 MG tablet; Take 1 tablet (20 mg total) by mouth daily.  Dispense: 90 tablet; Refill: 1 - Lipid panel  3. GAD (generalized anxiety disorder) Stress management  4. Age-related osteoporosis without current pathological fracture Weight bearing  exercise Daily vitamin d and calcium supplement Continue prolia every 6 months   Labs pending Health Maintenance reviewed Diet and exercise encouraged  Follow up plan: 6 months   [place]-Margaret Hassell Done, FNP

## 2020-02-21 NOTE — Patient Instructions (Signed)
Fall Prevention in the Home, Adult Falls can cause injuries and can happen to people of all ages. There are many things you can do to make your home safe and to help prevent falls. Ask for help when making these changes. What actions can I take to prevent falls? General Instructions  Use good lighting in all rooms. Replace any light bulbs that burn out.  Turn on the lights in dark areas. Use night-lights.  Keep items that you use often in easy-to-reach places. Lower the shelves around your home if needed.  Set up your furniture so you have a clear path. Avoid moving your furniture around.  Do not have throw rugs or other things on the floor that can make you trip.  Avoid walking on wet floors.  If any of your floors are uneven, fix them.  Add color or contrast paint or tape to clearly mark and help you see: ? Grab bars or handrails. ? First and last steps of staircases. ? Where the edge of each step is.  If you use a stepladder: ? Make sure that it is fully opened. Do not climb a closed stepladder. ? Make sure the sides of the stepladder are locked in place. ? Ask someone to hold the stepladder while you use it.  Know where your pets are when moving through your home. What can I do in the bathroom?  Keep the floor dry. Clean up any water on the floor right away.  Remove soap buildup in the tub or shower.  Use nonskid mats or decals on the floor of the tub or shower.  Attach bath mats securely with double-sided, nonslip rug tape.  If you need to sit down in the shower, use a plastic, nonslip stool.  Install grab bars by the toilet and in the tub and shower. Do not use towel bars as grab bars.      What can I do in the bedroom?  Make sure that you have a light by your bed that is easy to reach.  Do not use any sheets or blankets for your bed that hang to the floor.  Have a firm chair with side arms that you can use for support when you get dressed. What can I do in  the kitchen?  Clean up any spills right away.  If you need to reach something above you, use a step stool with a grab bar.  Keep electrical cords out of the way.  Do not use floor polish or wax that makes floors slippery. What can I do with my stairs?  Do not leave any items on the stairs.  Make sure that you have a light switch at the top and the bottom of the stairs.  Make sure that there are handrails on both sides of the stairs. Fix handrails that are broken or loose.  Install nonslip stair treads on all your stairs.  Avoid having throw rugs at the top or bottom of the stairs.  Choose a carpet that does not hide the edge of the steps on the stairs.  Check carpeting to make sure that it is firmly attached to the stairs. Fix carpet that is loose or worn. What can I do on the outside of my home?  Use bright outdoor lighting.  Fix the edges of walkways and driveways and fix any cracks.  Remove anything that might make you trip as you walk through a door, such as a raised step or threshold.  Trim any   bushes or trees on paths to your home.  Check to see if handrails are loose or broken and that both sides of all steps have handrails.  Install guardrails along the edges of any raised decks and porches.  Clear paths of anything that can make you trip, such as tools or rocks.  Have leaves, snow, or ice cleared regularly.  Use sand or salt on paths during winter.  Clean up any spills in your garage right away. This includes grease or oil spills. What other actions can I take?  Wear shoes that: ? Have a low heel. Do not wear high heels. ? Have rubber bottoms. ? Feel good on your feet and fit well. ? Are closed at the toe. Do not wear open-toe sandals.  Use tools that help you move around if needed. These include: ? Canes. ? Walkers. ? Scooters. ? Crutches.  Review your medicines with your doctor. Some medicines can make you feel dizzy. This can increase your chance  of falling. Ask your doctor what else you can do to help prevent falls. Where to find more information  Centers for Disease Control and Prevention, [employer]: www.cdc.gov  National Institute on Aging: www.nia.nih.gov Contact a doctor if:  You are afraid of falling at home.  You feel weak, drowsy, or dizzy at home.  You fall at home. Summary  There are many simple things that you can do to make your home safe and to help prevent falls.  Ways to make your home safe include removing things that can make you trip and installing grab bars in the bathroom.  Ask for help when making these changes in your home. This information is not intended to replace advice given to you by your health care provider. Make sure you discuss any questions you have with your health care provider. Document Revised: 08/08/2019 Document Reviewed: 08/08/2019 [employer] Patient Education  2021 [employer]  

## 2020-02-26 ENCOUNTER — Ambulatory Visit (INDEPENDENT_AMBULATORY_CARE_PROVIDER_SITE_OTHER): Payer: Medicare Other | Admitting: *Deleted

## 2020-02-26 DIAGNOSIS — Z0000 Encounter for general adult medical examination without abnormal findings: Secondary | ICD-10-CM | POA: Diagnosis not present

## 2020-02-26 NOTE — Progress Notes (Signed)
MEDICARE ANNUAL WELLNESS VISIT  02/26/2020  Telephone Visit Disclaimer This Medicare AWV was conducted by telephone due to national recommendations for restrictions regarding the COVID-19 Pandemic (e.g. social distancing).  I verified, using two identifiers, that I am speaking with Laura Acevedo or their authorized healthcare agent. I discussed the limitations, risks, security, and privacy concerns of performing an evaluation and management service by telephone and the potential availability of an in-person appointment in the future. The patient expressed understanding and agreed to proceed.  Location of Patient: home Location of Provider (nurse):  office  Subjective:    Laura Acevedo is a 87 y.o. female patient of [facility], [facility], Cook who had a Medicare Annual Wellness Visit today via telephone. Laura Acevedo is Retired and lives with their spouse. she has 0 children. she reports that she is socially active and does interact with friends/family regularly. she is minimally physically active and enjoys sewing, staying active in [place], doing word puzzles, gardening, working in her flowers and playing the piano.  Patient Care Team: Chevis Pretty, FNP as PCP - General (Nurse Practitioner) Calvert Cantor, MD as Consulting Physician (Ophthalmology) Druscilla Brownie, MD as Consulting Physician (Dermatology) Inda Castle, MD (Inactive) as Consulting Physician (Gastroenterology)  Advanced Directives 02/26/2020 10/03/2018 09/29/2017 08/28/2015 08/23/2014 07/31/2014 02/12/2014  Does Patient Have a Medical Advance Directive? No No No Yes No Yes No  Type of Advance Directive - - Public librarian;Living will - - -  Does patient want to make changes to medical advance directive? - - - No - Patient declined - Yes - information given -  Copy of Thomas in Chart? - - - Yes - - -  Would patient like information on creating a medical advance directive? No - Patient declined  No - Patient declined No - Patient declined - Yes - Scientist, clinical (histocompatibility and immunogenetics) given - Yes - Educational materials given    Hospital Utilization Over the Past 12 Months: # of hospitalizations or ER visits: 0 # of surgeries: 0  Review of Systems    Patient reports that her overall health is unchanged compared to last year.  History obtained from chart review and the patient  Patient Reported Readings (BP, Pulse, CBG, Weight, etc) none  Pain Assessment Pain : No/denies pain     Current Medications & Allergies (verified) Allergies as of 02/26/2020   No Known Allergies     Medication List       Accurate as of February 26, 2020  1:34 PM. If you have any questions, ask your nurse or doctor.        aspirin EC 81 MG tablet Take 81 mg by mouth daily.   atorvastatin 20 MG tablet Commonly known as: LIPITOR Take 1 tablet (20 mg total) by mouth daily.   calcium carbonate 600 MG Tabs tablet Commonly known as: OS-CAL Take 600 mg by mouth daily. AT LUNCH   CENTRUM SILVER PO Take 1 tablet by mouth every morning.   PRESERVISION AREDS 2 PO Take by mouth 2 (two) times daily.   cholecalciferol 1000 units tablet Commonly known as: VITAMIN D Take 1,000 Units by mouth daily.   denosumab 60 MG/ML Soln injection Commonly known as: PROLIA Inject 60 mg into the skin every 6 (six) months. Administer in upper arm, thigh, or abdomen   lisinopril-hydrochlorothiazide 10-12.5 MG tablet Commonly known as: ZESTORETIC Take 1 tablet by mouth daily.       History (reviewed): Past Medical History:  Diagnosis Date  . Cancer ([place]) 1980   melanoma  . Cataract   . Colon polyps   . 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 Hyperlipidemia   . Hypertension   . Osteoporosis    Past Surgical History:  Procedure Laterality Date  . CATARACT EXTRACTION    . FOOT NEUROMA SURGERY Left 2005   Dr Irving Shows  . MELANOMA EXCISION Left    leg   Family History  Problem Relation Age of Onset  . Diabetes Mother   .  Heart disease Mother   . Heart disease Father   . Heart attack Father   . Kidney disease Father   . Hypertension Sister   . Diabetes Brother   . Hypertension Brother   . Cancer Brother        bladder   . Diabetes Brother   . Diabetes Brother   . Colon cancer Neg Hx    Social History   Socioeconomic History  . Marital status: Married    Spouse name: [name]  . Number of children: 0  . Years of education: 28  . Highest education level: 12th grade  Occupational History  . Occupation: retired    Comment: Charity fundraiser  Tobacco Use  . Smoking status: Never Smoker  . Smokeless tobacco: Never Used  Vaping Use  . Vaping Use: Never used  Substance and Sexual Activity  . Alcohol use: No  . Drug use: No  . Sexual activity: Not Currently  Other Topics Concern  . Not on file  Social History Narrative  . Not on file   Social Determinants of Health   Financial Resource Strain: Low Risk   . Difficulty of Paying Living Expenses: Not hard at all  Food Insecurity: No Food Insecurity  . Worried About Charity fundraiser in the Last Year: Never true  . Ran Out of Food in the Last Year: Never true  Transportation Needs: No Transportation Needs  . Lack of Transportation (Medical): No  . Lack of Transportation (Non-Medical): No  Physical Activity: Inactive  . Days of Exercise per Week: 0 days  . Minutes of Exercise per Session: 0 min  Stress: No Stress Concern Present  . Feeling of Stress : Not at all  Social Connections: Socially Integrated  . Frequency of Communication with Friends and Family: More than three times a week  . Frequency of Social Gatherings with Friends and Family: More than three times a week  . Attends Religious Services: More than 4 times per year  . Active Member of Clubs or Organizations: Yes  . Attends Archivist Meetings: More than 4 times per year  . Marital Status: Married    Activities of Daily Living In your present state of health, do you have any  difficulty performing the following activities: 02/26/2020  Hearing? N  Vision? N  Difficulty concentrating or making decisions? N  Walking or climbing stairs? N  Dressing or bathing? N  Doing errands, shopping? N  Preparing Food and eating ? N  Using the Toilet? N  In the past six months, have you accidently leaked urine? Y  Comment wears pantyliners at all times  Do you have problems with loss of bowel control? N  Managing your Medications? N  Managing your Finances? N  Housekeeping or managing your Housekeeping? N  Some recent data might be hidden    Patient Education/ Literacy How often do you need to have someone help you when you read instructions, pamphlets,  or other written materials from your doctor or pharmacy?: 1 - Never What is the last grade level you completed in school?: 12th Grade  Exercise Current Exercise Habits: The patient does not participate in regular exercise at present, Exercise limited by: None identified  Diet Patient reports consuming 3 meals a day and 1 snack(s) a day Patient reports that her primary diet is: Regular Patient reports that she does have regular access to food.   Depression Screen PHQ 2/9 Scores 02/26/2020 02/21/2020 08/20/2019 02/19/2019 10/05/2018 10/03/2018 08/18/2018  PHQ - 2 Score 0 0 0 0 0 0 0     Fall Risk Fall Risk  02/26/2020 02/21/2020 08/20/2019 02/19/2019 10/05/2018  Falls in the past year? 0 0 0 0 0  Number falls in past yr: - 0 - - -  Injury with Fall? - 0 - - -  Follow up - Falls evaluation completed - - -  Comment - - - - -     Objective:  Laura Acevedo seemed alert and oriented and she participated appropriately during our telephone visit.  Blood Pressure Weight BMI  BP Readings from Last 3 Encounters:  02/21/20 (!) 146/78  08/20/19 (!) 145/68  02/19/19 138/66   Wt Readings from Last 3 Encounters:  02/21/20 131 lb 12.8 oz (59.8 kg)  08/20/19 130 lb (59 kg)  02/19/19 130 lb (59 kg)   BMI Readings from Last 1 Encounters:   02/21/20 21.93 kg/m    *Unable to obtain current vital signs, weight, and BMI due to telephone visit type  Hearing/Vision  . Aneyah did not seem to have difficulty with hearing/understanding during the telephone conversation . Reports that she has had a formal eye exam by an eye care professional within the past year . Reports that she has not had a formal hearing evaluation within the past year *Unable to fully assess hearing and vision during telephone visit type  Cognitive Function: 6CIT Screen 02/26/2020 10/03/2018  What Year? 0 points 0 points  What month? 0 points 0 points  What time? 0 points 0 points  Count back from 20 2 points 0 points  Months in reverse 0 points 0 points  Repeat phrase 0 points 0 points  Total Score 2 0   (Normal:0-7, Significant for Dysfunction: >8)  Normal Cognitive Function Screening: Yes   Immunization & Health Maintenance Record Immunization History  Administered Date(s) Administered  . Fluad Quad(high Dose 65+) 11/03/2018, 11/15/2019  . Influenza, High Dose Seasonal PF 11/04/2016, 11/07/2017  . Influenza,inj,Quad PF,6+ Mos 11/22/2012, 11/05/2013, 10/30/2014, 11/04/2015  . Moderna Sars-Covid-2 Vaccination 01/01/2020  . Pneumococcal Conjugate-13 11/05/2013  . Pneumococcal Polysaccharide-23 11/19/2014    Health Maintenance  Topic Date Due  . COVID-19 Vaccine (2 - Moderna 3-dose series) 01/29/2020  . TETANUS/TDAP  08/19/2020 (Originally 11/18/2012)  . MAMMOGRAM  11/15/2020  . DEXA SCAN  02/18/2021  . INFLUENZA VACCINE  Completed  . PNA vac Low Risk Adult  Completed       Assessment  This is a routine wellness examination for Laura Acevedo.  Health Maintenance: Due or Overdue Health Maintenance Due  Topic Date Due  . COVID-19 Vaccine (2 - Moderna 3-dose series) 01/29/2020    Laura Acevedo does not need a referral for Community Assistance: Care Management:   no Social Work:    no Prescription Assistance:  no Nutrition/Diabetes  Education:  no   Plan:  Personalized Goals Goals Addressed  This Visit's Progress   .  Patient Stated (pt-stated)        I would like to clean out all of my old clothes and shoes in the house      Personalized Health Maintenance & Screening Recommendations  Advanced directives: has NO advanced directive - not interested in additional information  Lung Cancer Screening Recommended: no (Low Dose CT Chest recommended if Age 71-80 years, 30 pack-year currently smoking OR have quit w/in past 15 years) Hepatitis C Screening recommended: no HIV Screening recommended: no  Advanced Directives: Written information was not prepared per patient's request.  Referrals & Orders No orders of the defined types were placed in this encounter.   Follow-up Plan . Follow-up with Chevis Pretty, FNP as planned   I have personally reviewed and noted the following in the patient's chart:   . Medical and social history . Use of alcohol, tobacco or illicit drugs  . Current medications and supplements . Functional ability and status . Nutritional status . Physical activity . Advanced directives . List of other physicians . Hospitalizations, surgeries, and ER visits in previous 12 months . Vitals . Screenings to include cognitive, depression, and falls . Referrals and appointments  In addition, I have reviewed and discussed with Laura Acevedo certain preventive protocols, quality metrics, and best practice recommendations. A written personalized care plan for preventive services as well as general preventive health recommendations is available and can be mailed to the patient at her request.      Milas Hock, LPN  0/07/1217

## 2020-02-26 NOTE — Patient Instructions (Signed)
Preventive Care 85 Years and Older, Female Preventive care refers to lifestyle choices and visits with your health care provider that can promote health and wellness. This includes:  A yearly physical exam. This is also called an annual wellness visit.  Regular dental and eye exams.  Immunizations.  Screening for certain conditions.  Healthy lifestyle choices, such as: ? Eating a healthy diet. ? Getting regular exercise. ? Not using drugs or products that contain nicotine and tobacco. ? Limiting alcohol use. What can I expect for my preventive care visit? Physical exam Your health care provider will check your:  Height and weight. These may be used to calculate your BMI (body mass index). BMI is a measurement that tells if you are at a healthy weight.  Heart rate and blood pressure.  Body temperature.  Skin for abnormal spots. Counseling Your health care provider may ask you questions about your:  Past medical problems.  Family's medical history.  Alcohol, tobacco, and drug use.  Emotional well-being.  Home life and relationship well-being.  Sexual activity.  Diet, exercise, and sleep habits.  History of falls.  Memory and ability to understand (cognition).  Work and work Statistician.  Pregnancy and menstrual history.  Access to firearms. What immunizations do I need? Vaccines are usually given at various ages, according to a schedule. Your health care provider will recommend vaccines for you based on your age, medical history, and lifestyle or other factors, such as travel or where you work.   What tests do I need? Blood tests  Lipid and cholesterol levels. These may be checked every 5 years, or more often depending on your overall health.  Hepatitis C test.  Hepatitis B test. Screening  Lung cancer screening. You may have this screening every year starting at age 85 if you have a 30-pack-year history of smoking and currently smoke or have quit within  the past 15 years.  Colorectal cancer screening. ? All adults should have this screening starting at age 85 and continuing until age 85. ? Your health care provider may recommend screening at age 85 if you are at increased risk. ? You will have tests every 1-10 years, depending on your results and the type of screening test.  Diabetes screening. ? This is done by checking your blood sugar (glucose) after you have not eaten for a while (fasting). ? You may have this done every 1-3 years.  Mammogram. ? This may be done every 1-2 years. ? Talk with your health care provider about how often you should have regular mammograms.  Abdominal aortic aneurysm (AAA) screening. You may need this if you are a current or former smoker.  BRCA-related cancer screening. This may be done if you have a family history of breast, ovarian, tubal, or peritoneal cancers. Other tests  STD (sexually transmitted disease) testing, if you are at risk.  Bone density scan. This is done to screen for osteoporosis. You may have this done starting at age 85. Talk with your health care provider about your test results, treatment options, and if necessary, the need for more tests. Follow these instructions at home: Eating and drinking  Eat a diet that includes fresh fruits and vegetables, whole grains, lean protein, and low-fat dairy products. Limit your intake of foods with high amounts of sugar, saturated fats, and salt.  Take vitamin and mineral supplements as recommended by your health care provider.  Do not drink alcohol if your health care provider tells you not to drink.  If you drink alcohol: ? Limit how much you have to 0-1 drink a day. ? Be aware of how much alcohol is in your drink. In the [place] one drink equals one 12 oz bottle of beer (355 mL), one 5 oz glass of wine (148 mL), or one 1 oz glass of hard liquor (44 mL).   Lifestyle  Take daily care of your teeth and gums. Brush your teeth every morning  and night with fluoride toothpaste. Floss one time each day.  Stay active. Exercise for at least 30 minutes 5 or more days each week.  Do not use any products that contain nicotine or tobacco, such as cigarettes, e-cigarettes, and chewing tobacco. If you need help quitting, ask your health care provider.  Do not use drugs.  If you are sexually active, practice safe sex. Use a condom or other form of protection in order to prevent STIs (sexually transmitted infections).  Talk with your health care provider about taking a low-dose aspirin or statin.  Find healthy ways to cope with stress, such as: ? Meditation, yoga, or listening to music. ? Journaling. ? Talking to a trusted person. ? Spending time with friends and family. Safety  Always wear your seat belt while driving or riding in a vehicle.  Do not drive: ? If you have been drinking alcohol. Do not ride with someone who has been drinking. ? When you are tired or distracted. ? While texting.  Wear a helmet and other protective equipment during sports activities.  If you have firearms in your house, make sure you follow all gun safety procedures. What's next?  Visit your health care provider once a year for an annual wellness visit.  Ask your health care provider how often you should have your eyes and teeth checked.  Stay up to date on all vaccines. This information is not intended to replace advice given to you by your health care provider. Make sure you discuss any questions you have with your health care provider. Document Revised: 12/26/2019 Document Reviewed: 12/29/2017 Elsevier Patient Education  2021 Elsevier Inc.  

## 2020-03-27 DIAGNOSIS — Z1283 Encounter for screening for malignant neoplasm of skin: Secondary | ICD-10-CM | POA: Diagnosis not present

## 2020-03-27 DIAGNOSIS — Z85820 Personal history of malignant melanoma of skin: Secondary | ICD-10-CM | POA: Diagnosis not present

## 2020-03-27 DIAGNOSIS — D225 Melanocytic nevi of trunk: Secondary | ICD-10-CM | POA: Diagnosis not present

## 2020-03-27 DIAGNOSIS — Z08 Encounter for follow-up examination after completed treatment for malignant neoplasm: Secondary | ICD-10-CM | POA: Diagnosis not present

## 2020-05-01 DIAGNOSIS — I70203 Unspecified atherosclerosis of native arteries of extremities, bilateral legs: Secondary | ICD-10-CM | POA: Diagnosis not present

## 2020-05-01 DIAGNOSIS — M79676 Pain in unspecified toe(s): Secondary | ICD-10-CM | POA: Diagnosis not present

## 2020-05-01 DIAGNOSIS — L84 Corns and callosities: Secondary | ICD-10-CM | POA: Diagnosis not present

## 2020-06-23 ENCOUNTER — Other Ambulatory Visit: Payer: Self-pay | Admitting: Nurse Practitioner

## 2020-06-23 DIAGNOSIS — Z85820 Personal history of malignant melanoma of skin: Secondary | ICD-10-CM | POA: Diagnosis not present

## 2020-06-23 DIAGNOSIS — Z08 Encounter for follow-up examination after completed treatment for malignant neoplasm: Secondary | ICD-10-CM | POA: Diagnosis not present

## 2020-06-23 DIAGNOSIS — D485 Neoplasm of uncertain behavior of skin: Secondary | ICD-10-CM | POA: Diagnosis not present

## 2020-06-23 DIAGNOSIS — D2272 Melanocytic nevi of left lower limb, including hip: Secondary | ICD-10-CM | POA: Diagnosis not present

## 2020-06-23 DIAGNOSIS — I10 Essential (primary) hypertension: Secondary | ICD-10-CM

## 2020-06-23 DIAGNOSIS — Z1283 Encounter for screening for malignant neoplasm of skin: Secondary | ICD-10-CM | POA: Diagnosis not present

## 2020-06-23 DIAGNOSIS — D225 Melanocytic nevi of trunk: Secondary | ICD-10-CM | POA: Diagnosis not present

## 2020-07-17 DIAGNOSIS — D485 Neoplasm of uncertain behavior of skin: Secondary | ICD-10-CM | POA: Diagnosis not present

## 2020-07-17 DIAGNOSIS — L905 Scar conditions and fibrosis of skin: Secondary | ICD-10-CM | POA: Diagnosis not present

## 2020-07-17 DIAGNOSIS — L98499 Non-pressure chronic ulcer of skin of other sites with unspecified severity: Secondary | ICD-10-CM | POA: Diagnosis not present

## 2020-07-22 ENCOUNTER — Ambulatory Visit (INDEPENDENT_AMBULATORY_CARE_PROVIDER_SITE_OTHER): Payer: Medicare Other

## 2020-07-22 ENCOUNTER — Other Ambulatory Visit: Payer: Self-pay

## 2020-07-22 DIAGNOSIS — M810 Age-related osteoporosis without current pathological fracture: Secondary | ICD-10-CM | POA: Diagnosis not present

## 2020-07-22 MED ORDER — DENOSUMAB 60 MG/ML ~~LOC~~ SOSY
60.0000 mg | PREFILLED_SYRINGE | Freq: Once | SUBCUTANEOUS | Status: AC
  Administered 2020-07-22: 60 mg via SUBCUTANEOUS

## 2020-07-22 NOTE — Progress Notes (Signed)
Prolia injection given to left arm.  Patient tolerated well. 

## 2020-08-26 ENCOUNTER — Ambulatory Visit (INDEPENDENT_AMBULATORY_CARE_PROVIDER_SITE_OTHER): Payer: Medicare Other | Admitting: Nurse Practitioner

## 2020-08-26 ENCOUNTER — Encounter: Payer: Self-pay | Admitting: Nurse Practitioner

## 2020-08-26 ENCOUNTER — Other Ambulatory Visit: Payer: Self-pay

## 2020-08-26 VITALS — BP 136/72 | HR 77 | Temp 98.4°F | Resp 20 | Ht 65.0 in | Wt 128.0 lb

## 2020-08-26 DIAGNOSIS — F411 Generalized anxiety disorder: Secondary | ICD-10-CM

## 2020-08-26 DIAGNOSIS — I10 Essential (primary) hypertension: Secondary | ICD-10-CM | POA: Diagnosis not present

## 2020-08-26 DIAGNOSIS — M810 Age-related osteoporosis without current pathological fracture: Secondary | ICD-10-CM

## 2020-08-26 DIAGNOSIS — E782 Mixed hyperlipidemia: Secondary | ICD-10-CM | POA: Diagnosis not present

## 2020-08-26 LAB — LIPID PANEL
Chol/HDL Ratio: 1.8 ratio (ref 0.0–4.4)
Cholesterol, Total: 154 mg/dL (ref 100–199)
HDL: 86 mg/dL (ref >39)
LDL Chol Calc (NIH): 52 mg/dL (ref 0–99)
Triglycerides: 86 mg/dL (ref 0–149)
VLDL Cholesterol Cal: 16 mg/dL (ref 5–40)

## 2020-08-26 LAB — CBC WITH DIFFERENTIAL/PLATELET
Basophils Absolute: 0 10*3/uL (ref 0.0–0.2)
Basos: 1 %
EOS (ABSOLUTE): 0.1 10*3/uL (ref 0.0–0.4)
Eos: 1 %
Hematocrit: 38.6 % (ref 34.0–46.6)
Hemoglobin: 13.2 g/dL (ref 11.1–15.9)
Immature Grans (Abs): 0 10*3/uL (ref 0.0–0.1)
Immature Granulocytes: 0 %
Lymphocytes Absolute: 1.3 10*3/uL (ref 0.7–3.1)
Lymphs: 22 %
MCH: 32.1 pg (ref 26.6–33.0)
MCHC: 34.2 g/dL (ref 31.5–35.7)
MCV: 94 fL (ref 79–97)
Monocytes Absolute: 0.6 10*3/uL (ref 0.1–0.9)
Monocytes: 10 %
Neutrophils Absolute: 3.8 10*3/uL (ref 1.4–7.0)
Neutrophils: 66 %
Platelets: 253 10*3/uL (ref 150–450)
RBC: 4.11 x10E6/uL (ref 3.77–5.28)
RDW: 11.8 % (ref 11.7–15.4)
WBC: 5.8 10*3/uL (ref 3.4–10.8)

## 2020-08-26 LAB — CMP14+EGFR
ALT: 21 IU/L (ref 0–32)
AST: 26 IU/L (ref 0–40)
Albumin/Globulin Ratio: 1.8 (ref 1.2–2.2)
Albumin: 4.4 g/dL (ref 3.6–4.6)
Alkaline Phosphatase: 54 IU/L (ref 44–121)
BUN/Creatinine Ratio: 21 (ref 12–28)
BUN: 25 mg/dL (ref 8–27)
Bilirubin Total: 0.8 mg/dL (ref 0.0–1.2)
CO2: 24 mmol/L (ref 20–29)
Calcium: 9.9 mg/dL (ref 8.7–10.3)
Chloride: 101 mmol/L (ref 96–106)
Creatinine, Ser: 1.2 mg/dL — ABNORMAL HIGH (ref 0.57–1.00)
Globulin, Total: 2.4 g/dL (ref 1.5–4.5)
Glucose: 106 mg/dL — ABNORMAL HIGH (ref 65–99)
Potassium: 4.4 mmol/L (ref 3.5–5.2)
Sodium: 140 mmol/L (ref 134–144)
Total Protein: 6.8 g/dL (ref 6.0–8.5)
eGFR: 44 mL/min/{1.73_m2} — ABNORMAL LOW (ref >59)

## 2020-08-26 MED ORDER — ATORVASTATIN CALCIUM 20 MG PO TABS: 20.0000 mg | ORAL_TABLET | Freq: Every day | ORAL | 1 refills | Status: DC

## 2020-08-26 MED ORDER — LISINOPRIL-HYDROCHLOROTHIAZIDE 10-12.5 MG PO TABS: 1.0000 | ORAL_TABLET | Freq: Every day | ORAL | 1 refills | Status: DC

## 2020-08-26 NOTE — Patient Instructions (Signed)

## 2020-08-26 NOTE — Progress Notes (Signed)
Subjective:    Patient ID: Laura Acevedo, female    DOB: 16-May-1932, 87 y.o.   MRN: [id number]   Chief Complaint: Medical Management of Chronic Issues    HPI:  1. Essential hypertension, benign No c/o chest pain, sob or headache. Does not check blood pressure at home. BP Readings from Last 3 Encounters:  08/26/20 136/72  02/21/20 (!) 146/78  08/20/19 (!) 145/68     2. Mixed hyperlipidemia Does not watch diet and does very little exercise. Lab Results  Component Value Date   CHOL 204 (H) 02/21/2020   HDL 101 02/21/2020   LDLCALC 90 02/21/2020   TRIG 75 02/21/2020   CHOLHDL 2.0 02/21/2020     3. GAD (generalized anxiety disorder) Is doing well. GAD 7 : Generalized Anxiety Score 08/26/2020 02/21/2020 08/20/2019 02/19/2019  Nervous, Anxious, on Edge 1 0 0 0  Control/stop worrying 0 '2 1 1  '$ Worry too much - different things 1 0 1 1  Trouble relaxing 0 0 0 0  Restless 0 0 0 0  Easily annoyed or irritable 0 0 0 0  Afraid - awful might happen 1 1 0 1  Total GAD 7 Score '3 3 2 3  '$ Anxiety Difficulty Somewhat difficult Somewhat difficult Not difficult at all Not difficult at all      4. Age-related osteoporosis without current pathological fracture Last dexascan was done 02/19/19. T score was -2.1. she doe sno weight bearing exercise.    Outpatient Encounter Medications as of 08/26/2020  Medication Sig   aspirin EC 81 MG tablet Take 81 mg by mouth daily.   atorvastatin (LIPITOR) 20 MG tablet Take 1 tablet (20 mg total) by mouth daily.   calcium carbonate (OS-CAL) 600 MG TABS Take 600 mg by mouth daily. AT LUNCH   cholecalciferol (VITAMIN D) 1000 UNITS tablet Take 1,000 Units by mouth daily.    denosumab (PROLIA) 60 MG/ML SOLN injection Inject 60 mg into the skin every 6 (six) months. Administer in upper arm, thigh, or abdomen   doxycycline (VIBRA-TABS) 100 MG tablet Take 100 mg by mouth 2 (two) times daily.   lisinopril-hydrochlorothiazide (ZESTORETIC) 10-12.5 MG tablet TAKE 1  TABLET BY MOUTH  DAILY   Multiple Vitamins-Minerals (CENTRUM SILVER PO) Take 1 tablet by mouth every morning.   Multiple Vitamins-Minerals (PRESERVISION AREDS 2 PO) Take by mouth 2 (two) times daily.   No facility-administered encounter medications on file as of 08/26/2020.    Past Surgical History:  Procedure Laterality Date   CATARACT EXTRACTION     FOOT NEUROMA SURGERY Left 2005   Dr Irving Shows   MELANOMA EXCISION Left    leg    Family History  Problem Relation Age of Onset   Diabetes Mother    Heart disease Mother    Heart disease Father    Heart attack Father    Kidney disease Father    Hypertension Sister    Diabetes Brother    Hypertension Brother    Cancer Brother        bladder    Diabetes Brother    Diabetes Brother    Colon cancer Neg Hx     New complaints: None today  Social history: Lives with her husband  Controlled substance contract: n/a     Review of Systems  Constitutional:  Negative for diaphoresis.  Eyes:  Negative for pain.  Respiratory:  Negative for shortness of breath.   Cardiovascular:  Negative for chest pain, palpitations and leg swelling.  Gastrointestinal:  Negative for abdominal pain.  Endocrine: Negative for polydipsia.  Skin:  Negative for rash.  Neurological:  Negative for dizziness, weakness and headaches.  Hematological:  Does not bruise/bleed easily.  All other systems reviewed and are negative.     Objective:   Physical Exam Vitals and nursing note reviewed.  Constitutional:      General: She is not in acute distress.    Appearance: Normal appearance. She is well-developed.  HENT:     Head: Normocephalic.     Right Ear: Tympanic membrane normal.     Left Ear: Tympanic membrane normal.     Nose: Nose normal.     Mouth/Throat:     Mouth: Mucous membranes are moist.  Eyes:     Pupils: Pupils are equal, round, and reactive to light.  Neck:     Vascular: No carotid bruit or JVD.  Cardiovascular:     Rate and Rhythm:  Normal rate and regular rhythm.     Heart sounds: Normal heart sounds.     Comments: Multiple varicose veins Pulmonary:     Effort: Pulmonary effort is normal. No respiratory distress.     Breath sounds: Normal breath sounds. No wheezing or rales.  Chest:     Chest wall: No tenderness.  Abdominal:     General: Bowel sounds are normal. There is no distension or abdominal bruit.     Palpations: Abdomen is soft. There is no hepatomegaly, splenomegaly, mass or pulsatile mass.     Tenderness: There is no abdominal tenderness.  Musculoskeletal:        General: Normal range of motion.     Cervical back: Normal range of motion and neck supple.  Lymphadenopathy:     Cervical: No cervical adenopathy.  Skin:    General: Skin is warm and dry.  Neurological:     Mental Status: She is alert and oriented to person, place, and time.     Deep Tendon Reflexes: Reflexes are normal and symmetric.  Psychiatric:        Behavior: Behavior normal.        Thought Content: Thought content normal.        Judgment: Judgment normal.    BP 136/72   Pulse 77   Temp 98.4 F (36.9 C) (Temporal)   Resp 20   Ht '5\' 5"'$  (1.651 m)   Wt 128 lb (58.1 kg)   SpO2 98%   BMI 21.30 kg/m        Assessment & Plan:  Laura Acevedo comes in today with chief complaint of Medical Management of Chronic Issues   Diagnosis and orders addressed:  1. Essential hypertension, benign Low sodium diet - lisinopril-hydrochlorothiazide (ZESTORETIC) 10-12.5 MG tablet; Take 1 tablet by mouth daily.  Dispense: 90 tablet; Refill: 1  2. Mixed hyperlipidemia Low fat diet - atorvastatin (LIPITOR) 20 MG tablet; Take 1 tablet (20 mg total) by mouth daily.  Dispense: 90 tablet; Refill: 1  3. GAD (generalized anxiety disorder) Stress management  4. Age-related osteoporosis without current pathological fracture Weight bearing exercise when can   Labs pending Health Maintenance reviewed Diet and exercise encouraged  Follow up  plan: 6 months   [place], FNP

## 2020-08-26 NOTE — Addendum Note (Signed)
Addended by: Chevis Pretty on: 08/26/2020 10:57 AM   Modules accepted: Orders

## 2020-09-29 DIAGNOSIS — Z1283 Encounter for screening for malignant neoplasm of skin: Secondary | ICD-10-CM | POA: Diagnosis not present

## 2020-09-29 DIAGNOSIS — Z08 Encounter for follow-up examination after completed treatment for malignant neoplasm: Secondary | ICD-10-CM | POA: Diagnosis not present

## 2020-09-29 DIAGNOSIS — Z85820 Personal history of malignant melanoma of skin: Secondary | ICD-10-CM | POA: Diagnosis not present

## 2020-12-09 ENCOUNTER — Other Ambulatory Visit: Payer: Self-pay

## 2020-12-09 ENCOUNTER — Ambulatory Visit (INDEPENDENT_AMBULATORY_CARE_PROVIDER_SITE_OTHER): Payer: Medicare Other

## 2020-12-09 DIAGNOSIS — Z23 Encounter for immunization: Secondary | ICD-10-CM

## 2020-12-29 DIAGNOSIS — Z85820 Personal history of malignant melanoma of skin: Secondary | ICD-10-CM | POA: Diagnosis not present

## 2020-12-29 DIAGNOSIS — Z1283 Encounter for screening for malignant neoplasm of skin: Secondary | ICD-10-CM | POA: Diagnosis not present

## 2020-12-29 DIAGNOSIS — Z08 Encounter for follow-up examination after completed treatment for malignant neoplasm: Secondary | ICD-10-CM | POA: Diagnosis not present

## 2021-02-09 ENCOUNTER — Telehealth: Payer: Self-pay | Admitting: Nurse Practitioner

## 2021-02-09 NOTE — Telephone Encounter (Signed)
We have ordered prolia for patient.

## 2021-02-17 NOTE — Telephone Encounter (Signed)
Pt called and aware prolia here = she will take while here 2/9 at OV with MMM

## 2021-02-26 ENCOUNTER — Ambulatory Visit (INDEPENDENT_AMBULATORY_CARE_PROVIDER_SITE_OTHER): Payer: Medicare Other | Admitting: Nurse Practitioner

## 2021-02-26 ENCOUNTER — Encounter: Payer: Self-pay | Admitting: Nurse Practitioner

## 2021-02-26 VITALS — BP 147/75 | HR 79 | Temp 97.7°F | Resp 20 | Ht 65.0 in | Wt 118.0 lb

## 2021-02-26 DIAGNOSIS — M810 Age-related osteoporosis without current pathological fracture: Secondary | ICD-10-CM | POA: Diagnosis not present

## 2021-02-26 DIAGNOSIS — F411 Generalized anxiety disorder: Secondary | ICD-10-CM | POA: Diagnosis not present

## 2021-02-26 DIAGNOSIS — I10 Essential (primary) hypertension: Secondary | ICD-10-CM

## 2021-02-26 DIAGNOSIS — R634 Abnormal weight loss: Secondary | ICD-10-CM

## 2021-02-26 DIAGNOSIS — E782 Mixed hyperlipidemia: Secondary | ICD-10-CM

## 2021-02-26 LAB — LIPID PANEL

## 2021-02-26 MED ORDER — LISINOPRIL-HYDROCHLOROTHIAZIDE 10-12.5 MG PO TABS: 1.0000 | ORAL_TABLET | Freq: Every day | ORAL | 1 refills | Status: DC

## 2021-02-26 MED ORDER — ATORVASTATIN CALCIUM 20 MG PO TABS: 20.0000 mg | ORAL_TABLET | Freq: Every day | ORAL | 1 refills | Status: DC

## 2021-02-26 MED ORDER — DENOSUMAB 60 MG/ML ~~LOC~~ SOSY
60.0000 mg | PREFILLED_SYRINGE | Freq: Once | SUBCUTANEOUS | Status: AC
  Administered 2021-02-26: 60 mg via SUBCUTANEOUS

## 2021-02-26 NOTE — Patient Instructions (Signed)
Bone Health Bones protect organs, store calcium, anchor muscles, and support the whole body. Keeping your bones strong is important, especially as you get older. You can take actions to help keep your bones strong and healthy. Why is keeping my bones healthy important? Keeping your bones healthy is important because your body constantly replaces bone cells. Cells get old, and new cells take their place. As we age, we lose bone cells because the body may not be able to make enough new cells to replace the old cells. The amount of bone cells and bone tissue you have is referred to as bone mass. The higher your bone mass, the stronger your bones. The aging process leads to an overall loss of bone mass in the body, which can increase the likelihood of: Broken bones. A condition in which the bones become weak and brittle (osteoporosis). A large decline in bone mass occurs in older adults. In women, it occurs about the time of menopause. What actions can I take to keep my bones healthy? Good health habits are important for maintaining healthy bones. This includes eating nutritious foods and exercising regularly. To have healthy bones, you need to get enough of the right minerals and vitamins. Most nutrition experts recommend getting these nutrients from the foods that you eat. In some cases, taking supplements may also be recommended. Doing certain types of exercise is also important for bone health. What are the nutritional recommendations for healthy bones? Eating a well-balanced diet with plenty of calcium and vitamin D will help to protect your bones. Nutritional recommendations vary from person to person. Ask your health care provider what is healthy for you. Here are some general guidelines. Get enough calcium Calcium is the most important (essential) mineral for bone health. Most people can get enough calcium from their diet, but supplements may be recommended for people who are at risk for  osteoporosis. Good sources of calcium include: Dairy products, such as low-fat or nonfat milk, cheese, and yogurt. Dark green leafy vegetables, such as bok choy and broccoli. Foods that have calcium added to them (are fortified). Foods that may be fortified with calcium include orange juice, cereal, bread, soy beverages, and tofu products. Nuts, such as almonds. Follow these recommended amounts for daily calcium intake: Infants, 0-6 months: 200 mg. Infants, 6-12 months: 260 mg. Children, age 69-3: 700 mg. Children, age 40-8: 1,000 mg. Children, age 29-13: 1,300 mg. Teens, age 84-18: 1,300 mg. Adults, age 33-50: 1,000 mg. Adults, age 74-70: Men: 1,000 mg. Women: 1,200 mg. Adults, age 34 or older: 1,200 mg. Pregnant and breastfeeding females: Teens: 1,300 mg. Adults: 1,000 mg. Get enough vitamin D Vitamin D is the most essential vitamin for bone health. It helps the body absorb calcium. Sunlight stimulates the skin to make vitamin D, so be sure to get enough sunlight. If you live in a cold climate or you do not get outside often, your health care provider may recommend that you take vitamin D supplements. Good sources of vitamin D in your diet include: Egg yolks. Saltwater fish. Milk and cereal fortified with vitamin D. Follow these recommended amounts for daily vitamin D intake: Infants, 0-12 months: 400 international units (IU). Children and teens, age 69-18: 39 international units. Adults, age 71 or younger: 50 international units. Adults, age 28 or older: 63-1,000 international units. Get other important nutrients Other nutrients that are important for bone health include: Phosphorus. This mineral is found in meat, poultry, dairy foods, nuts, and legumes. The recommended daily  intake for adult men and adult women is 700 mg. Magnesium. This mineral is found in seeds, nuts, dark green vegetables, and legumes. The recommended daily intake for adult men is 400-420 mg. For adult women,  it is 310-320 mg. Vitamin K. This vitamin is found in green leafy vegetables. The recommended daily intake is 120 mcg for adult men and 90 mcg for adult women. What type of physical activity is best for building and maintaining healthy bones? Weight-bearing and strength-building activities are important for building and maintaining healthy bones. Weight-bearing activities cause muscles and bones to work against gravity. Strength-building activities increase the strength of the muscles that support bones. Weight-bearing and muscle-building activities include: Walking and hiking. Jogging and running. Dancing. Gym exercises. Lifting weights. Tennis and racquetball. Climbing stairs. Aerobics. Adults should get at least 30 minutes of moderate physical activity on most days. Children should get at least 60 minutes of moderate physical activity on most days. Ask your health care provider what type of exercise is best for you. How can I find out if my bone mass is low? Bone mass can be measured with an X-ray test called a bone mineral density (BMD) test. This test is recommended for all women who are age 69 or older. It may also be recommended for: Men who are age 81 or older. People who are at risk for osteoporosis because of: Having a long-term disease that weakens bones, such as kidney disease or rheumatoid arthritis. Having menopause earlier than normal. Taking medicine that weakens bones, such as steroids, thyroid hormones, or hormone treatment for breast cancer or prostate cancer. Smoking. Drinking three or more alcoholic drinks a day. Being underweight. Sedentary lifestyle. If you find that you have a low bone mass, you may be able to prevent osteoporosis or further bone loss by changing your diet and lifestyle. Where can I find more information? Bone Health & Osteoporosis Foundation: [employer] of Health: www.bones.[employer]Exposed.es International Osteoporosis  Foundation: Administrator.iofbonehealth.org Summary The aging process leads to an overall loss of bone mass in the body, which can increase the likelihood of broken bones and osteoporosis. Eating a well-balanced diet with plenty of calcium and vitamin D will help to protect your bones. Weight-bearing and strength-building activities are also important for building and maintaining strong bones. Bone mass can be measured with an X-ray test called a bone mineral density (BMD) test. This information is not intended to replace advice given to you by your health care provider. Make sure you discuss any questions you have with your health care provider. Document Revised: 06/18/2020 Document Reviewed: 06/18/2020 Elsevier Patient Education  [place].

## 2021-02-26 NOTE — Addendum Note (Signed)
Addended by: Rolena Infante on: 02/26/2021 11:16 AM   Modules accepted: Orders

## 2021-02-26 NOTE — Progress Notes (Signed)
Subjective:    Patient ID: Laura Acevedo, female    DOB: [birth year], 88 y.o.   MRN: [id number]   Chief Complaint: medical management of chronic issues     HPI:  Laura Acevedo is a 88 y.o. who identifies as a female who was assigned female at birth.   Social history: Lives with: husband died in 10-30-2022 Work history: retired   Comes in today for follow up of the following chronic medical issues:  1. Essential hypertension, benign No c/o chest pain. Sob or headache. Does not check blood pressure at home. BP Readings from Last 3 Encounters:  08/26/20 136/72  02/21/20 (!) 146/78  08/20/19 (!) 145/68     2. Mixed hyperlipidemia Does watch diet and stays very active. But does no dedicated exercise. Lab Results  Component Value Date   CHOL 154 08/26/2020   HDL 86 08/26/2020   LDLCALC 52 08/26/2020   TRIG 86 08/26/2020   CHOLHDL 1.8 08/26/2020  The ASCVD Risk score (Arnett DK, et al., 2019) failed to calculate for the following reasons:   The 2019 ASCVD risk score is only valid for ages 55 to 52    3. GAD (generalized anxiety disorder) Is on no meds. GAD 7 : Generalized Anxiety Score 08/26/2020 02/21/2020 08/20/2019 02/19/2019  Nervous, Anxious, on Edge 1 0 0 0  Control/stop worrying 0 _0 Worry too much - different things 1 0 1 1  Trouble relaxing 0 0 0 0  Restless 0 0 0 0  Easily annoyed or irritable 0 0 0 0  Afraid - awful might happen 1 1 0 1  Total GAD 7 Score _1 Anxiety Difficulty Somewhat difficult Somewhat difficult Not difficult at all Not difficult at all      4. Age-related osteoporosis without current pathological fracture Is on prolia injections. No c/o side effects. Last dexascan was done on 02/19/19 with t score of -2.1.   New complaints: Niece is concerned about weight loss. Patient says she has lost her appetite. Weight is down 10lbs. Wt Readings from Last 3 Encounters:  02/26/21 118 lb (53.5 kg)  08/26/20 128 lb (58.1 kg)  02/21/20 131 lb 12.8  oz (59.8 kg)   BMI Readings from Last 3 Encounters:  02/26/21 19.64 kg/m  08/26/20 21.30 kg/m  02/21/20 21.93 kg/m     No Known Allergies Outpatient Encounter Medications as of 02/26/2021  Medication Sig   aspirin EC 81 MG tablet Take 81 mg by mouth daily.   atorvastatin (LIPITOR) 20 MG tablet Take 1 tablet (20 mg total) by mouth daily.   calcium carbonate (OS-CAL) 600 MG TABS Take 600 mg by mouth daily. AT LUNCH   cholecalciferol (VITAMIN D) 1000 UNITS tablet Take 1,000 Units by mouth daily.    denosumab (PROLIA) 60 MG/ML SOLN injection Inject 60 mg into the skin every 6 (six) months. Administer in upper arm, thigh, or abdomen   doxycycline (VIBRA-TABS) 100 MG tablet Take 100 mg by mouth 2 (two) times daily.   lisinopril-hydrochlorothiazide (ZESTORETIC) 10-12.5 MG tablet Take 1 tablet by mouth daily.   Multiple Vitamins-Minerals (CENTRUM SILVER PO) Take 1 tablet by mouth every morning.   Multiple Vitamins-Minerals (PRESERVISION AREDS 2 PO) Take by mouth 2 (two) times daily.   No facility-administered encounter medications on file as of 02/26/2021.    Past Surgical History:  Procedure Laterality Date   CATARACT EXTRACTION     FOOT NEUROMA SURGERY Left 2005   Dr  Drake   MELANOMA EXCISION Left    leg    Family History  Problem Relation Age of Onset   Diabetes Mother    Heart disease Mother    Heart disease Father    Heart attack Father    Kidney disease Father    Hypertension Sister    Diabetes Brother    Hypertension Brother    Cancer Brother        bladder    Diabetes Brother    Diabetes Brother    Colon cancer Neg Hx       Controlled substance contract: n/a     Review of Systems  Constitutional:  Negative for diaphoresis.  Eyes:  Negative for pain.  Respiratory:  Negative for shortness of breath.   Cardiovascular:  Negative for chest pain, palpitations and leg swelling.  Gastrointestinal:  Negative for abdominal pain.  Endocrine: Negative for  polydipsia.  Skin:  Negative for rash.  Neurological:  Negative for dizziness, weakness and headaches.  Hematological:  Does not bruise/bleed easily.  All other systems reviewed and are negative.     Objective:   Physical Exam Vitals and nursing note reviewed.  Constitutional:      General: She is not in acute distress.    Appearance: Normal appearance. She is well-developed.  HENT:     Head: Normocephalic.     Right Ear: Tympanic membrane normal.     Left Ear: Tympanic membrane normal.     Nose: Nose normal.     Mouth/Throat:     Mouth: Mucous membranes are moist.  Eyes:     Pupils: Pupils are equal, round, and reactive to light.  Neck:     Vascular: No carotid bruit or JVD.  Cardiovascular:     Rate and Rhythm: Normal rate and regular rhythm.     Heart sounds: Normal heart sounds.  Pulmonary:     Effort: Pulmonary effort is normal. No respiratory distress.     Breath sounds: Normal breath sounds. No wheezing or rales.  Chest:     Chest wall: No tenderness.  Abdominal:     General: Bowel sounds are normal. There is no distension or abdominal bruit.     Palpations: Abdomen is soft. There is no hepatomegaly, splenomegaly, mass or pulsatile mass.     Tenderness: There is no abdominal tenderness.  Musculoskeletal:        General: Normal range of motion.     Cervical back: Normal range of motion and neck supple.  Lymphadenopathy:     Cervical: No cervical adenopathy.  Skin:    General: Skin is warm and dry.  Neurological:     Mental Status: She is alert and oriented to person, place, and time.     Deep Tendon Reflexes: Reflexes are normal and symmetric.  Psychiatric:        Behavior: Behavior normal.        Thought Content: Thought content normal.        Judgment: Judgment normal.    BP (!) 147/75    Pulse 79    Temp 97.7 F (36.5 C) (Temporal)    Resp 20    Ht _0  (1.651 m)    Wt 118 lb (53.5 kg)    HC 2" (5.1 cm)    SpO2 100%    BMI 19.64 kg/m         Assessment & Plan:  Laura Acevedo comes in today with chief complaint of Medical Management of Chronic Issues  Diagnosis and orders addressed:  1. Essential hypertension, benign Low dium diet - lisinopril-hydrochlorothiazide (ZESTORETIC) 10-12.5 MG tablet; Take 1 tablet by mouth daily.  Dispense: 90 tablet; Refill: 1 - CBC with Differential/Platelet - CMP14+EGFR - Lipid panel  2. Mixed hyperlipidemia Watch fats in diet - atorvastatin (LIPITOR) 20 MG tablet; Take 1 tablet (20 mg total) by mouth daily.  Dispense: 90 tablet; Refill: 1  3. GAD (generalized anxiety disorder) Stress management  4. Age-related osteoporosis without current pathological fracture Weight bearing exercises when can tolerate Will get prolia injection today  5. Weight loss Add ensure r boost daily May try megace if weight loss continues - Thyroid Panel With TSH   Labs pending Health Maintenance reviewed Diet and exercise encouraged  Follow up plan: 6 months   [place], FNP

## 2021-02-27 ENCOUNTER — Ambulatory Visit (INDEPENDENT_AMBULATORY_CARE_PROVIDER_SITE_OTHER): Payer: Medicare Other

## 2021-02-27 VITALS — Wt 118.0 lb

## 2021-02-27 DIAGNOSIS — Z0000 Encounter for general adult medical examination without abnormal findings: Secondary | ICD-10-CM

## 2021-02-27 LAB — CMP14+EGFR
ALT: 43 IU/L — ABNORMAL HIGH (ref 0–32)
AST: 37 IU/L (ref 0–40)
Albumin/Globulin Ratio: 1.9 (ref 1.2–2.2)
Albumin: 4.3 g/dL (ref 3.6–4.6)
Alkaline Phosphatase: 88 IU/L (ref 44–121)
BUN/Creatinine Ratio: 23 (ref 12–28)
BUN: 25 mg/dL (ref 8–27)
Bilirubin Total: 0.9 mg/dL (ref 0.0–1.2)
CO2: 27 mmol/L (ref 20–29)
Calcium: 10.3 mg/dL (ref 8.7–10.3)
Chloride: 98 mmol/L (ref 96–106)
Creatinine, Ser: 1.08 mg/dL — ABNORMAL HIGH (ref 0.57–1.00)
Globulin, Total: 2.3 g/dL (ref 1.5–4.5)
Glucose: 110 mg/dL — ABNORMAL HIGH (ref 70–99)
Potassium: 4.1 mmol/L (ref 3.5–5.2)
Sodium: 139 mmol/L (ref 134–144)
Total Protein: 6.6 g/dL (ref 6.0–8.5)
eGFR: 50 mL/min/{1.73_m2} — ABNORMAL LOW (ref >59)

## 2021-02-27 LAB — CBC WITH DIFFERENTIAL/PLATELET
Basophils Absolute: 0 10*3/uL (ref 0.0–0.2)
Basos: 1 %
EOS (ABSOLUTE): 0 10*3/uL (ref 0.0–0.4)
Eos: 0 %
Hematocrit: 41.2 % (ref 34.0–46.6)
Hemoglobin: 13.6 g/dL (ref 11.1–15.9)
Immature Grans (Abs): 0 10*3/uL (ref 0.0–0.1)
Immature Granulocytes: 0 %
Lymphocytes Absolute: 0.8 10*3/uL (ref 0.7–3.1)
Lymphs: 13 %
MCH: 31.9 pg (ref 26.6–33.0)
MCHC: 33 g/dL (ref 31.5–35.7)
MCV: 97 fL (ref 79–97)
Monocytes Absolute: 0.6 10*3/uL (ref 0.1–0.9)
Monocytes: 9 %
Neutrophils Absolute: 4.7 10*3/uL (ref 1.4–7.0)
Neutrophils: 77 %
Platelets: 253 10*3/uL (ref 150–450)
RBC: 4.26 x10E6/uL (ref 3.77–5.28)
RDW: 12.3 % (ref 11.7–15.4)
WBC: 6.1 10*3/uL (ref 3.4–10.8)

## 2021-02-27 LAB — LIPID PANEL
Chol/HDL Ratio: 1.8 ratio (ref 0.0–4.4)
Cholesterol, Total: 168 mg/dL (ref 100–199)
HDL: 93 mg/dL (ref >39)
LDL Chol Calc (NIH): 62 mg/dL (ref 0–99)
Triglycerides: 68 mg/dL (ref 0–149)
VLDL Cholesterol Cal: 13 mg/dL (ref 5–40)

## 2021-02-27 LAB — THYROID PANEL WITH TSH
Free Thyroxine Index: 2.3 (ref 1.2–4.9)
T3 Uptake Ratio: 30 % (ref 24–39)
T4, Total: 7.8 ug/dL (ref 4.5–12.0)
TSH: 1.1 u[IU]/mL (ref 0.450–4.500)

## 2021-02-27 NOTE — Patient Instructions (Signed)
[name] , Thank you for taking time to come for your Medicare Wellness Visit. I appreciate your ongoing commitment to your health goals. Please review the following plan we discussed and let me know if I can assist you in the future.   Screening recommendations/referrals: Colonoscopy: Done 12/20/2012 - no repeat required Mammogram: Done 11/16/2019 - no repeat required Bone Density: Done 02/19/2019 - Repeat every 2-5 years  Recommended yearly ophthalmology/optometry visit for glaucoma screening and checkup Recommended yearly dental visit for hygiene and checkup  Vaccinations: Influenza vaccine: Done 12/09/2020 -Repeat annually Pneumococcal vaccine: Done 11/05/2013 & 11/19/2014 Tdap vaccine: Done 11/19/2002 - Repeat in 10 years *past due Shingles vaccine: Due - recommend 2 doses 2-6 months apart   Covid-19: Done 02/22/2019, 03/23/2019, 01/01/2020  Advanced directives: in chart  Conditions/risks identified: Aim for 30 minutes of exercise or brisk walking each day, drink 6-8 glasses of water and eat lots of fruits and vegetables.   Next appointment: Follow up in one year for your annual wellness visit    Preventive Care 65 Years and Older, Female Preventive care refers to lifestyle choices and visits with your health care provider that can promote health and wellness. What does preventive care include? A yearly physical exam. This is also called an annual well check. Dental exams once or twice a year. Routine eye exams. Ask your health care provider how often you should have your eyes checked. Personal lifestyle choices, including: Daily care of your teeth and gums. Regular physical activity. Eating a healthy diet. Avoiding tobacco and drug use. Limiting alcohol use. Practicing safe sex. Taking low-dose aspirin every day. Taking vitamin and mineral supplements as recommended by your health care provider. What happens during an annual well check? The services and screenings done by your  health care provider during your annual well check will depend on your age, overall health, lifestyle risk factors, and family history of disease. Counseling  Your health care provider may ask you questions about your: Alcohol use. Tobacco use. Drug use. Emotional well-being. Home and relationship well-being. Sexual activity. Eating habits. History of falls. Memory and ability to understand (cognition). Work and work Statistician. Reproductive health. Screening  You may have the following tests or measurements: Height, weight, and BMI. Blood pressure. Lipid and cholesterol levels. These may be checked every 5 years, or more frequently if you are over 40 years old. Skin check. Lung cancer screening. You may have this screening every year starting at age 80 if you have a 30-pack-year history of smoking and currently smoke or have quit within the past 15 years. Fecal occult blood test (FOBT) of the stool. You may have this test every year starting at age 66. Flexible sigmoidoscopy or colonoscopy. You may have a sigmoidoscopy every 5 years or a colonoscopy every 10 years starting at age 65. Hepatitis C blood test. Hepatitis B blood test. Sexually transmitted disease (STD) testing. Diabetes screening. This is done by checking your blood sugar (glucose) after you have not eaten for a while (fasting). You may have this done every 1-3 years. Bone density scan. This is done to screen for osteoporosis. You may have this done starting at age 77. Mammogram. This may be done every 1-2 years. Talk to your health care provider about how often you should have regular mammograms. Talk with your health care provider about your test results, treatment options, and if necessary, the need for more tests. Vaccines  Your health care provider may recommend certain vaccines, such as: Influenza vaccine.  This is recommended every year. Tetanus, diphtheria, and acellular pertussis (Tdap, Td) vaccine. You may  need a Td booster every 10 years. Zoster vaccine. You may need this after age 7. Pneumococcal 13-valent conjugate (PCV13) vaccine. One dose is recommended after age 54. Pneumococcal polysaccharide (PPSV23) vaccine. One dose is recommended after age 39. Talk to your health care provider about which screenings and vaccines you need and how often you need them. This information is not intended to replace advice given to you by your health care provider. Make sure you discuss any questions you have with your health care provider. Document Released: 01/31/2015 Document Revised: 09/24/2015 Document Reviewed: 11/05/2014 Elsevier Interactive Patient Education  2017 [place] Prevention in the Home Falls can cause injuries. They can happen to people of all ages. There are many things you can do to make your home safe and to help prevent falls. What can I do on the outside of my home? Regularly fix the edges of walkways and driveways and fix any cracks. Remove anything that might make you trip as you walk through a door, such as a raised step or threshold. Trim any bushes or trees on the path to your home. Use bright outdoor lighting. Clear any walking paths of anything that might make someone trip, such as rocks or tools. Regularly check to see if handrails are loose or broken. Make sure that both sides of any steps have handrails. Any raised decks and porches should have guardrails on the edges. Have any leaves, snow, or ice cleared regularly. Use sand or salt on walking paths during winter. Clean up any spills in your garage right away. This includes oil or grease spills. What can I do in the bathroom? Use night lights. Install grab bars by the toilet and in the tub and shower. Do not use towel bars as grab bars. Use non-skid mats or decals in the tub or shower. If you need to sit down in the shower, use a plastic, non-slip stool. Keep the floor dry. Clean up any water that spills on  the floor as soon as it happens. Remove soap buildup in the tub or shower regularly. Attach bath mats securely with double-sided non-slip rug tape. Do not have throw rugs and other things on the floor that can make you trip. What can I do in the bedroom? Use night lights. Make sure that you have a light by your bed that is easy to reach. Do not use any sheets or blankets that are too big for your bed. They should not hang down onto the floor. Have a firm chair that has side arms. You can use this for support while you get dressed. Do not have throw rugs and other things on the floor that can make you trip. What can I do in the kitchen? Clean up any spills right away. Avoid walking on wet floors. Keep items that you use a lot in easy-to-reach places. If you need to reach something above you, use a strong step stool that has a grab bar. Keep electrical cords out of the way. Do not use floor polish or wax that makes floors slippery. If you must use wax, use non-skid floor wax. Do not have throw rugs and other things on the floor that can make you trip. What can I do with my stairs? Do not leave any items on the stairs. Make sure that there are handrails on both sides of the stairs and use them. Fix handrails  that are broken or loose. Make sure that handrails are as long as the stairways. Check any carpeting to make sure that it is firmly attached to the stairs. Fix any carpet that is loose or worn. Avoid having throw rugs at the top or bottom of the stairs. If you do have throw rugs, attach them to the floor with carpet tape. Make sure that you have a light switch at the top of the stairs and the bottom of the stairs. If you do not have them, ask someone to add them for you. What else can I do to help prevent falls? Wear shoes that: Do not have high heels. Have rubber bottoms. Are comfortable and fit you well. Are closed at the toe. Do not wear sandals. If you use a stepladder: Make sure  that it is fully opened. Do not climb a closed stepladder. Make sure that both sides of the stepladder are locked into place. Ask someone to hold it for you, if possible. Clearly mark and make sure that you can see: Any grab bars or handrails. First and last steps. Where the edge of each step is. Use tools that help you move around (mobility aids) if they are needed. These include: Canes. Walkers. Scooters. Crutches. Turn on the lights when you go into a dark area. Replace any light bulbs as soon as they burn out. Set up your furniture so you have a clear path. Avoid moving your furniture around. If any of your floors are uneven, fix them. If there are any pets around you, be aware of where they are. Review your medicines with your doctor. Some medicines can make you feel dizzy. This can increase your chance of falling. Ask your doctor what other things that you can do to help prevent falls. This information is not intended to replace advice given to you by your health care provider. Make sure you discuss any questions you have with your health care provider. Document Released: 10/31/2008 Document Revised: 06/12/2015 Document Reviewed: 02/08/2014 Elsevier Interactive Patient Education  2017 [employer].

## 2021-02-27 NOTE — Progress Notes (Signed)
Subjective:   Laura Acevedo is a 88 y.o. female who presents for Medicare Annual (Subsequent) preventive examination.  Virtual Visit via Telephone Note  I connected with  Laura Acevedo on 02/27/21 at  2:45 PM EST by telephone and verified that I am speaking with the correct person using two identifiers.  Location: Patient: Home Provider: [facility] Persons participating in the virtual visit: patient/Nurse Health Advisor   I discussed the limitations, risks, security and privacy concerns of performing an evaluation and management service by telephone and the availability of in person appointments. The patient expressed understanding and agreed to proceed.  Interactive audio and video telecommunications were attempted between this nurse and patient, however failed, due to patient having technical difficulties OR patient did not have access to video capability.  We continued and completed visit with audio only.  Some vital signs may be absent or patient reported.   Laura Acevedo E Laura Peckham, LPN   Review of Systems     Cardiac Risk Factors include: advanced age (>58men, >74 women);dyslipidemia;hypertension;sedentary lifestyle     Objective:    Today's Vitals   02/27/21 1456  Weight: 118 lb (53.5 kg)   Body mass index is 19.64 kg/m.  Advanced Directives 02/27/2021 02/26/2020 10/03/2018 09/29/2017 08/28/2015 08/23/2014 07/31/2014  Does Patient Have a Medical Advance Directive? Yes No No No Yes No Yes  Type of Paramedic of [place];Living will - - - [place];Living will - -  Does patient want to make changes to medical advance directive? - - - - No - Patient declined - Yes - information given  Copy of [place] in Chart? Yes - validated most recent copy scanned in chart (See row information) - - - Yes - -  Would patient like information on creating a medical advance directive? - No - Patient declined No - Patient declined No - Patient declined -  Yes - Educational materials given -    Current Medications (verified) Outpatient Encounter Medications as of 02/27/2021  Medication Sig   aspirin EC 81 MG tablet Take 81 mg by mouth daily.   atorvastatin (LIPITOR) 20 MG tablet Take 1 tablet (20 mg total) by mouth daily.   calcium carbonate (OS-CAL) 600 MG TABS Take 600 mg by mouth daily. AT LUNCH   cholecalciferol (VITAMIN D) 1000 UNITS tablet Take 1,000 Units by mouth daily.    denosumab (PROLIA) 60 MG/ML SOLN injection Inject 60 mg into the skin every 6 (six) months. Administer in upper arm, thigh, or abdomen   lisinopril-hydrochlorothiazide (ZESTORETIC) 10-12.5 MG tablet Take 1 tablet by mouth daily.   Multiple Vitamins-Minerals (CENTRUM SILVER PO) Take 1 tablet by mouth every morning.   Multiple Vitamins-Minerals (PRESERVISION AREDS 2 PO) Take by mouth 2 (two) times daily.   No facility-administered encounter medications on file as of 02/27/2021.    Allergies (verified) Patient has no known allergies.   History: Past Medical History:  Diagnosis Date   Cancer (Laura Acevedo) 1980   melanoma   Cataract    Colon polyps    Foot neuroma 2005   left   Hyperlipidemia    Hypertension    Osteoporosis    Past Surgical History:  Procedure Laterality Date   CATARACT EXTRACTION     FOOT NEUROMA SURGERY Left 2005   Dr Irving Shows   MELANOMA EXCISION Left    leg   Family History  Problem Relation Age of Onset   Diabetes Mother    Heart disease Mother  Heart disease Father    Heart attack Father    Kidney disease Father    Hypertension Sister    Diabetes Brother    Hypertension Brother    Cancer Brother        bladder    Diabetes Brother    Diabetes Brother    Colon cancer Neg Hx    Social History   Socioeconomic History   Marital status: Widowed    Spouse name: Laura Acevedo   Number of children: 0   Years of education: 12   Highest education level: 12th grade  Occupational History   Occupation: retired    Comment: Charity fundraiser  Tobacco  Use   Smoking status: Never   Smokeless tobacco: Never  Vaping Use   Vaping Use: Never used  Substance and Sexual Activity   Alcohol use: No   Drug use: No   Sexual activity: Not Currently  Other Topics Concern   Not on file  Social History Narrative   Husband passed 10/2020   Social Determinants of Health   Financial Resource Strain: Low Risk    Difficulty of Paying Living Expenses: Not hard at all  Food Insecurity: No Food Insecurity   Worried About Charity fundraiser in the Last Year: Never true   [place] in the Last Year: Never true  Transportation Needs: No Transportation Needs   Lack of Transportation (Medical): No   Lack of Transportation (Non-Medical): No  Physical Activity: Insufficiently Active   Days of Exercise per Week: 7 days   Minutes of Exercise per Session: 10 min  Stress: No Stress Concern Present   Feeling of Stress : Not at all  Social Connections: Moderately Integrated   Frequency of Communication with Friends and Family: More than three times a week   Frequency of Social Gatherings with Friends and Family: More than three times a week   Attends Religious Services: More than 4 times per year   Active Member of [employer] or Organizations: Yes   Attends Archivist Meetings: More than 4 times per year   Marital Status: Widowed    Tobacco Counseling Counseling given: Not Answered   Clinical Intake:  Pre-visit preparation completed: Yes  Pain : No/denies pain     BMI - recorded: 19.64 Nutritional Status: BMI of 19-24  Normal Nutritional Risks: None Diabetes: No  How often do you need to have someone help you when you read instructions, pamphlets, or other written materials from your doctor or pharmacy?: 1 - Never  Diabetic? no  Interpreter Needed?: No  Information entered by :: Katrinia Straker, LPN   Activities of Daily Living In your present state of health, do you have any difficulty performing the following activities:  02/27/2021  Hearing? N  Vision? N  Difficulty concentrating or making decisions? N  Walking or climbing stairs? N  Dressing or bathing? N  Doing errands, shopping? N  Preparing Food and eating ? N  Using the Toilet? N  In the past six months, have you accidently leaked urine? Y  Comment very mild - wears pantyliners  Do you have problems with loss of bowel control? N  Managing your Medications? N  Managing your Finances? N  Housekeeping or managing your Housekeeping? N  Some recent data might be hidden    Patient Care Team: Chevis Pretty, FNP as PCP - General (Nurse Practitioner) Calvert Cantor, MD as Consulting Physician (Ophthalmology) Druscilla Brownie, MD as Consulting Physician (Dermatology) Inda Castle, MD (Inactive)  as Consulting Physician (Gastroenterology)  Indicate any recent Medical Services you may have received from other than [facility] providers in the past year (date may be approximate).     Assessment:   This is a routine wellness examination for [facility].  Hearing/Vision screen Hearing Screening - Comments:: Denies hearing difficulties   Vision Screening - Comments:: Wears rx glasses - up to date with routine eye exams with [facility] - she may start going [employer] next time so she won't have to drive to [place] issues and exercise activities discussed: Current Exercise Habits: Home exercise routine, Type of exercise: walking;Other - see comments (house cleaning, yard work), Time (Minutes): 10, Frequency (Times/Week): 7, Weekly Exercise (Minutes/Week): 70, Intensity: Mild, Exercise limited by: None identified   Goals Addressed             This Visit's Progress    DIET - INCREASE WATER INTAKE   On track    Try to drink 6-8 glasses of water daily.     Exercise 3x per week (30 min per time)   Not on track      Depression Screen PHQ 2/9 Scores 02/27/2021 02/26/2021 08/26/2020 02/26/2020 02/21/2020 08/20/2019 02/19/2019  PHQ - 2 Score 3 3 0 0 0 0 0   PHQ- 9 Score 6 6 1  - - - -    Fall Risk Fall Risk  02/27/2021 02/26/2021 02/26/2020 02/21/2020 08/20/2019  Falls in the past year? 0 0 0 0 0  Number falls in past yr: 0 - - 0 -  Injury with Fall? 0 - - 0 -  Risk for fall due to : No Fall Risks - - - -  Follow up Falls prevention discussed - - Falls evaluation completed -  Comment - - - - -    FALL RISK PREVENTION PERTAINING TO THE HOME:  Any stairs in or around the home? Yes  If so, are there any without handrails? No  Home free of loose throw rugs in walkways, pet beds, electrical cords, etc? Yes  Adequate lighting in your home to reduce risk of falls? Yes   ASSISTIVE DEVICES UTILIZED TO PREVENT FALLS:  Life alert? No  Use of a cane, walker or w/c? No  Grab bars in the bathroom? No  Shower chair or bench in shower? Yes  Elevated toilet seat or a handicapped toilet? Yes   TIMED UP AND GO:  Was the test performed? No . Telephonic visit  Cognitive Function: Normal cognitive status assessed by direct observation by this Nurse Health Advisor. No abnormalities found.   MMSE - Mini Mental State Exam 09/29/2017 09/27/2016 08/28/2015 08/28/2015 08/23/2014  Orientation to time 5 5 5 5 5   Orientation to Place 5 5 5 5 5   Registration 3 3 3 3 3   Attention/ Calculation 5 5 5 5 5   Recall 3 3 2 3 3   Language- name 2 objects 2 2 2 2 2   Language- repeat 1 1 1 1 1   Language- follow 3 step command 3 3 3 3 3   Language- read & follow direction 1 1 1 1 1   Write a sentence 1 1 1 1 1   Copy design 1 1 1 1 1   Total score 30 30 29 30 30      6CIT Screen 02/27/2021 02/26/2020 10/03/2018  What Year? 0 points 0 points 0 points  What month? 0 points 0 points 0 points  What time? 0 points 0 points 0 points  Count back from 20 0  points 2 points 0 points  Months in reverse 0 points 0 points 0 points  Repeat phrase 2 points 0 points 0 points  Total Score 2 2 0    Immunizations Immunization History  Administered Date(s) Administered   Fluad Quad(high Dose  65+) 11/03/2018, 11/15/2019, 12/09/2020   Influenza, High Dose Seasonal PF 11/04/2016, 11/07/2017   Influenza,inj,Quad PF,6+ Mos 11/22/2012, 11/05/2013, 10/30/2014, 11/04/2015   Moderna SARS-COV2 Booster Vaccination 02/22/2019   Moderna Sars-Covid-2 Vaccination 03/23/2019, 01/01/2020   Pneumococcal Conjugate-13 11/05/2013   Pneumococcal Polysaccharide-23 11/19/2014    TDAP status: Due, Education has been provided regarding the importance of this vaccine. Advised may receive this vaccine at local pharmacy or Health Dept. Aware to provide a copy of the vaccination record if obtained from local pharmacy or Health Dept. Verbalized acceptance and understanding.  Flu Vaccine status: Up to date  Pneumococcal vaccine status: Up to date  Covid-19 vaccine status: Completed vaccines  Qualifies for Shingles Vaccine? Yes   Zostavax completed No   Shingrix Completed?: No.    Education has been provided regarding the importance of this vaccine. Patient has been advised to call insurance company to determine out of pocket expense if they have not yet received this vaccine. Advised may also receive vaccine at local pharmacy or Health Dept. Verbalized acceptance and understanding.  Screening Tests Health Maintenance  Topic Date Due   COVID-19 Vaccine (3 - Booster for Moderna series) 03/14/2021 (Originally 02/26/2020)   Zoster Vaccines- Shingrix (1 of 2) 05/26/2021 (Originally 06/05/1984)   TETANUS/TDAP  08/26/2021 (Originally 11/18/2012)   DEXA SCAN  02/18/2022   Pneumonia Vaccine 62+ Years old  Completed   INFLUENZA VACCINE  Completed   HPV VACCINES  Aged Out    Health Maintenance  There are no preventive care reminders to display for this patient.   Colorectal cancer screening: No longer required.   Mammogram status: No longer required due to age.  Bone Density status: Completed 02/19/2019. Results reflect: Bone density results: OSTEOPOROSIS. Repeat every 3 years.  Lung Cancer Screening: (Low  Dose CT Chest recommended if Age 47-80 years, 30 pack-year currently smoking OR have quit w/in 15years.) does not qualify.   Additional Screening:  Hepatitis C Screening: does not qualify  Vision Screening: Recommended annual ophthalmology exams for early detection of glaucoma and other disorders of the eye. Is the patient up to date with their annual eye exam?  Yes  Who is the provider or what is the name of the office in which the patient attends annual eye exams? [facility] (may go to [place] next year) If pt is not established with a provider, would they like to be referred to a provider to establish care? No .   Dental Screening: Recommended annual dental exams for proper oral hygiene  Community Resource Referral / Chronic Care Management: CRR required this visit?  No   CCM required this visit?  No      Plan:     I have personally reviewed and noted the following in the patients chart:   Medical and social history Use of alcohol, tobacco or illicit drugs  Current medications and supplements including opioid prescriptions.  Functional ability and status Nutritional status Physical activity Advanced directives List of other physicians Hospitalizations, surgeries, and ER visits in previous 12 months Vitals Screenings to include cognitive, depression, and falls Referrals and appointments  In addition, I have reviewed and discussed with patient certain preventive protocols, quality metrics, and best practice recommendations. A written personalized care plan  for preventive services as well as general preventive health recommendations were provided to patient.     Sandrea Hammond, LPN   0/22/3361   Nurse Notes: None

## 2021-03-26 DIAGNOSIS — Z08 Encounter for follow-up examination after completed treatment for malignant neoplasm: Secondary | ICD-10-CM | POA: Diagnosis not present

## 2021-03-26 DIAGNOSIS — D0372 Melanoma in situ of left lower limb, including hip: Secondary | ICD-10-CM | POA: Diagnosis not present

## 2021-03-26 DIAGNOSIS — Z85820 Personal history of malignant melanoma of skin: Secondary | ICD-10-CM | POA: Diagnosis not present

## 2021-03-26 DIAGNOSIS — D225 Melanocytic nevi of trunk: Secondary | ICD-10-CM | POA: Diagnosis not present

## 2021-03-26 DIAGNOSIS — Z1283 Encounter for screening for malignant neoplasm of skin: Secondary | ICD-10-CM | POA: Diagnosis not present

## 2021-04-08 DIAGNOSIS — D0372 Melanoma in situ of left lower limb, including hip: Secondary | ICD-10-CM | POA: Diagnosis not present

## 2021-04-08 DIAGNOSIS — L988 Other specified disorders of the skin and subcutaneous tissue: Secondary | ICD-10-CM | POA: Diagnosis not present

## 2021-04-23 ENCOUNTER — Encounter: Payer: Self-pay | Admitting: *Deleted

## 2021-05-25 ENCOUNTER — Other Ambulatory Visit: Payer: Self-pay | Admitting: Nurse Practitioner

## 2021-05-25 DIAGNOSIS — I10 Essential (primary) hypertension: Secondary | ICD-10-CM

## 2021-05-25 DIAGNOSIS — E782 Mixed hyperlipidemia: Secondary | ICD-10-CM

## 2021-07-13 DIAGNOSIS — X32XXXD Exposure to sunlight, subsequent encounter: Secondary | ICD-10-CM | POA: Diagnosis not present

## 2021-07-13 DIAGNOSIS — Z08 Encounter for follow-up examination after completed treatment for malignant neoplasm: Secondary | ICD-10-CM | POA: Diagnosis not present

## 2021-07-13 DIAGNOSIS — Z1283 Encounter for screening for malignant neoplasm of skin: Secondary | ICD-10-CM | POA: Diagnosis not present

## 2021-07-13 DIAGNOSIS — D225 Melanocytic nevi of trunk: Secondary | ICD-10-CM | POA: Diagnosis not present

## 2021-07-13 DIAGNOSIS — Z85820 Personal history of malignant melanoma of skin: Secondary | ICD-10-CM | POA: Diagnosis not present

## 2021-07-13 DIAGNOSIS — L570 Actinic keratosis: Secondary | ICD-10-CM | POA: Diagnosis not present

## 2021-08-26 ENCOUNTER — Ambulatory Visit: Payer: Medicare Other | Admitting: Nurse Practitioner

## 2021-08-27 ENCOUNTER — Encounter: Payer: Self-pay | Admitting: Nurse Practitioner

## 2021-08-27 ENCOUNTER — Ambulatory Visit (INDEPENDENT_AMBULATORY_CARE_PROVIDER_SITE_OTHER): Payer: Medicare Other | Admitting: Nurse Practitioner

## 2021-08-27 VITALS — BP 137/64 | HR 77 | Temp 97.6°F | Resp 20 | Ht 65.0 in | Wt 124.0 lb

## 2021-08-27 DIAGNOSIS — I10 Essential (primary) hypertension: Secondary | ICD-10-CM | POA: Diagnosis not present

## 2021-08-27 DIAGNOSIS — M810 Age-related osteoporosis without current pathological fracture: Secondary | ICD-10-CM | POA: Diagnosis not present

## 2021-08-27 DIAGNOSIS — E782 Mixed hyperlipidemia: Secondary | ICD-10-CM

## 2021-08-27 DIAGNOSIS — F411 Generalized anxiety disorder: Secondary | ICD-10-CM | POA: Diagnosis not present

## 2021-08-27 MED ORDER — LISINOPRIL-HYDROCHLOROTHIAZIDE 10-12.5 MG PO TABS: 1.0000 | ORAL_TABLET | Freq: Every day | ORAL | 2 refills | Status: DC

## 2021-08-27 MED ORDER — ATORVASTATIN CALCIUM 20 MG PO TABS: 20.0000 mg | ORAL_TABLET | Freq: Every day | ORAL | 2 refills | Status: DC

## 2021-08-27 NOTE — Patient Instructions (Signed)

## 2021-08-27 NOTE — Progress Notes (Signed)
Subjective:    Patient ID: Laura Acevedo, female    DOB: 07-04-1932, 88 y.o.   MRN: [id number]   Chief Complaint: medical management of chronic issues     HPI:  Laura Acevedo is a 88 y.o. who identifies as a female who was assigned female at birth.   Social history: Lives with: by herself- hisband passed away last 12-10-22 Work history: retired   Scientist, forensic in today for follow up of the following chronic medical issues:  1. Essential hypertension, benign No c/o chest pain, sob or headache. Does not check blood pressure at home. BP Readings from Last 3 Encounters:  08/27/21 137/64  02/26/21 (!) 147/75  08/26/20 136/72      2. Mixed hyperlipidemia Does not check blood pressure at home Lab Results  Component Value Date   CHOL 168 02/26/2021   HDL 93 02/26/2021   LDLCALC 62 02/26/2021   TRIG 68 02/26/2021   CHOLHDL 1.8 02/26/2021   The ASCVD Risk score (Arnett DK, et al., 2019) failed to calculate for the following reasons:   The 2019 ASCVD risk score is only valid for ages 79 to 84   3. GAD (generalized anxiety disorder) Is doing well.    08/27/2021   10:17 AM 02/26/2021   10:31 AM 08/26/2020   10:33 AM 02/21/2020   10:17 AM  GAD 7 : Generalized Anxiety Score  Nervous, Anxious, on Edge 0 1 1 0  Control/stop worrying 0 1 0 2  Worry too much - different things 0 1 1 0  Trouble relaxing 0 1 0 0  Restless 0 0 0 0  Easily annoyed or irritable 0 0 0 0  Afraid - awful might happen 0 0 1 1  Total GAD 7 Score 0 4 3 3   Anxiety Difficulty Not difficult at all Somewhat difficult Somewhat difficult Somewhat difficult     4. Age-related osteoporosis without current pathological fracture Last dexascan was done 02/19/19. Her t score was -2.1. she does not do much weight bearing exercise.   New complaints: None today  No Known Allergies Outpatient Encounter Medications as of 08/27/2021  Medication Sig   aspirin EC 81 MG tablet Take 81 mg by mouth daily.   atorvastatin (LIPITOR)  20 MG tablet TAKE 1 TABLET BY MOUTH DAILY   calcium carbonate (OS-CAL) 600 MG TABS Take 600 mg by mouth daily. AT LUNCH   cholecalciferol (VITAMIN D) 1000 UNITS tablet Take 1,000 Units by mouth daily.    denosumab (PROLIA) 60 MG/ML SOLN injection Inject 60 mg into the skin every 6 (six) months. Administer in upper arm, thigh, or abdomen   lisinopril-hydrochlorothiazide (ZESTORETIC) 10-12.5 MG tablet TAKE 1 TABLET BY MOUTH DAILY   Multiple Vitamins-Minerals (CENTRUM SILVER PO) Take 1 tablet by mouth every morning.   Multiple Vitamins-Minerals (PRESERVISION AREDS 2 PO) Take by mouth 2 (two) times daily.   No facility-administered encounter medications on file as of 08/27/2021.    Past Surgical History:  Procedure Laterality Date   CATARACT EXTRACTION     FOOT NEUROMA SURGERY Left 2005   Dr Irving Shows   MELANOMA EXCISION Left    leg    Family History  Problem Relation Age of Onset   Diabetes Mother    Heart disease Mother    Heart disease Father    Heart attack Father    Kidney disease Father    Hypertension Sister    Diabetes Brother    Hypertension Brother    Cancer Brother  bladder    Diabetes Brother    Diabetes Brother    Colon cancer Neg Hx       Controlled substance contract: n/a   Review of Systems  Constitutional:  Negative for diaphoresis.  Eyes:  Negative for pain.  Respiratory:  Negative for shortness of breath.   Cardiovascular:  Negative for chest pain, palpitations and leg swelling.  Gastrointestinal:  Negative for abdominal pain.  Endocrine: Negative for polydipsia.  Skin:  Negative for rash.  Neurological:  Negative for dizziness, weakness and headaches.  Hematological:  Does not bruise/bleed easily.  All other systems reviewed and are negative.      Objective:   Physical Exam Vitals and nursing note reviewed.  Constitutional:      General: She is not in acute distress.    Appearance: Normal appearance. She is well-developed.  HENT:      Head: Normocephalic.     Right Ear: Tympanic membrane normal.     Left Ear: Tympanic membrane normal.     Nose: Nose normal.     Mouth/Throat:     Mouth: Mucous membranes are moist.  Eyes:     Pupils: Pupils are equal, round, and reactive to light.  Neck:     Vascular: No carotid bruit or JVD.  Cardiovascular:     Rate and Rhythm: Normal rate and regular rhythm.     Heart sounds: Normal heart sounds.  Pulmonary:     Effort: Pulmonary effort is normal. No respiratory distress.     Breath sounds: Normal breath sounds. No wheezing or rales.  Chest:     Chest wall: No tenderness.  Abdominal:     General: Bowel sounds are normal. There is no distension or abdominal bruit.     Palpations: Abdomen is soft. There is no hepatomegaly, splenomegaly, mass or pulsatile mass.     Tenderness: There is no abdominal tenderness.  Musculoskeletal:        General: Normal range of motion.     Cervical back: Normal range of motion and neck supple.  Lymphadenopathy:     Cervical: No cervical adenopathy.  Skin:    General: Skin is warm and dry.  Neurological:     Mental Status: She is alert and oriented to person, place, and time.     Deep Tendon Reflexes: Reflexes are normal and symmetric.  Psychiatric:        Behavior: Behavior normal.        Thought Content: Thought content normal.        Judgment: Judgment normal.    BP 137/64   Pulse 77   Temp 97.6 F (36.4 C) (Temporal)   Resp 20   Ht 5' 5"  (1.651 m)   Wt 124 lb (56.2 kg)   SpO2 100%   BMI 20.63 kg/m         Assessment & Plan:  Laura Acevedo comes in today with chief complaint of Medical Management of Chronic Issues   Diagnosis and orders addressed:  1. Essential hypertension, benign Low sodium diet - lisinopril-hydrochlorothiazide (ZESTORETIC) 10-12.5 MG tablet; Take 1 tablet by mouth daily.  Dispense: 100 tablet; Refill: 2 - CBC with Differential/Platelet - CMP14+EGFR  2. Mixed hyperlipidemia Low fat diet and  exercise - atorvastatin (LIPITOR) 20 MG tablet; Take 1 tablet (20 mg total) by mouth daily.  Dispense: 100 tablet; Refill: 2 - Lipid panel  3. GAD (generalized anxiety disorder) Stress management  4. Age-related osteoporosis without current pathological fracture Weight bearing exercises  Labs pending Health Maintenance reviewed Diet and exercise encouraged  Follow up plan: 6 months   Ezella-Margaret Hassell Done, FNP

## 2021-08-28 ENCOUNTER — Ambulatory Visit (INDEPENDENT_AMBULATORY_CARE_PROVIDER_SITE_OTHER): Payer: Medicare Other

## 2021-08-28 DIAGNOSIS — M810 Age-related osteoporosis without current pathological fracture: Secondary | ICD-10-CM | POA: Diagnosis not present

## 2021-08-28 LAB — LIPID PANEL
Chol/HDL Ratio: 1.7 ratio (ref 0.0–4.4)
Cholesterol, Total: 158 mg/dL (ref 100–199)
HDL: 94 mg/dL (ref >39)
LDL Chol Calc (NIH): 47 mg/dL (ref 0–99)
Triglycerides: 95 mg/dL (ref 0–149)
VLDL Cholesterol Cal: 17 mg/dL (ref 5–40)

## 2021-08-28 LAB — CBC WITH DIFFERENTIAL/PLATELET
Basophils Absolute: 0 10*3/uL (ref 0.0–0.2)
Basos: 1 %
EOS (ABSOLUTE): 0 10*3/uL (ref 0.0–0.4)
Eos: 1 %
Hematocrit: 39.1 % (ref 34.0–46.6)
Hemoglobin: 13.4 g/dL (ref 11.1–15.9)
Immature Grans (Abs): 0 10*3/uL (ref 0.0–0.1)
Immature Granulocytes: 0 %
Lymphocytes Absolute: 1.5 10*3/uL (ref 0.7–3.1)
Lymphs: 23 %
MCH: 32.2 pg (ref 26.6–33.0)
MCHC: 34.3 g/dL (ref 31.5–35.7)
MCV: 94 fL (ref 79–97)
Monocytes Absolute: 0.7 10*3/uL (ref 0.1–0.9)
Monocytes: 11 %
Neutrophils Absolute: 4 10*3/uL (ref 1.4–7.0)
Neutrophils: 64 %
Platelets: 219 10*3/uL (ref 150–450)
RBC: 4.16 x10E6/uL (ref 3.77–5.28)
RDW: 12 % (ref 11.7–15.4)
WBC: 6.3 10*3/uL (ref 3.4–10.8)

## 2021-08-28 LAB — CMP14+EGFR
ALT: 23 IU/L (ref 0–32)
AST: 26 IU/L (ref 0–40)
Albumin/Globulin Ratio: 1.8 (ref 1.2–2.2)
Albumin: 4.4 g/dL (ref 3.7–4.7)
Alkaline Phosphatase: 52 IU/L (ref 44–121)
BUN/Creatinine Ratio: 19 (ref 12–28)
BUN: 21 mg/dL (ref 8–27)
Bilirubin Total: 1 mg/dL (ref 0.0–1.2)
CO2: 26 mmol/L (ref 20–29)
Calcium: 10.5 mg/dL — ABNORMAL HIGH (ref 8.7–10.3)
Chloride: 100 mmol/L (ref 96–106)
Creatinine, Ser: 1.08 mg/dL — ABNORMAL HIGH (ref 0.57–1.00)
Globulin, Total: 2.4 g/dL (ref 1.5–4.5)
Glucose: 108 mg/dL — ABNORMAL HIGH (ref 70–99)
Potassium: 4.8 mmol/L (ref 3.5–5.2)
Sodium: 140 mmol/L (ref 134–144)
Total Protein: 6.8 g/dL (ref 6.0–8.5)
eGFR: 50 mL/min/{1.73_m2} — ABNORMAL LOW (ref >59)

## 2021-08-28 MED ORDER — DENOSUMAB 60 MG/ML ~~LOC~~ SOSY
60.0000 mg | PREFILLED_SYRINGE | Freq: Once | SUBCUTANEOUS | Status: AC
  Administered 2021-08-28: 60 mg via SUBCUTANEOUS

## 2021-08-28 NOTE — Progress Notes (Signed)
Prolia injection given to right arm.  Patient tolerated well. Laura Acevedo

## 2021-10-12 DIAGNOSIS — D225 Melanocytic nevi of trunk: Secondary | ICD-10-CM | POA: Diagnosis not present

## 2021-10-12 DIAGNOSIS — Z85820 Personal history of malignant melanoma of skin: Secondary | ICD-10-CM | POA: Diagnosis not present

## 2021-10-12 DIAGNOSIS — X32XXXD Exposure to sunlight, subsequent encounter: Secondary | ICD-10-CM | POA: Diagnosis not present

## 2021-10-12 DIAGNOSIS — Z08 Encounter for follow-up examination after completed treatment for malignant neoplasm: Secondary | ICD-10-CM | POA: Diagnosis not present

## 2021-10-12 DIAGNOSIS — Z1283 Encounter for screening for malignant neoplasm of skin: Secondary | ICD-10-CM | POA: Diagnosis not present

## 2021-10-12 DIAGNOSIS — L570 Actinic keratosis: Secondary | ICD-10-CM | POA: Diagnosis not present

## 2021-11-06 ENCOUNTER — Ambulatory Visit (INDEPENDENT_AMBULATORY_CARE_PROVIDER_SITE_OTHER): Payer: Medicare Other

## 2021-11-06 DIAGNOSIS — Z23 Encounter for immunization: Secondary | ICD-10-CM | POA: Diagnosis not present

## 2021-11-12 DIAGNOSIS — M79676 Pain in unspecified toe(s): Secondary | ICD-10-CM | POA: Diagnosis not present

## 2021-11-12 DIAGNOSIS — L84 Corns and callosities: Secondary | ICD-10-CM | POA: Diagnosis not present

## 2022-01-25 DIAGNOSIS — Z85820 Personal history of malignant melanoma of skin: Secondary | ICD-10-CM | POA: Diagnosis not present

## 2022-01-25 DIAGNOSIS — Z1283 Encounter for screening for malignant neoplasm of skin: Secondary | ICD-10-CM | POA: Diagnosis not present

## 2022-01-25 DIAGNOSIS — Z08 Encounter for follow-up examination after completed treatment for malignant neoplasm: Secondary | ICD-10-CM | POA: Diagnosis not present

## 2022-01-25 DIAGNOSIS — B078 Other viral warts: Secondary | ICD-10-CM | POA: Diagnosis not present

## 2022-01-25 DIAGNOSIS — D225 Melanocytic nevi of trunk: Secondary | ICD-10-CM | POA: Diagnosis not present

## 2022-02-25 ENCOUNTER — Telehealth: Payer: Self-pay | Admitting: Family Medicine

## 2022-02-25 ENCOUNTER — Telehealth (INDEPENDENT_AMBULATORY_CARE_PROVIDER_SITE_OTHER): Payer: Medicare Other | Admitting: Family Medicine

## 2022-02-25 ENCOUNTER — Encounter: Payer: Self-pay | Admitting: Family Medicine

## 2022-02-25 DIAGNOSIS — J069 Acute upper respiratory infection, unspecified: Secondary | ICD-10-CM

## 2022-02-25 LAB — VERITOR FLU A/B WAIVED
Influenza A: NEGATIVE
Influenza B: NEGATIVE

## 2022-02-25 NOTE — Telephone Encounter (Signed)
Please let the patient know that she is flu negative, the COVID test is still pending and should come back in the next day or 2

## 2022-02-25 NOTE — Progress Notes (Signed)
Virtual Visit via telephone Note  I connected with Laura Acevedo on 02/25/22 at 1100 by telephone and verified that I am speaking with the correct person using two identifiers. Laura Acevedo is currently located at home and patient are currently with her during visit. The provider, Fransisca Kaufmann Livio Ledwith, MD is located in their office at time of visit.  Call ended at 1109  I discussed the limitations, risks, security and privacy concerns of performing an evaluation and management service by telephone and the availability of in person appointments. I also discussed with the patient that there may be a patient responsible charge related to this service. The patient expressed understanding and agreed to proceed.   History and Present Illness: Patient is calling in for sore throat and hoarseness and chills and aches.  She has nasal congestion and watery eyes. She has been exposed to sick people at the funeral about 1 week ago.  She denies SOB or wheezing.  She has a productive cough.  She is using tylenol and mucinex and salt water gargles, and they are helping but not clearing. She did a home covid test and it was negative.  She says she started having symptoms yesterday.  1. Upper respiratory tract infection, unspecified type     Outpatient Encounter Medications as of 02/25/2022  Medication Sig   aspirin EC 81 MG tablet Take 81 mg by mouth daily.   atorvastatin (LIPITOR) 20 MG tablet Take 1 tablet (20 mg total) by mouth daily.   calcium carbonate (OS-CAL) 600 MG TABS Take 600 mg by mouth daily. AT LUNCH   cholecalciferol (VITAMIN D) 1000 UNITS tablet Take 1,000 Units by mouth daily.    denosumab (PROLIA) 60 MG/ML SOLN injection Inject 60 mg into the skin every 6 (six) months. Administer in upper arm, thigh, or abdomen   lisinopril-hydrochlorothiazide (ZESTORETIC) 10-12.5 MG tablet Take 1 tablet by mouth daily.   Multiple Vitamins-Minerals (CENTRUM SILVER PO) Take 1 tablet by mouth every morning.    Multiple Vitamins-Minerals (PRESERVISION AREDS 2 PO) Take by mouth 2 (two) times daily.   No facility-administered encounter medications on file as of 02/25/2022.    Review of Systems  Constitutional:  Positive for chills. Negative for fever.  HENT:  Positive for congestion, postnasal drip, rhinorrhea, sinus pressure, sneezing, sore throat and voice change. Negative for ear discharge and ear pain.   Eyes:  Negative for pain, redness and visual disturbance.  Respiratory:  Positive for cough. Negative for chest tightness, shortness of breath and wheezing.   Cardiovascular:  Negative for chest pain and leg swelling.  Genitourinary:  Negative for difficulty urinating and dysuria.  Musculoskeletal:  Positive for myalgias. Negative for back pain and gait problem.  Skin:  Negative for rash.  Neurological:  Negative for light-headedness and headaches.  Psychiatric/Behavioral:  Negative for agitation and behavioral problems.   All other systems reviewed and are negative.   Observations/Objective: Patient sounds hoarse but otherwise comfortable and in no acute distress  Assessment and Plan: Problem List Items Addressed This Visit   None Visit Diagnoses     Upper respiratory tract infection, unspecified type    -  Primary   Relevant Orders   Novel Coronavirus, NAA ([facility])   Veritor Flu A/B Waived     Continue Mucinex and Tylenol Sinus and salt water gargles  Will test for COVID and flu  Follow up plan: Return if symptoms worsen or fail to improve.     I discussed the assessment  and treatment plan with the patient. The patient was provided an opportunity to ask questions and all were answered. The patient agreed with the plan and demonstrated an understanding of the instructions.   The patient was advised to call back or seek an in-person evaluation if the symptoms worsen or if the condition fails to improve as anticipated.  The above assessment and management plan was discussed  with the patient. The patient verbalized understanding of and has agreed to the management plan. Patient is aware to call the clinic if symptoms persist or worsen. Patient is aware when to return to the clinic for a follow-up visit. Patient educated on when it is appropriate to go to the emergency department.    I provided 9 minutes of non-face-to-face time during this encounter.    Worthy Rancher, MD

## 2022-02-25 NOTE — Telephone Encounter (Signed)
Patient aware.

## 2022-02-26 ENCOUNTER — Other Ambulatory Visit: Payer: Self-pay | Admitting: Family Medicine

## 2022-02-26 LAB — NOVEL CORONAVIRUS, NAA: SARS-CoV-2, NAA: DETECTED — AB

## 2022-02-26 MED ORDER — NIRMATRELVIR/RITONAVIR (PAXLOVID) TABLET (RENAL DOSING): 2.0000 | ORAL_TABLET | Freq: Two times a day (BID) | ORAL | 0 refills | Status: AC

## 2022-02-26 NOTE — Telephone Encounter (Signed)
Dr. Warrick Parisian sent in [place]. Pt made aware.

## 2022-02-26 NOTE — Telephone Encounter (Signed)
Pt tested positive for covid and wants to know if provider is going to send medicine in for her to take?

## 2022-03-01 ENCOUNTER — Ambulatory Visit: Payer: Medicare Other | Admitting: Nurse Practitioner

## 2022-03-02 ENCOUNTER — Ambulatory Visit (INDEPENDENT_AMBULATORY_CARE_PROVIDER_SITE_OTHER): Payer: Medicare Other

## 2022-03-02 VITALS — Ht 65.0 in | Wt 120.0 lb

## 2022-03-02 DIAGNOSIS — Z780 Asymptomatic menopausal state: Secondary | ICD-10-CM

## 2022-03-02 DIAGNOSIS — Z0000 Encounter for general adult medical examination without abnormal findings: Secondary | ICD-10-CM

## 2022-03-02 DIAGNOSIS — Z0100 Encounter for examination of eyes and vision without abnormal findings: Secondary | ICD-10-CM

## 2022-03-02 NOTE — Progress Notes (Signed)
Subjective:   Laura Acevedo is a 89 y.o. female who presents for Medicare Annual (Subsequent) preventive examination. I connected with  [name] on 03/02/22 by a audio enabled telemedicine application and verified that I am speaking with the correct person using two identifiers.  Patient Location: Home  Provider Location: Home Office  I discussed the limitations of evaluation and management by telemedicine. The patient expressed understanding and agreed to proceed.  Review of Systems     Cardiac Risk Factors include: advanced age (>18mn, >>30women)     Objective:    Today's Vitals   03/02/22 1445  Weight: 120 lb (54.4 kg)  Height: 5' 5"$  (1.651 m)   Body mass index is 19.97 kg/m.     03/02/2022    2:50 PM 02/27/2021    4:24 PM 02/26/2020    1:26 PM 10/03/2018   11:20 AM 09/29/2017   11:01 AM 08/28/2015   10:18 AM 08/23/2014   12:09 PM  Advanced Directives  Does Patient Have a Medical Advance Directive? Yes Yes No No No Yes No  Type of [place]aramed[place]of [place] will [place] will    [place] will   Does patient want to make changes to medical advance directive? No - Patient declined     No - Patient declined   Copy of [place] Chart? Yes - validated most recent copy scanned in chart (See row information) Yes - validated most recent copy scanned in chart (See row information)    Yes   Would patient like information on creating a medical advance directive?   No - Patient declined No - Patient declined No - Patient declined  Yes - Educational materials given    Current Medications (verified) Outpatient Encounter Medications as of 03/02/2022  Medication Sig   aspirin EC 81 MG tablet Take 81 mg by mouth daily.   atorvastatin (LIPITOR) 20 MG tablet Take 1 tablet (20 mg total) by mouth daily.   calcium carbonate (OS-CAL) 600 MG TABS Take 600 mg by mouth daily. AT LUNCH    cholecalciferol (VITAMIN D) 1000 UNITS tablet Take 1,000 Units by mouth daily.    denosumab (PROLIA) 60 MG/ML SOLN injection Inject 60 mg into the skin every 6 (six) months. Administer in upper arm, thigh, or abdomen   lisinopril-hydrochlorothiazide (ZESTORETIC) 10-12.5 MG tablet Take 1 tablet by mouth daily.   Multiple Vitamins-Minerals (CENTRUM SILVER PO) Take 1 tablet by mouth every morning.   Multiple Vitamins-Minerals (PRESERVISION AREDS 2 PO) Take by mouth 2 (two) times daily.   nirmatrelvir/ritonavir, renal dosing, (PAXLOVID) 10 x 150 MG & 10 x 100MG TABS Take 2 tablets by mouth 2 (two) times daily for 5 days. (Take nirmatrelvir 150 mg one tablet twice daily for 5 days and ritonavir 100 mg one tablet twice daily for 5 days) Patient GFR is 50   No facility-administered encounter medications on file as of 03/02/2022.    Allergies (verified) Patient has no known allergies.   History: Past Medical History:  Diagnosis Date   Cancer ([place] 1980   melanoma   Cataract    Colon polyps    Foot neuroma 2005   left   Hyperlipidemia    Hypertension    Osteoporosis    Past Surgical History:  Procedure Laterality Date   CATARACT EXTRACTION     FOOT NEUROMA SURGERY Left 2005   Dr DIrving Shows  MELANOMA EXCISION Left  leg   Family History  Problem Relation Age of Onset   Diabetes Mother    Heart disease Mother    Heart disease Father    Heart attack Father    Kidney disease Father    Hypertension Sister    Diabetes Brother    Hypertension Brother    Cancer Brother        bladder    Diabetes Brother    Diabetes Brother    Colon cancer Neg Hx    Social History   Socioeconomic History   Marital status: Widowed    Spouse name: John   Number of children: 0   Years of education: 12   Highest education level: 12th grade  Occupational History   Occupation: retired    Comment: Charity fundraiser  Tobacco Use   Smoking status: Never   Smokeless tobacco: Never  Vaping Use   Vaping Use:  Never used  Substance and Sexual Activity   Alcohol use: No   Drug use: No   Sexual activity: Not Currently  Other Topics Concern   Not on file  Social History Narrative   Husband passed 10/2020   Social Determinants of Health   Financial Resource Strain: Low Risk  (03/02/2022)   Overall Financial Resource Strain (CARDIA)    Difficulty of Paying Living Expenses: Not hard at all  Food Insecurity: No Food Insecurity (03/02/2022)   Hunger Vital Sign    Worried About Running Out of Food in the Last Year: Never true    [place] in the Last Year: Never true  Transportation Needs: No Transportation Needs (03/02/2022)   PRAPARE - Hydrologist (Medical): No    Lack of Transportation (Non-Medical): No  Physical Activity: Insufficiently Active (03/02/2022)   Exercise Vital Sign    Days of Exercise per Week: 3 days    Minutes of Exercise per Session: 30 min  Stress: No Stress Concern Present (03/02/2022)   [place]    Feeling of Stress : Not at all  Social Connections: Moderately Isolated (03/02/2022)   Social Connection and Isolation Panel [NHANES]    Frequency of Communication with Friends and Family: More than three times a week    Frequency of Social Gatherings with Friends and Family: More than three times a week    Attends Religious Services: More than 4 times per year    Active Member of [employer] or Organizations: No    Attends Archivist Meetings: Never    Marital Status: Widowed    Tobacco Counseling Counseling given: Not Answered   Clinical Intake:  Pre-visit preparation completed: Yes  Pain : No/denies pain     Nutritional Risks: None Diabetes: No  How often do you need to have someone help you when you read instructions, pamphlets, or other written materials from your doctor or pharmacy?: 1 - Never  Diabetic?no   Interpreter Needed?: No  Information  entered by :: Jadene Pierini, LPN   Activities of Daily Living    03/02/2022    2:50 PM  In your present state of health, do you have any difficulty performing the following activities:  Hearing? 0  Vision? 0  Difficulty concentrating or making decisions? 0  Walking or climbing stairs? 0  Dressing or bathing? 0  Doing errands, shopping? 0  Preparing Food and eating ? N  Using the Toilet? N  In the past six months, have you accidently leaked  urine? N  Do you have problems with loss of bowel control? N  Managing your Medications? N  Managing your Finances? N  Housekeeping or managing your Housekeeping? N    Patient Care Team: Chevis Pretty, FNP as PCP - General (Nurse Practitioner) Calvert Cantor, MD as Consulting Physician (Ophthalmology) Druscilla Brownie, MD as Consulting Physician (Dermatology) Inda Castle, MD (Inactive) as Consulting Physician (Gastroenterology)  Indicate any recent Medical Services you may have received from other than [facility] providers in the past year (date may be approximate).     Assessment:   This is a routine wellness examination for [facility].  Hearing/Vision screen Vision Screening - Comments:: Referral 03/02/2022  Dietary issues and exercise activities discussed: Current Exercise Habits: Home exercise routine, Type of exercise: walking, Time (Minutes): 30, Frequency (Times/Week): 3, Weekly Exercise (Minutes/Week): 90, Intensity: Mild, Exercise limited by: orthopedic condition(s)   Goals Addressed             This Visit's Progress    DIET - INCREASE WATER INTAKE   On track    Try to drink 6-8 glasses of water daily.       Depression Screen    03/02/2022    2:49 PM 08/27/2021   10:17 AM 02/27/2021    4:22 PM 02/26/2021   10:31 AM 08/26/2020   10:33 AM 02/26/2020    1:26 PM 02/21/2020   10:08 AM  PHQ 2/9 Scores  PHQ - 2 Score 0 0 3 3 0 0 0  PHQ- 9 Score  0 6 6 1      $ Fall Risk    03/02/2022    2:48 PM 08/27/2021   10:17 AM  02/27/2021    2:58 PM 02/26/2021   10:31 AM 02/26/2020    1:26 PM  Fall Risk   Falls in the past year? 0 0 0 0 0  Number falls in past yr: 0  0    Injury with Fall? 0  0    Risk for fall due to : No Fall Risks  No Fall Risks    Follow up Falls prevention discussed  Falls prevention discussed      FALL RISK PREVENTION PERTAINING TO THE HOME:  Any stairs in or around the home? No  If so, are there any without handrails? No  Home free of loose throw rugs in walkways, pet beds, electrical cords, etc? Yes  Adequate lighting in your home to reduce risk of falls? Yes   ASSISTIVE DEVICES UTILIZED TO PREVENT FALLS:  Life alert? No  Use of a cane, walker or w/c? No  Grab bars in the bathroom? Yes  Shower chair or bench in shower? No  Elevated toilet seat or a handicapped toilet? No       09/29/2017   11:12 AM 09/27/2016   11:26 AM 08/28/2015   10:45 AM 08/28/2015   10:30 AM 08/23/2014   12:17 PM  MMSE - Mini Mental State Exam  Orientation to time 5 5 5 5 5  $ Orientation to Place 5 5 5 5 5  $ Registration 3 3 3 3 3  $ Attention/ Calculation 5 5 5 5 5  $ Recall 3 3 2 3 3  $ Language- name 2 objects 2 2 2 2 2  $ Language- repeat 1 1 1 1 1  $ Language- follow 3 step command 3 3 3 3 3  $ Language- read & follow direction 1 1 1 1 1  $ Write a sentence 1 1 1 1 1  $ Copy design 1 1 1  1 1  Total score 30 30 29 30 30        $ 03/02/2022    2:50 PM 02/27/2021    3:02 PM 02/26/2020    1:29 PM 10/03/2018   11:24 AM  6CIT Screen  What Year? 0 points 0 points 0 points 0 points  What month? 0 points 0 points 0 points 0 points  What time? 0 points 0 points 0 points 0 points  Count back from 20 0 points 0 points 2 points 0 points  Months in reverse 0 points 0 points 0 points 0 points  Repeat phrase 0 points 2 points 0 points 0 points  Total Score 0 points 2 points 2 points 0 points    Immunizations Immunization History  Administered Date(s) Administered   Fluad Quad(high Dose 65+) 11/03/2018, 11/15/2019,  12/09/2020, 11/06/2021   Influenza, High Dose Seasonal PF 11/04/2016, 11/07/2017   Influenza,inj,Quad PF,6+ Mos 11/22/2012, 11/05/2013, 10/30/2014, 11/04/2015   Moderna SARS-COV2 Booster Vaccination 02/22/2019   Moderna Sars-Covid-2 Vaccination 03/23/2019, 01/01/2020   Pneumococcal Conjugate-13 11/05/2013   Pneumococcal Polysaccharide-23 11/19/2014    TDAP status: Due, Education has been provided regarding the importance of this vaccine. Advised may receive this vaccine at local pharmacy or Health Dept. Aware to provide a copy of the vaccination record if obtained from local pharmacy or Health Dept. Verbalized acceptance and understanding.  Flu Vaccine status: Up to date  Pneumococcal vaccine status: Up to date  Covid-19 vaccine status: Completed vaccines  Qualifies for Shingles Vaccine? Yes   Zostavax completed No   Shingrix Completed?: No.    Education has been provided regarding the importance of this vaccine. Patient has been advised to call insurance company to determine out of pocket expense if they have not yet received this vaccine. Advised may also receive vaccine at local pharmacy or Health Dept. Verbalized acceptance and understanding.  Screening Tests Health Maintenance  Topic Date Due   DTaP/Tdap/Td (1 - Tdap) Never done   Zoster Vaccines- Shingrix (1 of 2) Never done   COVID-19 Vaccine (4 - 2023-24 season) 09/18/2021   DEXA SCAN  02/18/2022   Medicare Annual Wellness (AWV)  03/03/2023   Pneumonia Vaccine 54+ Years old  Completed   INFLUENZA VACCINE  Completed   HPV VACCINES  Aged Out    Health Maintenance  Health Maintenance Due  Topic Date Due   DTaP/Tdap/Td (1 - Tdap) Never done   Zoster Vaccines- Shingrix (1 of 2) Never done   COVID-19 Vaccine (4 - 2023-24 season) 09/18/2021   DEXA SCAN  02/18/2022    Colorectal cancer screening: No longer required.   Mammogram status: No longer required due to age.  Bone Density status: Ordered 03/02/2022. Pt provided  with contact info and advised to call to schedule appt.  Lung Cancer Screening: (Low Dose CT Chest recommended if Age 48-80 years, 30 pack-year currently smoking OR have quit w/in 15years.) does not qualify.   Lung Cancer Screening Referral: n/a  Additional Screening:  Hepatitis C Screening: does not qualify;  Vision Screening: Recommended annual ophthalmology exams for early detection of glaucoma and other disorders of the eye. Is the patient up to date with their annual eye exam?  Yes  Who is the provider or what is the name of the office in which the patient attends annual eye exams? Dr.Lee  If pt is not established with a provider, would they like to be referred to a provider to establish care? No .   Dental Screening: Recommended annual  dental exams for proper oral hygiene  Community Resource Referral / Chronic Care Management: CRR required this visit?  No   CCM required this visit?  No      Plan:     I have personally reviewed and noted the following in the patient's chart:   Medical and social history Use of alcohol, tobacco or illicit drugs  Current medications and supplements including opioid prescriptions. Patient is not currently taking opioid prescriptions. Functional ability and status Nutritional status Physical activity Advanced directives List of other physicians Hospitalizations, surgeries, and ER visits in previous 12 months Vitals Screenings to include cognitive, depression, and falls Referrals and appointments  In addition, I have reviewed and discussed with patient certain preventive protocols, quality metrics, and best practice recommendations. A written personalized care plan for preventive services as well as general preventive health recommendations were provided to patient.     Daphane Shepherd, LPN   [id number]   Nurse Notes: Due TDAP Vaccine

## 2022-03-02 NOTE — Patient Instructions (Signed)
Laura Acevedo , Thank you for taking time to come for your Medicare Wellness Visit. I appreciate your ongoing commitment to your health goals. Please review the following plan we discussed and let me know if I can assist you in the future.   These are the goals we discussed:  Goals       DIET - INCREASE WATER INTAKE      Try to drink 6-8 glasses of water daily.      Exercise 3x per week (30 min per time)      Patient Stated (pt-stated)      I would like to clean out all of my old clothes and shoes in the house        This is a list of the screening recommended for you and due dates:  Health Maintenance  Topic Date Due   DTaP/Tdap/Td vaccine (1 - Tdap) Never done   Zoster (Shingles) Vaccine (1 of 2) Never done   COVID-19 Vaccine (4 - 2023-24 season) 09/18/2021   DEXA scan (bone density measurement)  02/18/2022   Medicare Annual Wellness Visit  03/03/2023   Pneumonia Vaccine  Completed   Flu Shot  Completed   HPV Vaccine  Aged Out    Advanced directives: Advance directive discussed with you today. I have provided a copy for you to complete at home and have notarized. Once this is complete please bring a copy in to our office so we can scan it into your chart.   Conditions/risks identified: Aim for 30 minutes of exercise or brisk walking, 6-8 glasses of water, and 5 servings of fruits and vegetables each day.   Next appointment: Follow up in one year for your annual wellness visit    Preventive Care 65 Years and Older, Female Preventive care refers to lifestyle choices and visits with your health care provider that can promote health and wellness. What does preventive care include? A yearly physical exam. This is also called an annual well check. Dental exams once or twice a year. Routine eye exams. Ask your health care provider how often you should have your eyes checked. Personal lifestyle choices, including: Daily care of your teeth and gums. Regular physical activity. Eating  a healthy diet. Avoiding tobacco and drug use. Limiting alcohol use. Practicing safe sex. Taking low-dose aspirin every day. Taking vitamin and mineral supplements as recommended by your health care provider. What happens during an annual well check? The services and screenings done by your health care provider during your annual well check will depend on your age, overall health, lifestyle risk factors, and family history of disease. Counseling  Your health care provider may ask you questions about your: Alcohol use. Tobacco use. Drug use. Emotional well-being. Home and relationship well-being. Sexual activity. Eating habits. History of falls. Memory and ability to understand (cognition). Work and work Statistician. Reproductive health. Screening  You may have the following tests or measurements: Height, weight, and BMI. Blood pressure. Lipid and cholesterol levels. These may be checked every 5 years, or more frequently if you are over 8 years old. Skin check. Lung cancer screening. You may have this screening every year starting at age 24 if you have a 30-pack-year history of smoking and currently smoke or have quit within the past 15 years. Fecal occult blood test (FOBT) of the stool. You may have this test every year starting at age 23. Flexible sigmoidoscopy or colonoscopy. You may have a sigmoidoscopy every 5 years or a colonoscopy every 10 years  starting at age 27. Hepatitis C blood test. Hepatitis B blood test. Sexually transmitted disease (STD) testing. Diabetes screening. This is done by checking your blood sugar (glucose) after you have not eaten for a while (fasting). You may have this done every 1-3 years. Bone density scan. This is done to screen for osteoporosis. You may have this done starting at age 78. Mammogram. This may be done every 1-2 years. Talk to your health care provider about how often you should have regular mammograms. Talk with your health care  provider about your test results, treatment options, and if necessary, the need for more tests. Vaccines  Your health care provider may recommend certain vaccines, such as: Influenza vaccine. This is recommended every year. Tetanus, diphtheria, and acellular pertussis (Tdap, Td) vaccine. You may need a Td booster every 10 years. Zoster vaccine. You may need this after age 84. Pneumococcal 13-valent conjugate (PCV13) vaccine. One dose is recommended after age 87. Pneumococcal polysaccharide (PPSV23) vaccine. One dose is recommended after age 5. Talk to your health care provider about which screenings and vaccines you need and how often you need them. This information is not intended to replace advice given to you by your health care provider. Make sure you discuss any questions you have with your health care provider. Document Released: 01/31/2015 Document Revised: 09/24/2015 Document Reviewed: 11/05/2014 Elsevier Interactive Patient Education  2017 [place] Prevention in the Home Falls can cause injuries. They can happen to people of all ages. There are many things you can do to make your home safe and to help prevent falls. What can I do on the outside of my home? Regularly fix the edges of walkways and driveways and fix any cracks. Remove anything that might make you trip as you walk through a door, such as a raised step or threshold. Trim any bushes or trees on the path to your home. Use bright outdoor lighting. Clear any walking paths of anything that might make someone trip, such as rocks or tools. Regularly check to see if handrails are loose or broken. Make sure that both sides of any steps have handrails. Any raised decks and porches should have guardrails on the edges. Have any leaves, snow, or ice cleared regularly. Use sand or salt on walking paths during winter. Clean up any spills in your garage right away. This includes oil or grease spills. What can I do in the  bathroom? Use night lights. Install grab bars by the toilet and in the tub and shower. Do not use towel bars as grab bars. Use non-skid mats or decals in the tub or shower. If you need to sit down in the shower, use a plastic, non-slip stool. Keep the floor dry. Clean up any water that spills on the floor as soon as it happens. Remove soap buildup in the tub or shower regularly. Attach bath mats securely with double-sided non-slip rug tape. Do not have throw rugs and other things on the floor that can make you trip. What can I do in the bedroom? Use night lights. Make sure that you have a light by your bed that is easy to reach. Do not use any sheets or blankets that are too big for your bed. They should not hang down onto the floor. Have a firm chair that has side arms. You can use this for support while you get dressed. Do not have throw rugs and other things on the floor that can make you trip. What  can I do in the kitchen? Clean up any spills right away. Avoid walking on wet floors. Keep items that you use a lot in easy-to-reach places. If you need to reach something above you, use a strong step stool that has a grab bar. Keep electrical cords out of the way. Do not use floor polish or wax that makes floors slippery. If you must use wax, use non-skid floor wax. Do not have throw rugs and other things on the floor that can make you trip. What can I do with my stairs? Do not leave any items on the stairs. Make sure that there are handrails on both sides of the stairs and use them. Fix handrails that are broken or loose. Make sure that handrails are as long as the stairways. Check any carpeting to make sure that it is firmly attached to the stairs. Fix any carpet that is loose or worn. Avoid having throw rugs at the top or bottom of the stairs. If you do have throw rugs, attach them to the floor with carpet tape. Make sure that you have a light switch at the top of the stairs and the  bottom of the stairs. If you do not have them, ask someone to add them for you. What else can I do to help prevent falls? Wear shoes that: Do not have high heels. Have rubber bottoms. Are comfortable and fit you well. Are closed at the toe. Do not wear sandals. If you use a stepladder: Make sure that it is fully opened. Do not climb a closed stepladder. Make sure that both sides of the stepladder are locked into place. Ask someone to hold it for you, if possible. Clearly mark and make sure that you can see: Any grab bars or handrails. First and last steps. Where the edge of each step is. Use tools that help you move around (mobility aids) if they are needed. These include: Canes. Walkers. Scooters. Crutches. Turn on the lights when you go into a dark area. Replace any light bulbs as soon as they burn out. Set up your furniture so you have a clear path. Avoid moving your furniture around. If any of your floors are uneven, fix them. If there are any pets around you, be aware of where they are. Review your medicines with your doctor. Some medicines can make you feel dizzy. This can increase your chance of falling. Ask your doctor what other things that you can do to help prevent falls. This information is not intended to replace advice given to you by your health care provider. Make sure you discuss any questions you have with your health care provider. Document Released: 10/31/2008 Document Revised: 06/12/2015 Document Reviewed: 02/08/2014 Elsevier Interactive Patient Education  2017 [employer].

## 2022-03-09 ENCOUNTER — Encounter: Payer: Self-pay | Admitting: Nurse Practitioner

## 2022-03-09 ENCOUNTER — Ambulatory Visit (INDEPENDENT_AMBULATORY_CARE_PROVIDER_SITE_OTHER): Payer: Medicare Other | Admitting: Nurse Practitioner

## 2022-03-09 VITALS — BP 119/66 | HR 80 | Temp 98.3°F | Resp 20 | Ht 65.0 in | Wt 117.0 lb

## 2022-03-09 DIAGNOSIS — Z0000 Encounter for general adult medical examination without abnormal findings: Secondary | ICD-10-CM

## 2022-03-09 DIAGNOSIS — R1314 Dysphagia, pharyngoesophageal phase: Secondary | ICD-10-CM | POA: Diagnosis not present

## 2022-03-09 DIAGNOSIS — F411 Generalized anxiety disorder: Secondary | ICD-10-CM

## 2022-03-09 DIAGNOSIS — Z0001 Encounter for general adult medical examination with abnormal findings: Secondary | ICD-10-CM | POA: Diagnosis not present

## 2022-03-09 DIAGNOSIS — E782 Mixed hyperlipidemia: Secondary | ICD-10-CM

## 2022-03-09 DIAGNOSIS — M810 Age-related osteoporosis without current pathological fracture: Secondary | ICD-10-CM | POA: Diagnosis not present

## 2022-03-09 DIAGNOSIS — I10 Essential (primary) hypertension: Secondary | ICD-10-CM

## 2022-03-09 MED ORDER — ATORVASTATIN CALCIUM 20 MG PO TABS: 20.0000 mg | ORAL_TABLET | Freq: Every day | ORAL | 2 refills | Status: DC

## 2022-03-09 MED ORDER — LISINOPRIL-HYDROCHLOROTHIAZIDE 10-12.5 MG PO TABS: 1.0000 | ORAL_TABLET | Freq: Every day | ORAL | 2 refills | Status: DC

## 2022-03-09 NOTE — Patient Instructions (Signed)
Dysphagia  Dysphagia is trouble swallowing. This condition occurs when solids and liquids stick in a person's throat on the way down to the stomach, or when food takes longer to get to the stomach than usual. You may have problems swallowing food, liquids, or both. You may also have pain while trying to swallow. It may take you more time and effort to swallow something. What are the causes? This condition may be caused by: Muscle problems. These may make it difficult for you to move food and liquids through the esophagus, which is the tube that connects your mouth to your stomach. Blockages. You may have ulcers, scar tissue, or inflammation that blocks the normal passage of food and liquids. Causes of these problems include: Acid reflux from your stomach into your esophagus (gastroesophageal reflux). Infections. Radiation treatment for cancer. Medicines taken without enough fluids to wash them down into your stomach. Stroke. This can affect the nerves and make it difficult to swallow. Nerve problems. These prevent signals from being sent to the muscles of your esophagus to squeeze (contract) and move what you swallow down to your stomach. Globus pharyngeus. This is a common problem that involves a feeling like something is stuck in your throat or a sense of trouble with swallowing, even though nothing is wrong with the swallowing passages. Certain conditions, such as cerebral palsy or Parkinson's disease. What are the signs or symptoms? Common symptoms of this condition include: A feeling that solids or liquids are stuck in your throat on the way down to the stomach. Pain while swallowing. Coughing or gagging while trying to swallow. Other symptoms include: Food moving back from your stomach to your mouth (regurgitation). Noises coming from your throat. Chest discomfort when swallowing. A feeling of fullness when swallowing. Drooling, especially when the throat is blocked. Heartburn. How  is this diagnosed? This condition may be diagnosed by: Barium swallow X-ray. In this test, you will swallow a white liquid that sticks to the inside of your esophagus. X-ray images are then taken. Endoscopy. In this test, a flexible telescope is inserted down your throat to look at your esophagus and your stomach. CT scans or an MRI. How is this treated? Treatment for dysphagia depends on the cause of this condition: If the dysphagia is caused by acid reflux or infection, medicines may be used. These may include antibiotics or heartburn medicines. If the dysphagia is caused by problems with the muscles, swallowing therapy may be used to help you strengthen your swallowing muscles. You may have to do specific exercises to strengthen the muscles or stretch them. If the dysphagia is caused by a blockage or mass, procedures to remove the blockage may be done. You may need surgery and a feeding tube. You may need to make diet changes. Ask your health care provider for specific instructions. Follow these instructions at home: Medicines Take over-the-counter and prescription medicines only as told by your health care provider. If you were prescribed an antibiotic medicine, take it as told by your health care provider. Do not stop taking the antibiotic even if you start to feel better. Eating and drinking  Make any diet changes as told by your health care provider. Work with a diet and nutrition specialist (dietitian) to create an eating plan that will help you get the nutrients you need in order to stay healthy. Eat soft foods that are easier to swallow. Cut your food into small pieces and eat slowly. Take small bites. Eat and drink only when you  are sitting upright. Do not drink alcohol or caffeine. If you need help quitting, ask your health care provider. General instructions Check your weight every day to make sure you are not losing weight. Do not use any products that contain nicotine or  tobacco. These products include cigarettes, chewing tobacco, and vaping devices, such as e-cigarettes. If you need help quitting, ask your health care provider. Keep all follow-up visits. This is important. Contact a health care provider if: You lose weight because you cannot swallow. You cough when you drink liquids. You cough up partially digested food. Get help right away if: You cannot swallow your saliva. You have shortness of breath, a fever, or both. Your voice is hoarse and you have trouble swallowing. These symptoms may represent a serious problem that is an emergency. Do not wait to see if the symptoms will go away. Get medical help right away. Call your local emergency services (911 in the [place] Do not drive yourself to the hospital. Summary Dysphagia is trouble swallowing. This condition occurs when solids and liquids stick in a person's throat on the way down to the stomach. You may cough or gag while trying to swallow. Dysphagia has many possible causes. Treatment for dysphagia depends on the cause of the condition. Keep all follow-up visits. This is important. This information is not intended to replace advice given to you by your health care provider. Make sure you discuss any questions you have with your health care provider. Document Revised: 08/25/2019 Document Reviewed: 08/25/2019 Elsevier Patient Education  [place].

## 2022-03-09 NOTE — Progress Notes (Signed)
Subjective:    Patient ID: Laura Acevedo, female    DOB: [birth year], 89 y.o.   MRN: [id number]   Chief Complaint: Annual Exam    HPI:  Laura Acevedo is a 89 y.o. who identifies as a female who was assigned female at birth.   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 Essential hypertension, benign No c/o chest pain, sob or headache. Doe snot check blood pressure at home. BP Readings from Last 3 Encounters:  03/09/22 119/66  08/27/21 137/64  02/26/21 (!) 147/75     2. Mixed hyperlipidemia Does watch diet but does no real exercise  3. GAD (generalized anxiety disorder)    03/09/2022   10:55 AM 08/27/2021   10:17 AM 02/26/2021   10:31 AM 08/26/2020   10:33 AM  GAD 7 : Generalized Anxiety Score  Nervous, Anxious, on Edge 0 0 1 1  Control/stop worrying 0 0 1 0  Worry too much - different things 0 0 1 1  Trouble relaxing 0 0 1 0  Restless 0 0 0 0  Easily annoyed or irritable 0 0 0 0  Afraid - awful might happen 0 0 0 1  Total GAD 7 Score 0 0 4 3  Anxiety Difficulty Not difficult at all Not difficult at all Somewhat difficult Somewhat difficult      4. Age-related osteoporosis without current pathological fracture No longer doing bone density tests   New complaints: Having trouble swallowing hr pills. Feels like her pills get hung in her throat. She is not having any trouble with foods or drinks.   No Known Allergies Outpatient Encounter Medications as of 03/09/2022  Medication Sig   aspirin EC 81 MG tablet Take 81 mg by mouth daily.   atorvastatin (LIPITOR) 20 MG tablet Take 1 tablet (20 mg total) by mouth daily.   calcium carbonate (OS-CAL) 600 MG TABS Take 600 mg by mouth daily. AT LUNCH   cholecalciferol (VITAMIN D) 1000 UNITS tablet Take 1,000 Units by mouth daily.    denosumab (PROLIA) 60 MG/ML SOLN injection Inject 60 mg into the skin every 6 (six) months.  Administer in upper arm, thigh, or abdomen   lisinopril-hydrochlorothiazide (ZESTORETIC) 10-12.5 MG tablet Take 1 tablet by mouth daily.   Multiple Vitamins-Minerals (CENTRUM SILVER PO) Take 1 tablet by mouth every morning.   Multiple Vitamins-Minerals (PRESERVISION AREDS 2 PO) Take by mouth 2 (two) times daily.   No facility-administered encounter medications on file as of 03/09/2022.    Past Surgical History:  Procedure Laterality Date   CATARACT EXTRACTION     FOOT NEUROMA SURGERY Left 2005   Dr Irving Shows   MELANOMA EXCISION Left    leg    Family History  Problem Relation Age of Onset   Diabetes Mother    Heart disease Mother    Heart disease Father    Heart attack Father    Kidney disease Father    Hypertension Sister    Diabetes Brother    Hypertension Brother    Cancer Brother        bladder    Diabetes Brother    Diabetes Brother    Colon cancer Neg Hx       Controlled substance contract: n/a     Review of Systems  Constitutional:  Negative for diaphoresis.  Eyes:  Negative for pain.  Respiratory:  Negative for shortness  of breath.   Cardiovascular:  Negative for chest pain, palpitations and leg swelling.  Gastrointestinal:  Negative for abdominal pain.  Endocrine: Negative for polydipsia.  Skin:  Negative for rash.  Neurological:  Negative for dizziness, weakness and headaches.  Hematological:  Does not bruise/bleed easily.  All other systems reviewed and are negative.      Objective:   Physical Exam Vitals and nursing note reviewed.  Constitutional:      General: She is not in acute distress.    Appearance: Normal appearance. She is well-developed.  HENT:     Head: Normocephalic.     Right Ear: Tympanic membrane normal.     Left Ear: Tympanic membrane normal.     Nose: Nose normal.     Mouth/Throat:     Mouth: Mucous membranes are moist.  Eyes:     Pupils: Pupils are equal, round, and reactive to light.  Neck:     Vascular: No carotid bruit  or JVD.  Cardiovascular:     Rate and Rhythm: Normal rate and regular rhythm.     Heart sounds: Normal heart sounds.  Pulmonary:     Effort: Pulmonary effort is normal. No respiratory distress.     Breath sounds: Normal breath sounds. No wheezing or rales.  Chest:     Chest wall: No tenderness.  Abdominal:     General: Bowel sounds are normal. There is no distension or abdominal bruit.     Palpations: Abdomen is soft. There is no hepatomegaly, splenomegaly, mass or pulsatile mass.     Tenderness: There is no abdominal tenderness.  Musculoskeletal:        General: Normal range of motion.     Cervical back: Normal range of motion and neck supple.  Lymphadenopathy:     Cervical: No cervical adenopathy.  Skin:    General: Skin is warm and dry.  Neurological:     Mental Status: She is alert and oriented to person, place, and time.     Deep Tendon Reflexes: Reflexes are normal and symmetric.  Psychiatric:        Behavior: Behavior normal.        Thought Content: Thought content normal.        Judgment: Judgment normal.    BP 119/66   Pulse 80   Temp 98.3 F (36.8 C) (Temporal)   Resp 20   Ht 5' 5"$  (1.651 m)   Wt 117 lb (53.1 kg)   SpO2 99%   BMI 19.47 kg/m         Assessment & Plan:  Laura Acevedo in today with chief complaint of Annual Exam   1. Annual physical exam  2. Essential hypertension, benign Low sodium diet - CBC with Differential/Platelet - CMP14+EGFR - lisinopril-hydrochlorothiazide (ZESTORETIC) 10-12.5 MG tablet; Take 1 tablet by mouth daily.  Dispense: 100 tablet; Refill: 2  3. Mixed hyperlipidemia Low fat diet - Lipid panel - atorvastatin (LIPITOR) 20 MG tablet; Take 1 tablet (20 mg total) by mouth daily.  Dispense: 100 tablet; Refill: 2  4. GAD (generalized anxiety disorder) Stress management  5. Age-related osteoporosis without current pathological fracture Weight bearing exercises as can tolerate  6. Pharyngoesophageal dysphagia Break  pills in half and swallow 1 at at time - Ambulatory referral to Gastroenterology    The above assessment and management plan was discussed with the patient. The patient verbalized understanding of and has agreed to the management plan. Patient is aware to call the clinic if symptoms persist  or worsen. Patient is aware when to return to the clinic for a follow-up visit. Patient educated on when it is appropriate to go to the emergency department.   [place]-Margaret Hassell Done, FNP

## 2022-03-10 ENCOUNTER — Telehealth: Payer: Self-pay | Admitting: Internal Medicine

## 2022-03-10 LAB — CMP14+EGFR
ALT: 37 IU/L — ABNORMAL HIGH (ref 0–32)
AST: 22 IU/L (ref 0–40)
Albumin/Globulin Ratio: 1.8 (ref 1.2–2.2)
Albumin: 4.2 g/dL (ref 3.7–4.7)
Alkaline Phosphatase: 77 IU/L (ref 44–121)
BUN/Creatinine Ratio: 21 (ref 12–28)
BUN: 24 mg/dL (ref 8–27)
Bilirubin Total: 0.7 mg/dL (ref 0.0–1.2)
CO2: 27 mmol/L (ref 20–29)
Calcium: 10.6 mg/dL — ABNORMAL HIGH (ref 8.7–10.3)
Chloride: 99 mmol/L (ref 96–106)
Creatinine, Ser: 1.17 mg/dL — ABNORMAL HIGH (ref 0.57–1.00)
Globulin, Total: 2.3 g/dL (ref 1.5–4.5)
Glucose: 113 mg/dL — ABNORMAL HIGH (ref 70–99)
Potassium: 4.5 mmol/L (ref 3.5–5.2)
Sodium: 140 mmol/L (ref 134–144)
Total Protein: 6.5 g/dL (ref 6.0–8.5)
eGFR: 45 mL/min/{1.73_m2} — ABNORMAL LOW (ref >59)

## 2022-03-10 LAB — LIPID PANEL
Chol/HDL Ratio: 2.6 ratio (ref 0.0–4.4)
Cholesterol, Total: 194 mg/dL (ref 100–199)
HDL: 75 mg/dL (ref >39)
LDL Chol Calc (NIH): 101 mg/dL — ABNORMAL HIGH (ref 0–99)
Triglycerides: 99 mg/dL (ref 0–149)
VLDL Cholesterol Cal: 18 mg/dL (ref 5–40)

## 2022-03-10 LAB — CBC WITH DIFFERENTIAL/PLATELET
Basophils Absolute: 0.1 10*3/uL (ref 0.0–0.2)
Basos: 1 %
EOS (ABSOLUTE): 0 10*3/uL (ref 0.0–0.4)
Eos: 0 %
Hematocrit: 39.9 % (ref 34.0–46.6)
Hemoglobin: 13.1 g/dL (ref 11.1–15.9)
Immature Grans (Abs): 0 10*3/uL (ref 0.0–0.1)
Immature Granulocytes: 1 %
Lymphocytes Absolute: 1.1 10*3/uL (ref 0.7–3.1)
Lymphs: 14 %
MCH: 31.8 pg (ref 26.6–33.0)
MCHC: 32.8 g/dL (ref 31.5–35.7)
MCV: 97 fL (ref 79–97)
Monocytes Absolute: 0.7 10*3/uL (ref 0.1–0.9)
Monocytes: 9 %
Neutrophils Absolute: 6.3 10*3/uL (ref 1.4–7.0)
Neutrophils: 75 %
Platelets: 348 10*3/uL (ref 150–450)
RBC: 4.12 x10E6/uL (ref 3.77–5.28)
RDW: 12.3 % (ref 11.7–15.4)
WBC: 8.2 10*3/uL (ref 3.4–10.8)

## 2022-03-10 NOTE — Telephone Encounter (Signed)
Called to schedule patient for an office visit.  She said the last time she used "that barium" stuff it really "did her in".  She wants to talk more to [facility] before we schedule an appointment.

## 2022-04-07 ENCOUNTER — Ambulatory Visit (INDEPENDENT_AMBULATORY_CARE_PROVIDER_SITE_OTHER): Payer: Medicare Other

## 2022-04-07 ENCOUNTER — Ambulatory Visit: Payer: Medicare Other

## 2022-04-07 DIAGNOSIS — M810 Age-related osteoporosis without current pathological fracture: Secondary | ICD-10-CM | POA: Diagnosis not present

## 2022-04-07 MED ORDER — DENOSUMAB 60 MG/ML ~~LOC~~ SOSY
60.0000 mg | PREFILLED_SYRINGE | Freq: Once | SUBCUTANEOUS | Status: AC
  Administered 2022-04-07: 60 mg via SUBCUTANEOUS

## 2022-04-07 NOTE — Progress Notes (Signed)
Prolia injection given to left upper arm.  Patient tolerated well.

## 2022-04-26 DIAGNOSIS — Z85820 Personal history of malignant melanoma of skin: Secondary | ICD-10-CM | POA: Diagnosis not present

## 2022-04-26 DIAGNOSIS — Z1283 Encounter for screening for malignant neoplasm of skin: Secondary | ICD-10-CM | POA: Diagnosis not present

## 2022-04-26 DIAGNOSIS — Z08 Encounter for follow-up examination after completed treatment for malignant neoplasm: Secondary | ICD-10-CM | POA: Diagnosis not present

## 2022-04-26 DIAGNOSIS — D225 Melanocytic nevi of trunk: Secondary | ICD-10-CM | POA: Diagnosis not present

## 2022-09-02 ENCOUNTER — Telehealth: Payer: Self-pay | Admitting: Nurse Practitioner

## 2022-09-02 ENCOUNTER — Telehealth: Payer: Self-pay

## 2022-09-02 DIAGNOSIS — Z08 Encounter for follow-up examination after completed treatment for malignant neoplasm: Secondary | ICD-10-CM | POA: Diagnosis not present

## 2022-09-02 DIAGNOSIS — Z1283 Encounter for screening for malignant neoplasm of skin: Secondary | ICD-10-CM | POA: Diagnosis not present

## 2022-09-02 DIAGNOSIS — Z86006 Personal history of melanoma in-situ: Secondary | ICD-10-CM | POA: Diagnosis not present

## 2022-09-02 DIAGNOSIS — D225 Melanocytic nevi of trunk: Secondary | ICD-10-CM | POA: Diagnosis not present

## 2022-09-02 NOTE — Telephone Encounter (Signed)
Created new encounter for Prolia BIV. Will route encounter back once benefit verification is complete.  

## 2022-09-02 NOTE — Telephone Encounter (Signed)
Please check benefits for Prolia - last injection: 04/07/22

## 2022-09-02 NOTE — Telephone Encounter (Signed)
Prolia VOB initiated via [email or web address]  Next Prolia inj DUE: 10/05/22

## 2022-09-03 ENCOUNTER — Ambulatory Visit (INDEPENDENT_AMBULATORY_CARE_PROVIDER_SITE_OTHER): Payer: Medicare Other

## 2022-09-03 ENCOUNTER — Encounter: Payer: Self-pay | Admitting: Nurse Practitioner

## 2022-09-03 ENCOUNTER — Ambulatory Visit (INDEPENDENT_AMBULATORY_CARE_PROVIDER_SITE_OTHER): Payer: Medicare Other | Admitting: Nurse Practitioner

## 2022-09-03 VITALS — BP 131/70 | HR 76 | Temp 97.9°F | Resp 20 | Ht 65.0 in | Wt 118.0 lb

## 2022-09-03 DIAGNOSIS — E782 Mixed hyperlipidemia: Secondary | ICD-10-CM

## 2022-09-03 DIAGNOSIS — Z780 Asymptomatic menopausal state: Secondary | ICD-10-CM | POA: Diagnosis not present

## 2022-09-03 DIAGNOSIS — M810 Age-related osteoporosis without current pathological fracture: Secondary | ICD-10-CM

## 2022-09-03 DIAGNOSIS — I10 Essential (primary) hypertension: Secondary | ICD-10-CM

## 2022-09-03 DIAGNOSIS — F411 Generalized anxiety disorder: Secondary | ICD-10-CM

## 2022-09-03 MED ORDER — ATORVASTATIN CALCIUM 20 MG PO TABS: 20.0000 mg | ORAL_TABLET | Freq: Every day | ORAL | 2 refills | Status: DC

## 2022-09-03 MED ORDER — LISINOPRIL-HYDROCHLOROTHIAZIDE 10-12.5 MG PO TABS: 1.0000 | ORAL_TABLET | Freq: Every day | ORAL | 2 refills | Status: DC

## 2022-09-03 NOTE — Progress Notes (Signed)
Subjective:    Patient ID: Laura Acevedo, female    DOB: 1932/03/13, 89 y.o.   MRN: [id number]   Chief Complaint: medical management of chronic issues     HPI:  Laura Acevedo is a 89 y.o. who identifies as a female who was assigned female at birth.   Social history: Lives with: by herself Work history: retired   Water engineer in today for follow up of the following chronic medical issues:  1. Essential hypertension, benign No c/o chest pain, sob or headache. Does not check blood pressure at home. BP Readings from Last 3 Encounters:  03/09/22 119/66  08/27/21 137/64  02/26/21 (!) 147/75     2. Mixed hyperlipidemia Does try to watch diet. Does no dedicated exercise. Lab Results  Component Value Date   CHOL 194 03/09/2022   HDL 75 03/09/2022   LDLCALC 101 (H) 03/09/2022   TRIG 99 03/09/2022   CHOLHDL 2.6 03/09/2022      3. GAD (generalized anxiety disorder) Is on no medications. 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    09/03/2022   10:29 AM 03/09/2022   10:55 AM 08/27/2021   10:17 AM 02/26/2021   10:31 AM  GAD 7 : Generalized Anxiety Score  Nervous, Anxious, on Edge 0 0 0 1  Control/stop worrying 0 0 0 1  Worry too much - different things 0 0 0 1  Trouble relaxing 0 0 0 1  Restless 0 0 0 0  Easily annoyed or irritable 0 0 0 0  Afraid - awful might happen 0 0 0 0  Total GAD 7 Score 0 0 0 4  Anxiety Difficulty Not difficult at all Not difficult at all Not difficult at all Somewhat difficult      4. Age-related osteoporosis without current pathological fracture Last dexascan was done on 02/19/19. T score was -2.1   New complaints: None today  No Known Allergies Outpatient Encounter Medications as of 09/03/2022  Medication Sig   aspirin EC 81 MG tablet Take 81 mg by mouth daily.   atorvastatin (LIPITOR) 20 MG tablet Take 1 tablet (20 mg total) by mouth daily.   calcium carbonate (OS-CAL) 600 MG TABS Take 600 mg by mouth daily. AT LUNCH   cholecalciferol (VITAMIN D) 1000 UNITS  tablet Take 1,000 Units by mouth daily.    denosumab (PROLIA) 60 MG/ML SOLN injection Inject 60 mg into the skin every 6 (six) months. Administer in upper arm, thigh, or abdomen   lisinopril-hydrochlorothiazide (ZESTORETIC) 10-12.5 MG tablet Take 1 tablet by mouth daily.   Multiple Vitamins-Minerals (CENTRUM SILVER PO) Take 1 tablet by mouth every morning.   Multiple Vitamins-Minerals (PRESERVISION AREDS 2 PO) Take by mouth 2 (two) times daily.   No facility-administered encounter medications on file as of 09/03/2022.    Past Surgical History:  Procedure Laterality Date   CATARACT EXTRACTION     FOOT NEUROMA SURGERY Left 2005   Dr Ulice Brilliant   MELANOMA EXCISION Left    leg    Family History  Problem Relation Age of Onset   Diabetes Mother    Heart disease Mother    Heart disease Father    Heart attack Father    Kidney disease Father    Hypertension Sister    Diabetes Brother    Hypertension Brother    Cancer Brother        bladder    Diabetes Brother    Diabetes Brother    Colon cancer Neg Hx  Controlled substance contract: n/a     Review of Systems  Constitutional:  Negative for diaphoresis.  Eyes:  Negative for pain.  Respiratory:  Negative for shortness of breath.   Cardiovascular:  Negative for chest pain, palpitations and leg swelling.  Gastrointestinal:  Negative for abdominal pain.  Endocrine: Negative for polydipsia.  Skin:  Negative for rash.  Neurological:  Negative for dizziness, weakness and headaches.  Hematological:  Does not bruise/bleed easily.  All other systems reviewed and are negative.      Objective:   Physical Exam Vitals and nursing note reviewed.  Constitutional:      General: She is not in acute distress.    Appearance: Normal appearance. She is well-developed.  HENT:     Head: Normocephalic.     Right Ear: Tympanic membrane normal.     Left Ear: Tympanic membrane normal.     Nose: Nose normal.     Mouth/Throat:     Mouth:  Mucous membranes are moist.  Eyes:     Pupils: Pupils are equal, round, and reactive to light.  Neck:     Vascular: No carotid bruit or JVD.  Cardiovascular:     Rate and Rhythm: Normal rate and regular rhythm.     Heart sounds: Normal heart sounds.  Pulmonary:     Effort: Pulmonary effort is normal. No respiratory distress.     Breath sounds: Normal breath sounds. No wheezing or rales.  Chest:     Chest wall: No tenderness.  Abdominal:     General: Bowel sounds are normal. There is no distension or abdominal bruit.     Palpations: Abdomen is soft. There is no hepatomegaly, splenomegaly, mass or pulsatile mass.     Tenderness: There is no abdominal tenderness.  Musculoskeletal:        General: Normal range of motion.     Cervical back: Normal range of motion and neck supple.  Lymphadenopathy:     Cervical: No cervical adenopathy.  Skin:    General: Skin is warm and dry.  Neurological:     Mental Status: She is alert and oriented to person, place, and time.     Deep Tendon Reflexes: Reflexes are normal and symmetric.  Psychiatric:        Behavior: Behavior normal.        Thought Content: Thought content normal.        Judgment: Judgment normal.     BP 131/70   Pulse 76   Temp 97.9 F (36.6 C) (Temporal)   Resp 20   Ht 5\' 5"  (1.651 m)   Wt 118 lb (53.5 kg)   SpO2 100%   BMI 19.64 kg/m        Assessment & Plan:   Laura Acevedo comes in today with chief complaint of Medical Management of Chronic Issues   Diagnosis and orders addressed:  1. Essential hypertension, benign Low sodium diet - lisinopril-hydrochlorothiazide (ZESTORETIC) 10-12.5 MG tablet; Take 1 tablet by mouth daily.  Dispense: 100 tablet; Refill: 2 - CBC with Differential/Platelet - CMP14+EGFR  2. Mixed hyperlipidemia Low fat diet - atorvastatin (LIPITOR) 20 MG tablet; Take 1 tablet (20 mg total) by mouth daily.  Dispense: 100 tablet; Refill: 2 - Lipid panel  3. GAD (generalized anxiety  disorder) Stress management  4. Age-related osteoporosis without current pathological fracture Weight bearing exercise.   Labs pending Health Maintenance reviewed Diet and exercise encouraged  Follow up plan: 6 months   [place]-Margaret Daphine Deutscher, FNP

## 2022-09-04 LAB — LIPID PANEL
Chol/HDL Ratio: 1.7 ratio (ref 0.0–4.4)
Cholesterol, Total: 164 mg/dL (ref 100–199)
HDL: 97 mg/dL (ref >39)
LDL Chol Calc (NIH): 52 mg/dL (ref 0–99)
Triglycerides: 82 mg/dL (ref 0–149)
VLDL Cholesterol Cal: 15 mg/dL (ref 5–40)

## 2022-09-04 LAB — CMP14+EGFR
ALT: 48 IU/L — ABNORMAL HIGH (ref 0–32)
AST: 42 IU/L — ABNORMAL HIGH (ref 0–40)
Albumin: 4.4 g/dL (ref 3.7–4.7)
Alkaline Phosphatase: 79 IU/L (ref 44–121)
BUN/Creatinine Ratio: 18 (ref 12–28)
BUN: 22 mg/dL (ref 8–27)
Bilirubin Total: 1 mg/dL (ref 0.0–1.2)
CO2: 27 mmol/L (ref 20–29)
Calcium: 10 mg/dL (ref 8.7–10.3)
Chloride: 101 mmol/L (ref 96–106)
Creatinine, Ser: 1.2 mg/dL — ABNORMAL HIGH (ref 0.57–1.00)
Globulin, Total: 2.3 g/dL (ref 1.5–4.5)
Glucose: 103 mg/dL — ABNORMAL HIGH (ref 70–99)
Potassium: 3.9 mmol/L (ref 3.5–5.2)
Sodium: 142 mmol/L (ref 134–144)
Total Protein: 6.7 g/dL (ref 6.0–8.5)
eGFR: 44 mL/min/{1.73_m2} — ABNORMAL LOW (ref >59)

## 2022-09-04 LAB — CBC WITH DIFFERENTIAL/PLATELET
Basophils Absolute: 0 10*3/uL (ref 0.0–0.2)
Basos: 1 %
EOS (ABSOLUTE): 0 10*3/uL (ref 0.0–0.4)
Eos: 0 %
Hematocrit: 41.1 % (ref 34.0–46.6)
Hemoglobin: 13.2 g/dL (ref 11.1–15.9)
Immature Grans (Abs): 0 10*3/uL (ref 0.0–0.1)
Immature Granulocytes: 0 %
Lymphocytes Absolute: 1.2 10*3/uL (ref 0.7–3.1)
Lymphs: 23 %
MCH: 31.7 pg (ref 26.6–33.0)
MCHC: 32.1 g/dL (ref 31.5–35.7)
MCV: 99 fL — ABNORMAL HIGH (ref 79–97)
Monocytes Absolute: 0.5 10*3/uL (ref 0.1–0.9)
Monocytes: 10 %
Neutrophils Absolute: 3.5 10*3/uL (ref 1.4–7.0)
Neutrophils: 66 %
Platelets: 215 10*3/uL (ref 150–450)
RBC: 4.16 x10E6/uL (ref 3.77–5.28)
RDW: 12.2 % (ref 11.7–15.4)
WBC: 5.3 10*3/uL (ref 3.4–10.8)

## 2022-09-06 ENCOUNTER — Other Ambulatory Visit (HOSPITAL_COMMUNITY): Payer: Self-pay

## 2022-09-06 NOTE — Telephone Encounter (Signed)
Authorization Number: [id number] 09/06/22-09/06/23

## 2022-09-06 NOTE — Telephone Encounter (Signed)
Pt ready for scheduling for PROLIA on or after : 10/05/22  Out-of-pocket cost due at time of visit: $350  Primary: [employer] MEDICARE Prolia co-insurance: 20% Admin fee co-insurance: $30  Secondary: --- Prolia co-insurance:  Admin fee co-insurance:   Medical Benefit Details: Date Benefits were checked: 09/02/22 Deductible: NO/ Coinsurance: 20%/ Admin Fee: $30  Prior Auth: APPROVED PA# [id number]  Expiration Date: 09/06/22-09/06/23  # of doses approved: 2  Pharmacy benefit: Copay $300 If patient wants fill through the pharmacy benefit please send prescription to: [facility], and include estimated need by date in rx notes. Pharmacy will ship medication directly to the office.  Patient NOT eligible for Prolia Copay Card. Copay Card can make patient's cost as little as $25. Link to apply: [email or web address]  ** This summary of benefits is an estimation of the patient's out-of-pocket cost. Exact cost may very based on individual plan coverage.

## 2022-09-09 DIAGNOSIS — L84 Corns and callosities: Secondary | ICD-10-CM | POA: Diagnosis not present

## 2022-09-09 DIAGNOSIS — I70203 Unspecified atherosclerosis of native arteries of extremities, bilateral legs: Secondary | ICD-10-CM | POA: Diagnosis not present

## 2022-09-09 DIAGNOSIS — L603 Nail dystrophy: Secondary | ICD-10-CM | POA: Diagnosis not present

## 2022-09-09 DIAGNOSIS — M79676 Pain in unspecified toe(s): Secondary | ICD-10-CM | POA: Diagnosis not present

## 2022-09-15 ENCOUNTER — Encounter (INDEPENDENT_AMBULATORY_CARE_PROVIDER_SITE_OTHER): Payer: Self-pay | Admitting: Gastroenterology

## 2022-09-15 ENCOUNTER — Ambulatory Visit (INDEPENDENT_AMBULATORY_CARE_PROVIDER_SITE_OTHER): Payer: Medicare Other | Admitting: Gastroenterology

## 2022-09-15 ENCOUNTER — Encounter (INDEPENDENT_AMBULATORY_CARE_PROVIDER_SITE_OTHER): Payer: Self-pay

## 2022-09-15 VITALS — BP 162/75 | HR 74 | Temp 98.2°F | Ht 65.0 in | Wt 119.6 lb

## 2022-09-15 DIAGNOSIS — R748 Abnormal levels of other serum enzymes: Secondary | ICD-10-CM | POA: Diagnosis not present

## 2022-09-15 DIAGNOSIS — R1312 Dysphagia, oropharyngeal phase: Secondary | ICD-10-CM | POA: Diagnosis not present

## 2022-09-15 DIAGNOSIS — I10 Essential (primary) hypertension: Secondary | ICD-10-CM

## 2022-09-15 NOTE — Progress Notes (Signed)
[name] , M.D. Gastroenterology & Hepatology [facility] [address] [place], [place] [address] Primary Care Physician: Bennie Pierini, FNP [address] [place] [address]  Chief Complaint: Dysphagia and elevated liver enzymes  History of Present Illness: Laura Acevedo is a 89 y.o. female with hypertension, hyperlipidemia, generalized anxiety disorder who presents for evaluation of Dysphagia and elevated liver enzymes.  Patient reports that for past few years she has been having difficulty swallowing which is recently getting worse.  She reports that if she takes her pills during the later part of the knee she would feel as if they are coming off and she will obtain the pills.  Patient has occasional solid food dysphagia as if food is going down in bed and of her chest.  Patient also would choke occasionally on liquids.  Weight has been stable and reports her appetite is not as great as it used to be  Patient denies taking any herbal supplements or any medications as she has noticed to have new elevated liver enzymes on recent blood work  The patient denies having any nausea, vomiting, fever, chills, hematochezia, melena, hematemesis, abdominal distention, abdominal pain, diarrhea, jaundice, pruritus or weight loss.  Last YQI:HKVQ Last Colonoscopy:  Last colonoscopy 2014 with SSP  FHx: neg for any gastrointestinal/liver disease, no malignancies Social: neg smoking, alcohol or illicit drug use Surgical: no abdominal surgeries  Past Medical History: Past Medical History:  Diagnosis Date   Cancer (HCC) 1980   melanoma   Cataract    Colon polyps    Foot neuroma 2005   left   Hyperlipidemia    Hypertension    Osteoporosis     Past Surgical History: Past Surgical History:  Procedure Laterality Date   CATARACT EXTRACTION     FOOT NEUROMA SURGERY Left 2005   Dr Ulice Brilliant   MELANOMA EXCISION Left    leg     Family History: Family History  Problem Relation Age of Onset   Diabetes Mother    Heart disease Mother    Heart disease Father    Heart attack Father    Kidney disease Father    Hypertension Sister    Diabetes Brother    Hypertension Brother    Cancer Brother        bladder    Diabetes Brother    Diabetes Brother    Colon cancer Neg Hx     Social History: Social History   Tobacco Use  Smoking Status Never   Passive exposure: Never  Smokeless Tobacco Never   Social History   Substance and Sexual Activity  Alcohol Use No   Social History   Substance and Sexual Activity  Drug Use No    Allergies: No Known Allergies  Medications: Current Outpatient Medications  Medication Sig Dispense Refill   aspirin EC 81 MG tablet Take 81 mg by mouth daily.     atorvastatin (LIPITOR) 20 MG tablet Take 1 tablet (20 mg total) by mouth daily. 100 tablet 2   calcium carbonate (OS-CAL) 600 MG TABS Take 600 mg by mouth daily. AT LUNCH     cholecalciferol (VITAMIN D) 1000 UNITS tablet Take 1,000 Units by mouth daily.      denosumab (PROLIA) 60 MG/ML SOLN injection Inject 60 mg into the skin every 6 (six) months. Administer in upper arm, thigh, or abdomen     lisinopril-hydrochlorothiazide (ZESTORETIC) 10-12.5 MG tablet Take 1 tablet by mouth daily. 100 tablet 2  Multiple Vitamins-Minerals (CENTRUM SILVER PO) Take 1 tablet by mouth every morning.     Multiple Vitamins-Minerals (PRESERVISION AREDS 2 PO) Take by mouth 2 (two) times daily.     No current facility-administered medications for this visit.    Review of Systems: GENERAL: negative for malaise, night sweats HEENT: No changes in hearing or vision, no nose bleeds or other nasal problems. NECK: Negative for lumps, goiter, pain and significant neck swelling RESPIRATORY: Negative for cough, wheezing CARDIOVASCULAR: Negative for chest pain, leg swelling, palpitations, orthopnea GI: SEE HPI MUSCULOSKELETAL: Negative for  joint pain or swelling, back pain, and muscle pain. SKIN: Negative for lesions, rash HEMATOLOGY Negative for prolonged bleeding, bruising easily, and swollen nodes. ENDOCRINE: Negative for cold or heat intolerance, polyuria, polydipsia and goiter. NEURO: negative for tremor, gait imbalance, syncope and seizures. The remainder of the review of systems is noncontributory.   Physical Exam: BP (!) 162/75   Pulse 74   Temp 98.2 F (36.8 C) (Oral)   Ht 5\' 5"  (1.651 m)   Wt 119 lb 9.6 oz (54.3 kg)   BMI 19.90 kg/m  GENERAL: The patient is AO x3, in no acute distress. HEENT: Head is normocephalic and atraumatic. EOMI are intact. Mouth is well hydrated and without lesions. NECK: Supple. No masses LUNGS: Clear to auscultation. No presence of rhonchi/wheezing/rales. Adequate chest expansion HEART: RRR, normal s1 and s2. ABDOMEN: Soft, nontender, no guarding, no peritoneal signs, and nondistended. BS +. No masses. EXTREMITIES: Without any cyanosis, clubbing, rash, lesions or edema. NEUROLOGIC: AOx3, no focal motor deficit. SKIN: no jaundice, no rashes   Imaging/Labs: as above  I personally reviewed and interpreted the available labs, imaging and endoscopic files.  Labs from 09/03/2022 with mildly elevated liver enzymes AST 42 ALT 48 T. bili 1 hemoglobin 13.2 platelet 250  Impression and Plan:  Laura Acevedo is a 89 y.o. female with hypertension, hyperlipidemia, generalized anxiety disorder who presents for evaluation of Dysphagia and elevated liver enzymes.  # Dysphagia  Patient has both solid and liquid food dysphagia with occasional regurgitation and given advanced age this is considered an alarm symptom.  This could be due to esophageal web ring stricture but given advanced age malignancy needs to be ruled out, diagnostic upper endoscopy diarrhea modality to investigate and also intervene for possible dilation  Will proceed with diagnostic upper endoscopy plus and minus  dilation Given patient will choke on liquid right away after drinking I also assume there is a competent of pharyngeal dysphagia hence patient with benefit from speech and swallow evaluation with modified barium swallow  Aspiration precautions - Cut food in small pieces and chew food thoroughly to avoid regurgitation episodes. - Discussed avoidance of eating within 3 hours of lying down to sleep and benefit of blocks to elevate head of bed. Also, will benefit from avoiding carbonated drinks/sodas or food that has tomatoes, spicy or greasy food.   # Elevated liver enzymes Hepatocellular pattern liver injury ALT 48 AST 42  Patient without any herbal supplement use and no new medications.  Found to have newly elevated liver enzymes and recent blood work  Will get a baseline viral hepatitis profile and right upper quadrant sono  #Hypertension The patient was found to have elevated blood pressure when vital signs were checked in the office. The blood pressure was rechecked by the nursing staff and it was found be persistently elevated >140/90 mmHg. I personally advised to the patient to follow up closely with PCP for hypertension control.  HCM: Colonoscopy 2014 with 1SSA, patient is not interested in further colonoscopies given her advanced age  All questions were answered.      [name], MD Gastroenterology and Hepatology [facility]enter[facility]   This chart has been completed using [employer] Dictation software, and while attempts have been made to ensure accuracy , certain words and phrases may not be transcribed as intended

## 2022-09-15 NOTE — Telephone Encounter (Signed)
Spoke with patient to schedule Prolia injection.  She could not come in September so we scheduled an appointment for 11/12/22.

## 2022-09-15 NOTE — Patient Instructions (Signed)
It was very nice to meet you today, as dicussed with will plan for the following :  1) Upper endoscopy  2) Modified Barium swallow  3) Labwork and Ultrasound

## 2022-09-16 LAB — HEPATITIS A ANTIBODY, TOTAL: hep A Total Ab: POSITIVE — AB

## 2022-09-16 LAB — HEPATITIS B CORE ANTIBODY, TOTAL: Hep B Core Total Ab: NEGATIVE

## 2022-09-16 LAB — HEPATITIS B SURFACE ANTIGEN: Hepatitis B Surface Ag: NEGATIVE

## 2022-09-16 LAB — HEPATITIS B SURFACE ANTIBODY,QUALITATIVE: Hep B Surface Ab, Qual: NONREACTIVE

## 2022-09-16 LAB — HEPATITIS C ANTIBODY: Hep C Virus Ab: NONREACTIVE

## 2022-09-21 NOTE — Progress Notes (Signed)
Hi Laura Acevedo ,  Can you please call the patient and tell the patient the lab work shows  He is immune to hepatitis A.  Negative for hepatitis B and C  Recommend obtaining ultrasound of the abdomen which is already ordered  Thanks,  [name], MD Gastroenterology and Hepatology [facility]

## 2022-09-22 ENCOUNTER — Encounter (INDEPENDENT_AMBULATORY_CARE_PROVIDER_SITE_OTHER): Payer: Self-pay

## 2022-09-27 ENCOUNTER — Other Ambulatory Visit (HOSPITAL_COMMUNITY): Payer: Self-pay | Admitting: Occupational Therapy

## 2022-09-27 DIAGNOSIS — R059 Cough, unspecified: Secondary | ICD-10-CM

## 2022-09-27 DIAGNOSIS — R1312 Dysphagia, oropharyngeal phase: Secondary | ICD-10-CM

## 2022-09-28 ENCOUNTER — Ambulatory Visit (HOSPITAL_COMMUNITY)
Admission: RE | Admit: 2022-09-28 | Discharge: 2022-09-28 | Disposition: A | Discharge status: Home / Self Care | Payer: Medicare Other | Source: Ambulatory Visit | Attending: Gastroenterology | Admitting: Gastroenterology

## 2022-09-28 DIAGNOSIS — R932 Abnormal findings on diagnostic imaging of liver and biliary tract: Secondary | ICD-10-CM | POA: Diagnosis not present

## 2022-09-28 DIAGNOSIS — R748 Abnormal levels of other serum enzymes: Secondary | ICD-10-CM | POA: Diagnosis not present

## 2022-10-11 NOTE — Patient Instructions (Addendum)
MALLOREE SMISEK  10/11/2022     @PREFPERIOPPHARMACY @   Your procedure is scheduled on  10/15/2022.   Report to [facility] at  0700  A.M.   Call this number if you have problems the morning of surgery:  [phone]  If you experience any cold or flu symptoms such as cough, fever, chills, shortness of breath, etc. between now and your scheduled surgery, please notify us at the above number.   Remember:  Follow the diet instructions given to you by the office.     Take these medicines the morning of surgery with A SIP OF WATER                                             None.     Do not wear jewelry, make-up or nail polish, including gel polish,  artificial nails, or any other type of covering on natural nails (fingers and  toes).  Do not wear lotions, powders, or perfumes, or deodorant.  Do not shave 48 hours prior to surgery.  Men may shave face and neck.  Do not bring valuables to the hospital.  [facility] is not responsible for any belongings or valuables.  Contacts, dentures or bridgework may not be worn into surgery.  Leave your suitcase in the car.  After surgery it may be brought to your room.  For patients admitted to the hospital, discharge time will be determined by your treatment team.  Patients discharged the day of surgery will not be allowed to drive home and must have someone with them for 24 hours.    Special instructions:   DO NOT smoke tobacco or vape for 24 hours before your procedure.  Please read over the following fact sheets that you were given. Anesthesia Post-op Instructions and Care and Recovery After Surgery       Upper Endoscopy, Adult, Care After After the procedure, it is common to have a sore throat. It is also common to have: Mild stomach pain or discomfort. Bloating. Nausea. Follow these instructions at home: The instructions below may help you care for yourself at home. Your health care provider may give you more  instructions. If you have questions, ask your health care provider. If you were given a sedative during the procedure, it can affect you for several hours. Do not drive or operate machinery until your health care provider says that it is safe. If you will be going home right after the procedure, plan to have a responsible adult: Take you home from the hospital or clinic. You will not be allowed to drive. Care for you for the time you are told. Follow instructions from your health care provider about what you may eat and drink. Return to your normal activities as told by your health care provider. Ask your health care provider what activities are safe for you. Take over-the-counter and prescription medicines only as told by your health care provider. Contact a health care provider if you: Have a sore throat that lasts longer than one day. Have trouble swallowing. Have a fever. Get help right away if you: Vomit blood or your vomit looks like coffee grounds. Have bloody, black, or tarry stools. Have a very bad sore throat or you cannot swallow. Have difficulty breathing or very bad pain in your chest or abdomen. These symptoms  may be an emergency. Get help right away. Call 911. Do not wait to see if the symptoms will go away. Do not drive yourself to the hospital. Summary After the procedure, it is common to have a sore throat, mild stomach discomfort, bloating, and nausea. If you were given a sedative during the procedure, it can affect you for several hours. Do not drive until your health care provider says that it is safe. Follow instructions from your health care provider about what you may eat and drink. Return to your normal activities as told by your health care provider. This information is not intended to replace advice given to you by your health care provider. Make sure you discuss any questions you have with your health care provider. Document Revised: 04/15/2021 Document Reviewed:  04/15/2021 Elsevier Patient Education  2024 Elsevier Inc. Monitored Anesthesia Care, Care After The following information offers guidance on how to care for yourself after your procedure. Your health care provider may also give you more specific instructions. If you have problems or questions, contact your health care provider. What can I expect after the procedure? After the procedure, it is common to have: Tiredness. Little or no memory about what happened during or after the procedure. Impaired judgment when it comes to making decisions. Nausea or vomiting. Some trouble with balance. Follow these instructions at home: For the time period you were told by your health care provider:  Rest. Do not participate in activities where you could fall or become injured. Do not drive or use machinery. Do not drink alcohol. Do not take sleeping pills or medicines that cause drowsiness. Do not make important decisions or sign legal documents. Do not take care of children on your own. Medicines Take over-the-counter and prescription medicines only as told by your health care provider. If you were prescribed antibiotics, take them as told by your health care provider. Do not stop using the antibiotic even if you start to feel better. Eating and drinking Follow instructions from your health care provider about what you may eat and drink. Drink enough fluid to keep your urine pale yellow. If you vomit: Drink clear fluids slowly and in small amounts as you are able. Clear fluids include water, ice chips, low-calorie sports drinks, and fruit juice that has water added to it (diluted fruit juice). Eat light and bland foods in small amounts as you are able. These foods include bananas, applesauce, rice, lean meats, toast, and crackers. General instructions  Have a responsible adult stay with you for the time you are told. It is important to have someone help care for you until you are awake and  alert. If you have sleep apnea, surgery and some medicines can increase your risk for breathing problems. Follow instructions from your health care provider about wearing your sleep device: When you are sleeping. This includes during daytime naps. While taking prescription pain medicines, sleeping medicines, or medicines that make you drowsy. Do not use any products that contain nicotine or tobacco. These products include cigarettes, chewing tobacco, and vaping devices, such as e-cigarettes. If you need help quitting, ask your health care provider. Contact a health care provider if: You feel nauseous or vomit every time you eat or drink. You feel light-headed. You are still sleepy or having trouble with balance after 24 hours. You get a rash. You have a fever. You have redness or swelling around the IV site. Get help right away if: You have trouble breathing. You have new confusion  after you get home. These symptoms may be an emergency. Get help right away. Call 911. Do not wait to see if the symptoms will go away. Do not drive yourself to the hospital. This information is not intended to replace advice given to you by your health care provider. Make sure you discuss any questions you have with your health care provider. Document Revised: 06/01/2021 Document Reviewed: 06/01/2021 Elsevier Patient Education  2024 [employer].

## 2022-10-12 NOTE — Progress Notes (Signed)
Hi Toniann Fail ,  Can you please call the patient and tell the ultrasound only shows fatty liver   Thanks,  [name], MD Gastroenterology and Hepatology [facility] Gastroenterology

## 2022-10-13 ENCOUNTER — Encounter (HOSPITAL_COMMUNITY): Payer: Self-pay

## 2022-10-13 ENCOUNTER — Encounter (HOSPITAL_COMMUNITY)
Admission: RE | Admit: 2022-10-13 | Discharge: 2022-10-13 | Disposition: A | Discharge status: Home / Self Care | Payer: Medicare Other | Source: Ambulatory Visit | Attending: Gastroenterology | Admitting: Gastroenterology

## 2022-10-13 DIAGNOSIS — I10 Essential (primary) hypertension: Secondary | ICD-10-CM | POA: Diagnosis not present

## 2022-10-13 DIAGNOSIS — Z01810 Encounter for preprocedural cardiovascular examination: Secondary | ICD-10-CM | POA: Diagnosis not present

## 2022-10-15 ENCOUNTER — Ambulatory Visit (HOSPITAL_COMMUNITY)
Admission: RE | Admit: 2022-10-15 | Discharge: 2022-10-15 | Disposition: A | Discharge status: Home / Self Care | Payer: Medicare Other | Attending: Gastroenterology | Admitting: Gastroenterology

## 2022-10-15 ENCOUNTER — Ambulatory Visit (HOSPITAL_COMMUNITY): Payer: Medicare Other | Admitting: Anesthesiology

## 2022-10-15 ENCOUNTER — Other Ambulatory Visit: Payer: Self-pay

## 2022-10-15 ENCOUNTER — Encounter (HOSPITAL_COMMUNITY)
Admission: RE | Disposition: A | Discharge status: Home / Self Care | Payer: Self-pay | Source: Home / Self Care | Attending: Gastroenterology

## 2022-10-15 ENCOUNTER — Encounter (HOSPITAL_COMMUNITY): Payer: Self-pay

## 2022-10-15 DIAGNOSIS — K449 Diaphragmatic hernia without obstruction or gangrene: Secondary | ICD-10-CM | POA: Diagnosis not present

## 2022-10-15 DIAGNOSIS — K259 Gastric ulcer, unspecified as acute or chronic, without hemorrhage or perforation: Secondary | ICD-10-CM | POA: Diagnosis not present

## 2022-10-15 DIAGNOSIS — K2970 Gastritis, unspecified, without bleeding: Secondary | ICD-10-CM

## 2022-10-15 DIAGNOSIS — R1310 Dysphagia, unspecified: Secondary | ICD-10-CM | POA: Insufficient documentation

## 2022-10-15 DIAGNOSIS — K2950 Unspecified chronic gastritis without bleeding: Secondary | ICD-10-CM

## 2022-10-15 DIAGNOSIS — E785 Hyperlipidemia, unspecified: Secondary | ICD-10-CM | POA: Insufficient documentation

## 2022-10-15 DIAGNOSIS — I10 Essential (primary) hypertension: Secondary | ICD-10-CM | POA: Diagnosis not present

## 2022-10-15 DIAGNOSIS — F411 Generalized anxiety disorder: Secondary | ICD-10-CM | POA: Insufficient documentation

## 2022-10-15 DIAGNOSIS — K225 Diverticulum of esophagus, acquired: Secondary | ICD-10-CM

## 2022-10-15 HISTORY — PX: ESOPHAGOGASTRODUODENOSCOPY (EGD) WITH PROPOFOL: SHX5813

## 2022-10-15 HISTORY — PX: BIOPSY: SHX5522

## 2022-10-15 SURGERY — ESOPHAGOGASTRODUODENOSCOPY (EGD) WITH PROPOFOL
Anesthesia: General

## 2022-10-15 MED ORDER — LIDOCAINE HCL 1 % IJ SOLN
INTRAMUSCULAR | Status: DC | PRN
  Administered 2022-10-15: 50 mg via INTRADERMAL

## 2022-10-15 MED ORDER — PROPOFOL 10 MG/ML IV BOLUS
INTRAVENOUS | Status: DC | PRN
Start: 2022-10-15 — End: 2022-10-15
  Administered 2022-10-15: 20 mg via INTRAVENOUS
  Administered 2022-10-15 (×2): 50 mg via INTRAVENOUS
  Administered 2022-10-15: 20 mg via INTRAVENOUS
  Administered 2022-10-15: 40 mg via INTRAVENOUS

## 2022-10-15 MED ORDER — LACTATED RINGERS IV SOLN: INTRAVENOUS | Status: DC

## 2022-10-15 NOTE — Transfer of Care (Signed)
Immediate Anesthesia Transfer of Care Note  Patient: Laura Acevedo  Procedure(s) Performed: ESOPHAGOGASTRODUODENOSCOPY (EGD) WITH PROPOFOL BIOPSY  Patient Location: Short Stay  Anesthesia Type:General  Level of Consciousness: awake  Airway & Oxygen Therapy: Patient Spontanous Breathing  Post-op Assessment: Report given to RN  Post vital signs: Reviewed and stable  Last Vitals:  Vitals Value Taken Time  BP    Temp    Pulse    Resp    SpO2      Last Pain:  Vitals:   10/15/22 0928  TempSrc:   PainSc: 0-No pain         Complications: No notable events documented.

## 2022-10-15 NOTE — H&P (Signed)
Primary Care Physician:  Bennie Pierini, FNP Primary Gastroenterologist:  Dr. Tasia Catchings  Pre-Procedure History & Physical: HPI:    Laura Acevedo is a 89 y.o. female with hypertension, hyperlipidemia, generalized anxiety disorder who presents for evaluation of Dysphagia    Patient reports that for past few years she has been having difficulty swallowing which is recently getting worse.  She reports that if she takes her pills during the later part of the knee she would feel as if they are coming off and she will obtain the pills.  Patient has occasional solid food dysphagia as if food is going down in bed and of her chest.  Patient also would choke occasionally on liquids.  Weight has been stable and reports her appetite is not as great as it used to be   Patient denies taking any herbal supplements or any medications as she has noticed to have new elevated liver enzymes on recent blood work   The patient denies having any nausea, vomiting, fever, chills, hematochezia, melena, hematemesis, abdominal distention, abdominal pain, diarrhea, jaundice, pruritus or weight loss.   Last IRJ:JOAC  Past Medical History:  Diagnosis Date   Cancer [facility]) 1980   melanoma   Cataract    Colon polyps    Foot neuroma 2005   left   Hyperlipidemia    Hypertension    Osteoporosis     Past Surgical History:  Procedure Laterality Date   CATARACT EXTRACTION     FOOT NEUROMA SURGERY Left 2005   Dr Ulice Brilliant   MELANOMA EXCISION Left    leg    Prior to Admission medications   Medication Sig Start Date End Date Taking? Authorizing Provider  aspirin EC 81 MG tablet Take 81 mg by mouth daily.   Yes [provider]  atorvastatin (LIPITOR) 20 MG tablet Take 1 tablet (20 mg total) by mouth daily. 09/03/22  Yes Martin, Deissy-Margaret, FNP  calcium carbonate (OS-CAL) 600 MG TABS Take 600 mg by mouth daily. AT LUNCH   Yes [provider]  cholecalciferol (VITAMIN D) 1000 UNITS tablet Take 1,000  Units by mouth daily.    Yes [provider]  lisinopril-hydrochlorothiazide (ZESTORETIC) 10-12.5 MG tablet Take 1 tablet by mouth daily. 09/03/22  Yes Martin, Makiah-Margaret, FNP  Multiple Vitamins-Minerals (CENTRUM SILVER PO) Take 1 tablet by mouth every morning.   Yes [provider]  Multiple Vitamins-Minerals (PRESERVISION AREDS 2 PO) Take by mouth 2 (two) times daily.   Yes [provider]  denosumab (PROLIA) 60 MG/ML SOLN injection Inject 60 mg into the skin every 6 (six) months. Administer in upper arm, thigh, or abdomen    [provider]    Allergies as of 09/15/2022   (No Known Allergies)    Family History  Problem Relation Age of Onset   Diabetes Mother    Heart disease Mother    Heart disease Father    Heart attack Father    Kidney disease Father    Hypertension Sister    Diabetes Brother    Hypertension Brother    Cancer Brother        bladder    Diabetes Brother    Diabetes Brother    Colon cancer Neg Hx     Social History   Socioeconomic History   Marital status: Widowed    Spouse name: Jonny Ruiz   Number of children: 0   Years of education: 12   Highest education level: 12th grade  Occupational History   Occupation: retired  Comment: textiles  Tobacco Use   Smoking status: Never    Passive exposure: Never   Smokeless tobacco: Never  Vaping Use   Vaping status: Never Used  Substance and Sexual Activity   Alcohol use: No   Drug use: No   Sexual activity: Not Currently  Other Topics Concern   Not on file  Social History Narrative   Husband passed 10/2020   Social Determinants of Health   Financial Resource Strain: Low Risk  (03/02/2022)   Overall Financial Resource Strain (CARDIA)    Difficulty of Paying Living Expenses: Not hard at all  Food Insecurity: No Food Insecurity (03/02/2022)   Hunger Vital Sign    Worried About Running Out of Food in the Last Year: Never true    Ran Out of Food in the Last Year: Never  true  Transportation Needs: No Transportation Needs (03/02/2022)   PRAPARE - Administrator, Civil Service (Medical): No    Lack of Transportation (Non-Medical): No  Physical Activity: Insufficiently Active (03/02/2022)   Exercise Vital Sign    Days of Exercise per Week: 3 days    Minutes of Exercise per Session: 30 min  Stress: No Stress Concern Present (03/02/2022)   [employer] of Occupational Health - Occupational Stress Questionnaire    Feeling of Stress : Not at all  Social Connections: Moderately Isolated (03/02/2022)   Social Connection and Isolation Panel [NHANES]    Frequency of Communication with Friends and Family: More than three times a week    Frequency of Social Gatherings with Friends and Family: More than three times a week    Attends Religious Services: More than 4 times per year    Active Member of [employer] or Organizations: No    Attends Banker Meetings: Never    Marital Status: Widowed  Intimate Partner Violence: Not At Risk (03/02/2022)   Humiliation, Afraid, Rape, and Kick questionnaire    Fear of Current or Ex-Partner: No    Emotionally Abused: No    Physically Abused: No    Sexually Abused: No    Review of Systems: See HPI, otherwise negative ROS  Physical Exam: Vital signs in last 24 hours: Temp:  [97.7 F (36.5 C)-98 F (36.7 C)] 97.7 F (36.5 C) (09/27 0948) Pulse Rate:  [71] 71 (09/27 0948) Resp:  [15-16] 15 (09/27 0948) BP: (121-148)/(60-67) 121/60 (09/27 0948) SpO2:  [100 %] 100 % (09/27 0948) Weight:  [54 kg] 54 kg (09/27 0719)   General:   Alert,  Well-developed, well-nourished, pleasant and cooperative in NAD Head:  Normocephalic and atraumatic. Eyes:  Sclera clear, no icterus.   Conjunctiva pink. Ears:  Normal auditory acuity. Nose:  No deformity, discharge,  or lesions. Msk:  Symmetrical without gross deformities. Normal posture. Extremities:  Without clubbing or edema. Neurologic:  Alert and  oriented x4;   grossly normal neurologically. Skin:  Intact without significant lesions or rashes. Psych:  Alert and cooperative. Normal mood and affect.  Impression/Plan: Laura Acevedo is a 89 y.o. female with hypertension, hyperlipidemia, generalized anxiety disorder who presents for evaluation of Dysphagia. Proceed with EGD  The risks of the procedure including infection, bleed, or perforation as well as benefits, limitations, alternatives and imponderables have been reviewed with the patient. Questions have been answered. All parties agreeable.

## 2022-10-15 NOTE — Discharge Instructions (Signed)
  Discharge instructions Please read the instructions outlined below and refer to this sheet in the next few weeks. These discharge instructions provide you with general information on caring for yourself after you leave the hospital. Your doctor may also give you specific instructions. While your treatment has been planned according to the most current medical practices available, unavoidable complications occasionally occur. If you have any problems or questions after discharge, please call your doctor. ACTIVITY You may resume your regular activity but move at a slower pace for the next 24 hours.  Take frequent rest periods for the next 24 hours.  Walking will help expel (get rid of) the air and reduce the bloated feeling in your abdomen.  No driving for 24 hours (because of the anesthesia (medicine) used during the test).  You may shower.  Do not sign any important legal documents or operate any machinery for 24 hours (because of the anesthesia used during the test).  NUTRITION Drink plenty of fluids.  You may resume your normal diet.  Begin with a light meal and progress to your normal diet.  Avoid alcoholic beverages for 24 hours or as instructed by your caregiver.  MEDICATIONS You may resume your normal medications unless your caregiver tells you otherwise.  WHAT YOU CAN EXPECT TODAY You may experience abdominal discomfort such as a feeling of fullness or "gas" pains.  FOLLOW-UP Your doctor will discuss the results of your test with you.  SEEK IMMEDIATE MEDICAL ATTENTION IF ANY OF THE FOLLOWING OCCUR: Excessive nausea (feeling sick to your stomach) and/or vomiting.  Severe abdominal pain and distention (swelling).  Trouble swallowing.  Temperature over 101 F (37.8 C).  Rectal bleeding or vomiting of blood.    Aspiration precautions - Cut food in small pieces and chew food thoroughly to avoid regurgitation episodes. - Discussed avoidance of eating within 3 hours of lying down to  sleep and benefit of blocks to elevate head of bed. Also, will benefit from avoiding carbonated drinks/sodas or food that has tomatoes, spicy or greasy food.   Obtain esophagram    I hope you have a great rest of your week!   [name] , M.D.. Gastroenterology and Hepatology [facility]

## 2022-10-15 NOTE — Anesthesia Postprocedure Evaluation (Signed)
Anesthesia Post Note  Patient: Laura Acevedo  Procedure(s) Performed: ESOPHAGOGASTRODUODENOSCOPY (EGD) WITH PROPOFOL BIOPSY  Patient location during evaluation: Short Stay Anesthesia Type: General Level of consciousness: awake and alert Pain management: pain level controlled Vital Signs Assessment: post-procedure vital signs reviewed and stable Respiratory status: spontaneous breathing Cardiovascular status: blood pressure returned to baseline and stable Postop Assessment: no apparent nausea or vomiting Anesthetic complications: no   No notable events documented.   Last Vitals:  Vitals:   10/15/22 0719  BP: (!) 148/67  Pulse: 71  Resp: 16  Temp: 36.7 C  SpO2: 100%    Last Pain:  Vitals:   10/15/22 0928  TempSrc:   PainSc: 0-No pain                 Regis Wiland

## 2022-10-15 NOTE — Anesthesia Preprocedure Evaluation (Signed)
Anesthesia Evaluation  Patient identified by MRN, date of birth, ID band Patient awake    Reviewed: Allergy & Precautions, H&P , NPO status , Patient's Chart, lab work & pertinent test results, reviewed documented beta blocker date and time   Airway Mallampati: II  TM Distance: >3 FB Neck ROM: full    Dental no notable dental hx.    Pulmonary neg pulmonary ROS   Pulmonary exam normal breath sounds clear to auscultation       Cardiovascular Exercise Tolerance: Good hypertension, negative cardio ROS  Rhythm:regular Rate:Normal     Neuro/Psych  PSYCHIATRIC DISORDERS Anxiety     negative neurological ROS  negative psych ROS   GI/Hepatic negative GI ROS, Neg liver ROS,,,  Endo/Other  negative endocrine ROS    Renal/GU negative Renal ROS  negative genitourinary   Musculoskeletal   Abdominal   Peds  Hematology negative hematology ROS (+)   Anesthesia Other Findings   Reproductive/Obstetrics negative OB ROS                             Anesthesia Physical Anesthesia Plan  ASA: 2  Anesthesia Plan: General   Post-op Pain Management:    Induction:   PONV Risk Score and Plan: Propofol infusion  Airway Management Planned:   Additional Equipment:   Intra-op Plan:   Post-operative Plan:   Informed Consent: I have reviewed the patients History and Physical, chart, labs and discussed the procedure including the risks, benefits and alternatives for the proposed anesthesia with the patient or authorized representative who has indicated his/her understanding and acceptance.     Dental Advisory Given  Plan Discussed with: CRNA  Anesthesia Plan Comments:        Anesthesia Quick Evaluation

## 2022-10-15 NOTE — Op Note (Signed)
[facility] Patient Name: Laura Acevedo Procedure Date: 10/15/2022 8:00 AM MRN: [id number] Date of Birth: 1932-03-15 Attending MD: Sanjuan Dame , MD, [id number] CSN: [id number] Age: 89 Admit Type: Outpatient Procedure:                Upper GI endoscopy Indications:              Dysphagia Providers:                Sanjuan Dame, MD, Buel Ream. Thomasena Edis RN, RN,                            Lennice Sites Technician, Pensions consultant Referring MD:              Medicines:                Monitored Anesthesia Care Complications:            No immediate complications. Estimated Blood Loss:     Estimated blood loss was minimal. Procedure:                Pre-Anesthesia Assessment:                           - Prior to the procedure, a History and Physical                            was performed, and patient medications and                            allergies were reviewed. The patient's tolerance of                            previous anesthesia was also reviewed. The risks                            and benefits of the procedure and the sedation                            options and risks were discussed with the patient.                            All questions were answered, and informed consent                            was obtained. Prior Anticoagulants: The patient has                            taken no anticoagulant or antiplatelet agents                            except for aspirin. ASA Grade Assessment: III - A                            patient with severe systemic disease. After  reviewing the risks and benefits, the patient was                            deemed in satisfactory condition to undergo the                            procedure.                           After obtaining informed consent, the endoscope was                            passed under direct vision. Throughout the                            procedure, the patient's blood pressure, pulse,  and                            oxygen saturations were monitored continuously. The                            [id number]-H190 ([id number]) scope was introduced through the                            mouth, and advanced to the second part of duodenum.                            The upper GI endoscopy was accomplished without                            difficulty. The patient tolerated the procedure                            well. Scope In: 9:30:24 AM Scope Out: 9:41:47 AM Total Procedure Duration: 0 hours 11 minutes 23 seconds  Findings:      A non-bleeding Zenker's diverticulum with a large opening, no impacted       food and no stigmata of recent bleeding was found.      The gastroesophageal flap valve was visualized endoscopically and       classified as Hill Grade III (minimal fold, loose to endoscope, hiatal       hernia likely).      A 3 cm hiatal hernia was present.      A single erosion with no bleeding and no stigmata of recent bleeding was       found in the gastric body. Biopsies were taken with a cold forceps for       histology. For hemostasis, one hemostatic clip was successfully placed       (MR conditional). Clip manufacturer: [employer]. There was no       bleeding at the end of the procedure.      The duodenal bulb and second portion of the duodenum were normal. Impression:               - Zenker's diverticulum.                           -  Gastroesophageal flap valve classified as Hill                            Grade III (minimal fold, loose to endoscope, hiatal                            hernia likely).                           - 3 cm hiatal hernia.                           - Gastric erosion with no bleeding and no stigmata                            of recent bleeding. Biopsied. Clip (MR conditional)                            was placed. Clip manufacturer: [employer].                           - Normal duodenal bulb and second portion of the                             duodenum. Moderate Sedation:      Per Anesthesia Care Recommendation:           - Patient has a contact number available for                            emergencies. The signs and symptoms of potential                            delayed complications were discussed with the                            patient. Return to normal activities tomorrow.                            Written discharge instructions were provided to the                            patient.                           - Resume previous diet.                           - Continue present medications.                           - Await pathology results.                           -Obtain esophagram                           -  Aspiration precautions                           - Cut food in small pieces and chew food thoroughly                            to avoid regurgitation episodes.                           - Discussed avoidance of eating within 3 hours of                            lying down to sleep and benefit of blocks to                            elevate head of bed. Also, will benefit from                            avoiding carbonated drinks/sodas or food that has                            tomatoes, spicy or greasy food. Procedure Code(s):        --- Professional ---                           [id number], 59, Esophagogastroduodenoscopy, flexible,                            transoral; with control of bleeding, any method                           43239, Esophagogastroduodenoscopy, flexible,                            transoral; with biopsy, single or multiple Diagnosis Code(s):        --- Professional ---                           K22.5, Diverticulum of esophagus, acquired                           K44.9, Diaphragmatic hernia without obstruction or                            gangrene                           K25.9, Gastric ulcer, unspecified as acute or                            chronic, without hemorrhage  or perforation                           R13.10, Dysphagia, unspecified CPT copyright 2022 American Medical Association. All rights reserved. The codes documented in this report are preliminary and upon coder review may  be revised to meet current compliance requirements. Sanjuan Dame, MD Sanjuan Dame, MD 10/15/2022 9:48:05 AM This report has been signed electronically. Number of Addenda: 0

## 2022-10-18 ENCOUNTER — Telehealth (INDEPENDENT_AMBULATORY_CARE_PROVIDER_SITE_OTHER): Payer: Self-pay | Admitting: *Deleted

## 2022-10-18 LAB — SURGICAL PATHOLOGY

## 2022-10-18 NOTE — Telephone Encounter (Signed)
Per EGD op note patient need esophagram

## 2022-10-18 NOTE — Telephone Encounter (Signed)
Esophagram scheduled for 10/27/22 at 3 pm (right after swallowing test). Attempted to reach pt but no answer

## 2022-10-19 ENCOUNTER — Ambulatory Visit (HOSPITAL_COMMUNITY): Payer: Medicare Other | Admitting: Speech Pathology

## 2022-10-20 ENCOUNTER — Encounter (INDEPENDENT_AMBULATORY_CARE_PROVIDER_SITE_OTHER): Payer: Self-pay | Admitting: *Deleted

## 2022-10-20 NOTE — Progress Notes (Signed)
I reviewed the pathology results. Ann, can you send her a letter with the findings as described below please? Thanks,  [name], MD Gastroenterology and Hepatology [facility]  ---------------------------------------------------------------------------------------------  [facility] [address], [address], [place], [place] [address] Phone:  [phone]   10/20/22 [place], [place]   Dear Marylen Ponto,  I am writing to inform you that the biopsies taken during your recent endoscopic examination showed:  No H. Pylori bacteria in stomach , or any early cancer changes to the stomach mucosa ( Intestinal metaplasia)   I recommend you have the esophagram ( xray) done which is already ordered.    Please call us at [phone] if you have persistent problems or have questions about your condition that have not been fully answered at this time.  Sincerely,  [name], MD Gastroenterology and Hepatology

## 2022-10-21 NOTE — Telephone Encounter (Signed)
No answer x2 

## 2022-10-25 ENCOUNTER — Encounter (HOSPITAL_COMMUNITY): Payer: Self-pay | Admitting: Gastroenterology

## 2022-10-26 NOTE — Telephone Encounter (Signed)
Pt left voicemail in regards to appt tomorrow at 2. Pt has swallow test and esophagram tomorrow at 2p and 3p. Returned call to patient and advised her of appts, location and time. Pt verbalized understanding.

## 2022-10-27 ENCOUNTER — Ambulatory Visit (HOSPITAL_COMMUNITY)
Admission: RE | Admit: 2022-10-27 | Discharge: 2022-10-27 | Disposition: A | Discharge status: Home / Self Care | Payer: Medicare Other | Source: Ambulatory Visit | Attending: Gastroenterology | Admitting: Gastroenterology

## 2022-10-27 ENCOUNTER — Encounter (HOSPITAL_COMMUNITY): Payer: Self-pay | Admitting: Speech Pathology

## 2022-10-27 ENCOUNTER — Ambulatory Visit (HOSPITAL_COMMUNITY): Payer: Medicare Other | Attending: Family Medicine | Admitting: Speech Pathology

## 2022-10-27 DIAGNOSIS — K225 Diverticulum of esophagus, acquired: Secondary | ICD-10-CM | POA: Insufficient documentation

## 2022-10-27 DIAGNOSIS — R1312 Dysphagia, oropharyngeal phase: Secondary | ICD-10-CM | POA: Insufficient documentation

## 2022-10-27 DIAGNOSIS — R059 Cough, unspecified: Secondary | ICD-10-CM | POA: Diagnosis not present

## 2022-10-27 NOTE — Therapy (Signed)
to epiglottic petiole Anterior hyoid excursion: Partial anterior movement Epiglottic movement: Complete inversion Laryngeal vestibule closure: Complete, no air/contrast in laryngeal vestibule Pharyngeal stripping wave : Present - diminished Pharyngeal contraction (A/P view only): N/A Pharyngoesophageal segment opening: Complete distension and complete duration, no obstruction of flow Tongue base retraction: Trace column of contrast or air between tongue base and PPW Pharyngeal residue: Trace residue within or on pharyngeal structures; Collection of residue within or on pharyngeal structures Location of pharyngeal residue: Valleculae; Pyriform sinuses     Esophageal Impairment Domain: Esophageal Impairment Domain Esophageal clearance upright position: -- (large Zenker's Diverticulum)    Pill: Pill Consistency administered: -- (not administered)    Penetration/Aspiration Scale Score: Penetration/Aspiration Scale Score 1.  Material does not enter airway: Thin liquids (Level 0); Mildly thick liquids (Level 2, nectar thick); Moderately thick liquids (Level 3, honey thick); Puree; Solid    Compensatory Strategies: Compensatory Strategies Compensatory strategies: Yes Chin tuck: Ineffective Left head turn: Ineffective Right head turn: Ineffective       General Information: Caregiver present: No   Diet Prior to this Study: Regular; Thin liquids (Level 0)    Temperature : Normal    Respiratory Status: WFL    Supplemental O2: None (Room air)    History of Recent Intubation: No   Behavior/Cognition: Alert; Cooperative; Pleasant mood  Self-Feeding Abilities: Able to self-feed  Baseline vocal quality/speech: Normal  Volitional Cough: Able to elicit  Volitional Swallow: Able to elicit  No data recorded  Goal Planning: No data recorded No data  recorded No data recorded No data recorded No data recorded  Pain: No data recorded  End of Session: Start Time:No data recorded Stop Time: No data recorded Time Calculation:No data recorded Charges: No data recorded SLP visit diagnosis: SLP Visit Diagnosis: Dysphagia, oropharyngeal phase (R13.12)    Past Medical History:  Past Medical History:  Diagnosis Date   Cancer (HCC) 1980   melanoma   Cataract    Colon polyps    Foot neuroma 2005   left   Hyperlipidemia    Hypertension    Osteoporosis    Past Surgical History:  Past Surgical History:  Procedure Laterality Date   BIOPSY  10/15/2022   Procedure: BIOPSY;  Surgeon: Franky Macho, MD;  [facility]: [facility];  Service: Endoscopy;;   CATARACT EXTRACTION     ESOPHAGOGASTRODUODENOSCOPY (EGD) WITH PROPOFOL N/A 10/15/2022   Procedure: ESOPHAGOGASTRODUODENOSCOPY (EGD) WITH PROPOFOL;  Surgeon: Franky Macho, MD;  Location: [facility];  Service: Endoscopy;  Laterality: N/A;  9:15am;asa 3   FOOT NEUROMA SURGERY Left 2005   Dr Ulice Brilliant   MELANOMA EXCISION Left    leg   Trajon Rosete H. Romie Levee, CCC-SLP Speech Language Pathologist]   Georgetta Haber 10/27/2022, 3:06 PM Georgetta Haber, CCC-SLP 10/27/2022, 3:06 PM  [facility] [address] [place], [place], [address] Phone: [phone]   Fax:  [phone]  Name: Laura Acevedo MRN: [id number] Date of Birth: December 21, 1932  [facility] at [facility] [address] [place], [place], [address] Phone: [phone]   Fax:  [phone]  Modified Barium Swallow  Patient Details  Name: Laura Acevedo MRN: [id number] Date of Birth: 02-17-1932 No data recorded  Encounter Date: 10/27/2022   End of Session - 10/27/22 1505     Visit Number 1    Number of Visits 1    Activity Tolerance Patient tolerated treatment well            HPI/PMH: HPI: Laura Acevedo is a 89 y.o. female with hypertension, hyperlipidemia, generalized anxiety. Upper Endoscopy completed 10/15/22 found non- bleeding Zenker' s diverticulum with a large opening and a 3 cm hiatal hernia. MBSS requested.   Clinical Impression: Pt presents with mild pharyngeal dysphagia. Pt with a large Zenker's diverticulum that filled with barium on the first sip and remained full throughout the study. Note adequate laryngeal vestibule closure and slightly diminished pharyngeal stripping wave which results in inconsistent trace to min pharyngeal residue. Trace to min retrograde flow was visualized with puree and regular textures from the Zenker's Diverticulum but was reflexively swallowed every time and again note good laryngeal vestibule closure and no penetration or aspiration of regurgitated material. Pt reports regurgitating her pills after hours of swallowing them. Recommend crushing pills as able. Recommend continue with soft/chopped diet as recommended by GI on recent Upper endoscopy on 10/15/22 and also echo other esophageal precautions: avoidance of eating within 3 hours of lying down to sleep and benefit of blocks to elevate head of bed. Also, will benefit from avoiding carbonated drinks/ sodas or food that has tomatoes, spicy or greasy food.  Also recommend multiple repeat/dry swallows with each bite or sip. Thank you for this referral,  Factors that may increase risk of adverse event in presence of aspiration Rubye Oaks & Clearance Coots  2021): No data recorded  Recommendations/Plan: Swallowing Evaluation Recommendations Swallowing Evaluation Recommendations Recommendations: PO diet PO Diet Recommendation: Dysphagia 3 (Mechanical soft); Thin liquids (Level 0) Liquid Administration via: Cup; Straw Medication Administration: Crushed with puree Supervision: Patient able to self-feed Swallowing strategies  : Multiple dry swallows after each bite/sip (Esophageal precautions) Postural changes: Position pt fully upright for meals; Stay upright 30-60 min after meals Oral care recommendations: Oral care QID (4x/day)    Treatment Plan No data recorded   Recommendations Recommendations for follow up therapy are one component of a multi-disciplinary discharge planning process, led by the attending physician.  Recommendations may be updated based on patient status, additional functional criteria and insurance authorization.  Assessment: Orofacial Exam: Orofacial Exam Oral Cavity: Oral Hygiene: WFL Oral Cavity - Dentition: Adequate natural dentition Orofacial Anatomy: WFL Oral Motor/Sensory Function: WFL    Anatomy:  Anatomy: WFL   Boluses Administered: Boluses Administered Boluses Administered: Thin liquids (Level 0); Mildly thick liquids (Level 2, nectar thick); Moderately thick liquids (Level 3, honey thick); Puree; Solid     Oral Impairment Domain: Oral Impairment Domain Lip Closure: No labial escape Tongue control during bolus hold: Cohesive bolus between tongue to palatal seal Bolus preparation/mastication: Timely and efficient chewing and mashing Bolus transport/lingual motion: Brisk tongue motion Oral residue: Complete oral clearance; Trace residue lining oral structures Location of oral residue : Tongue Initiation of pharyngeal swallow : Posterior angle of the ramus     Pharyngeal Impairment Domain: Pharyngeal Impairment Domain Soft palate elevation: No bolus between soft palate (SP)/pharyngeal  wall (PW) Laryngeal elevation: Complete superior movement of thyroid cartilage with complete approximation of arytenoids

## 2022-10-28 NOTE — Progress Notes (Signed)
Hi Laura Acevedo can you please call and inform the patient that Esophagram showed Zenkers Diverticulum  Zenker's diverticulum is a small pouch that can form in the throat, specifically in an area near the esophagus. It usually happens when the muscles in that part become weak. This pouch can trap food or liquids, which may cause swallowing difficulties or a sensation of something stuck in the throat.   I recommend that you see ENT at [facility] and can refer you to them

## 2022-11-11 ENCOUNTER — Encounter (INDEPENDENT_AMBULATORY_CARE_PROVIDER_SITE_OTHER): Payer: Self-pay | Admitting: *Deleted

## 2022-11-12 ENCOUNTER — Ambulatory Visit (INDEPENDENT_AMBULATORY_CARE_PROVIDER_SITE_OTHER): Payer: Medicare Other | Admitting: *Deleted

## 2022-11-12 DIAGNOSIS — M810 Age-related osteoporosis without current pathological fracture: Secondary | ICD-10-CM

## 2022-11-12 MED ORDER — DENOSUMAB 60 MG/ML ~~LOC~~ SOSY
60.0000 mg | PREFILLED_SYRINGE | Freq: Once | SUBCUTANEOUS | Status: AC
Start: 2022-11-12 — End: 2022-11-12
  Administered 2022-11-12: 60 mg via SUBCUTANEOUS

## 2022-11-22 ENCOUNTER — Ambulatory Visit (INDEPENDENT_AMBULATORY_CARE_PROVIDER_SITE_OTHER): Payer: Medicare Other

## 2022-11-22 DIAGNOSIS — Z23 Encounter for immunization: Secondary | ICD-10-CM

## 2022-12-02 DIAGNOSIS — Z1283 Encounter for screening for malignant neoplasm of skin: Secondary | ICD-10-CM | POA: Diagnosis not present

## 2022-12-02 DIAGNOSIS — Z08 Encounter for follow-up examination after completed treatment for malignant neoplasm: Secondary | ICD-10-CM | POA: Diagnosis not present

## 2022-12-02 DIAGNOSIS — L820 Inflamed seborrheic keratosis: Secondary | ICD-10-CM | POA: Diagnosis not present

## 2022-12-02 DIAGNOSIS — X32XXXD Exposure to sunlight, subsequent encounter: Secondary | ICD-10-CM | POA: Diagnosis not present

## 2022-12-02 DIAGNOSIS — L570 Actinic keratosis: Secondary | ICD-10-CM | POA: Diagnosis not present

## 2022-12-02 DIAGNOSIS — D225 Melanocytic nevi of trunk: Secondary | ICD-10-CM | POA: Diagnosis not present

## 2022-12-02 DIAGNOSIS — Z85820 Personal history of malignant melanoma of skin: Secondary | ICD-10-CM | POA: Diagnosis not present

## 2022-12-09 DIAGNOSIS — L601 Onycholysis: Secondary | ICD-10-CM | POA: Diagnosis not present

## 2022-12-09 DIAGNOSIS — I70203 Unspecified atherosclerosis of native arteries of extremities, bilateral legs: Secondary | ICD-10-CM | POA: Diagnosis not present

## 2022-12-09 DIAGNOSIS — M79676 Pain in unspecified toe(s): Secondary | ICD-10-CM | POA: Diagnosis not present

## 2022-12-09 DIAGNOSIS — B351 Tinea unguium: Secondary | ICD-10-CM | POA: Diagnosis not present

## 2022-12-09 DIAGNOSIS — L84 Corns and callosities: Secondary | ICD-10-CM | POA: Diagnosis not present

## 2023-03-04 ENCOUNTER — Ambulatory Visit: Payer: Medicare Other | Admitting: Nurse Practitioner

## 2023-03-04 ENCOUNTER — Encounter: Payer: Self-pay | Admitting: Nurse Practitioner

## 2023-03-04 VITALS — BP 126/70 | HR 88 | Temp 97.7°F | Ht 65.0 in | Wt 115.8 lb

## 2023-03-04 DIAGNOSIS — F411 Generalized anxiety disorder: Secondary | ICD-10-CM | POA: Diagnosis not present

## 2023-03-04 DIAGNOSIS — E782 Mixed hyperlipidemia: Secondary | ICD-10-CM

## 2023-03-04 DIAGNOSIS — I10 Essential (primary) hypertension: Secondary | ICD-10-CM | POA: Diagnosis not present

## 2023-03-04 DIAGNOSIS — M810 Age-related osteoporosis without current pathological fracture: Secondary | ICD-10-CM | POA: Diagnosis not present

## 2023-03-04 LAB — CBC WITH DIFFERENTIAL/PLATELET
Basophils Absolute: 0.1 10*3/uL (ref 0.0–0.2)
Basos: 1 %
EOS (ABSOLUTE): 0.2 10*3/uL (ref 0.0–0.4)
Eos: 2 %
Hematocrit: 40.5 % (ref 34.0–46.6)
Hemoglobin: 13.6 g/dL (ref 11.1–15.9)
Immature Grans (Abs): 0 10*3/uL (ref 0.0–0.1)
Immature Granulocytes: 0 %
Lymphocytes Absolute: 1.4 10*3/uL (ref 0.7–3.1)
Lymphs: 16 %
MCH: 32.4 pg (ref 26.6–33.0)
MCHC: 33.6 g/dL (ref 31.5–35.7)
MCV: 96 fL (ref 79–97)
Monocytes Absolute: 0.7 10*3/uL (ref 0.1–0.9)
Monocytes: 8 %
Neutrophils Absolute: 6.2 10*3/uL (ref 1.4–7.0)
Neutrophils: 73 %
Platelets: 322 10*3/uL (ref 150–450)
RBC: 4.2 x10E6/uL (ref 3.77–5.28)
RDW: 12.3 % (ref 11.7–15.4)
WBC: 8.5 10*3/uL (ref 3.4–10.8)

## 2023-03-04 LAB — CMP14+EGFR
ALT: 44 [IU]/L — ABNORMAL HIGH (ref 0–32)
AST: 29 [IU]/L (ref 0–40)
Albumin: 4.1 g/dL (ref 3.7–4.7)
Alkaline Phosphatase: 107 [IU]/L (ref 44–121)
BUN/Creatinine Ratio: 22 (ref 12–28)
BUN: 24 mg/dL (ref 8–27)
Bilirubin Total: 0.8 mg/dL (ref 0.0–1.2)
CO2: 25 mmol/L (ref 20–29)
Calcium: 10.3 mg/dL (ref 8.7–10.3)
Chloride: 99 mmol/L (ref 96–106)
Creatinine, Ser: 1.08 mg/dL — ABNORMAL HIGH (ref 0.57–1.00)
Globulin, Total: 2.5 g/dL (ref 1.5–4.5)
Glucose: 109 mg/dL — ABNORMAL HIGH (ref 70–99)
Potassium: 4.2 mmol/L (ref 3.5–5.2)
Sodium: 140 mmol/L (ref 134–144)
Total Protein: 6.6 g/dL (ref 6.0–8.5)
eGFR: 49 mL/min/{1.73_m2} — ABNORMAL LOW (ref >59)

## 2023-03-04 LAB — LIPID PANEL
Chol/HDL Ratio: 2.7 {ratio} (ref 0.0–4.4)
Cholesterol, Total: 200 mg/dL — ABNORMAL HIGH (ref 100–199)
HDL: 75 mg/dL (ref >39)
LDL Chol Calc (NIH): 103 mg/dL — ABNORMAL HIGH (ref 0–99)
Triglycerides: 127 mg/dL (ref 0–149)
VLDL Cholesterol Cal: 22 mg/dL (ref 5–40)

## 2023-03-04 MED ORDER — LISINOPRIL-HYDROCHLOROTHIAZIDE 10-12.5 MG PO TABS
1.0000 | ORAL_TABLET | Freq: Every day | ORAL | 1 refills | Status: DC
Start: 2023-03-04 — End: 2023-08-05

## 2023-03-04 MED ORDER — ATORVASTATIN CALCIUM 20 MG PO TABS: 20.0000 mg | ORAL_TABLET | Freq: Every day | ORAL | 1 refills | Status: DC

## 2023-03-04 NOTE — Patient Instructions (Signed)
Fall Prevention in the Home, Adult Falls can cause injuries and can happen to people of all ages. There are many things you can do to make your home safer and to help prevent falls. What actions can I take to prevent falls? General information Use good lighting in all rooms. Make sure to: Replace any light bulbs that burn out. Turn on the lights in dark areas and use night-lights. Keep items that you use often in easy-to-reach places. Lower the shelves around your home if needed. Move furniture so that there are clear paths around it. Do not use throw rugs or other things on the floor that can make you trip. If any of your floors are uneven, fix them. Add color or contrast paint or tape to clearly mark and help you see: Grab bars or handrails. First and last steps of staircases. Where the edge of each step is. If you use a ladder or stepladder: Make sure that it is fully opened. Do not climb a closed ladder. Make sure the sides of the ladder are locked in place. Have someone hold the ladder while you use it. Know where your pets are as you move through your home. What can I do in the bathroom?     Keep the floor dry. Clean up any water on the floor right away. Remove soap buildup in the bathtub or shower. Buildup makes bathtubs and showers slippery. Use non-skid mats or decals on the floor of the bathtub or shower. Attach bath mats securely with double-sided, non-slip rug tape. If you need to sit down in the shower, use a non-slip stool. Install grab bars by the toilet and in the bathtub and shower. Do not use towel bars as grab bars. What can I do in the bedroom? Make sure that you have a light by your bed that is easy to reach. Do not use any sheets or blankets on your bed that hang to the floor. Have a firm chair or bench with side arms that you can use for support when you get dressed. What can I do in the kitchen? Clean up any spills right away. If you need to reach something  above you, use a step stool with a grab bar. Keep electrical cords out of the way. Do not use floor polish or wax that makes floors slippery. What can I do with my stairs? Do not leave anything on the stairs. Make sure that you have a light switch at the top and the bottom of the stairs. Make sure that there are handrails on both sides of the stairs. Fix handrails that are broken or loose. Install non-slip stair treads on all your stairs if they do not have carpet. Avoid having throw rugs at the top or bottom of the stairs. Choose a carpet that does not hide the edge of the steps on the stairs. Make sure that the carpet is firmly attached to the stairs. Fix carpet that is loose or worn. What can I do on the outside of my home? Use bright outdoor lighting. Fix the edges of walkways and driveways and fix any cracks. Clear paths of anything that can make you trip, such as tools or rocks. Add color or contrast paint or tape to clearly mark and help you see anything that might make you trip as you walk through a door, such as a raised step or threshold. Trim any bushes or trees on paths to your home. Check to see if handrails are loose  or broken and that both sides of all steps have handrails. Install guardrails along the edges of any raised decks and porches. Have leaves, snow, or ice cleared regularly. Use sand, salt, or ice melter on paths if you live where there is ice and snow during the winter. Clean up any spills in your garage right away. This includes grease or oil spills. What other actions can I take? Review your medicines with your doctor. Some medicines can cause dizziness or changes in blood pressure, which increase your risk of falling. Wear shoes that: Have a low heel. Do not wear high heels. Have rubber bottoms and are closed at the toe. Feel good on your feet and fit well. Use tools that help you move around if needed. These include: Canes. Walkers. Scooters. Crutches. Ask  your doctor what else you can do to help prevent falls. This may include seeing a physical therapist to learn to do exercises to move better and get stronger. Where to find more information Centers for Disease Control and Prevention, [employer]: TonerPromos.no [employer] on Aging: BaseRingTones.pl National Institute on Aging: BaseRingTones.pl Contact a doctor if: You are afraid of falling at home. You feel weak, drowsy, or dizzy at home. You fall at home. Get help right away if you: Lose consciousness or have trouble moving after a fall. Have a fall that causes a head injury. These symptoms may be an emergency. Get help right away. Call 911. Do not wait to see if the symptoms will go away. Do not drive yourself to the hospital. This information is not intended to replace advice given to you by your health care provider. Make sure you discuss any questions you have with your health care provider. Document Revised: 09/07/2021 Document Reviewed: 09/07/2021 Elsevier Patient Education  2024 [employer].

## 2023-03-04 NOTE — Progress Notes (Signed)
Subjective:    Patient ID: Laura Acevedo, female    DOB: [birth year], 89 y.o.   MRN: [id number]   Chief Complaint: medical management of chronic issues     HPI:  Laura Acevedo is a 89 y.o. who identifies as a female who was assigned female at birth.   Social history: Lives with: by herself Work history: retired   Water engineer in today for follow up of the following chronic medical issues:  1. Essential hypertension, benign No c/o chest pain, sob or headache. Does not check blood pressure at home. BP Readings from Last 3 Encounters:  10/15/22 121/60  09/15/22 (!) 162/75  09/03/22 131/70     2. Mixed hyperlipidemia Does try to watch diet. Does no dedicated exercise. Lab Results  Component Value Date   CHOL 164 09/03/2022   HDL 97 09/03/2022   LDLCALC 52 09/03/2022   TRIG 82 09/03/2022   CHOLHDL 1.7 09/03/2022      3. GAD (generalized anxiety disorder) Is on no medications. 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    09/03/2022   10:29 AM 03/09/2022   10:55 AM 08/27/2021   10:17 AM 02/26/2021   10:31 AM  GAD 7 : Generalized Anxiety Score  Nervous, Anxious, on Edge 0 0 0 1  Control/stop worrying 0 0 0 1  Worry too much - different things 0 0 0 1  Trouble relaxing 0 0 0 1  Restless 0 0 0 0  Easily annoyed or irritable 0 0 0 0  Afraid - awful might happen 0 0 0 0  Total GAD 7 Score 0 0 0 4  Anxiety Difficulty Not difficult at all Not difficult at all Not difficult at all Somewhat difficult      4. Age-related osteoporosis without current pathological fracture Last dexascan was done on 02/19/19. T score was -2.1- refuses to repeat   Wt Readings from Last 3 Encounters:  03/04/23 115 lb 12.8 oz (52.5 kg)  10/15/22 119 lb (54 kg)  09/15/22 119 lb 9.6 oz (54.3 kg)   BMI Readings from Last 3 Encounters:  03/04/23 19.27 kg/m  10/15/22 19.80 kg/m  09/15/22 19.90 kg/m    New complaints: None today  No Known Allergies Outpatient Encounter Medications as of 03/04/2023   Medication Sig   aspirin EC 81 MG tablet Take 81 mg by mouth daily.   atorvastatin (LIPITOR) 20 MG tablet Take 1 tablet (20 mg total) by mouth daily.   calcium carbonate (OS-CAL) 600 MG TABS Take 600 mg by mouth daily. AT LUNCH   cholecalciferol (VITAMIN D) 1000 UNITS tablet Take 1,000 Units by mouth daily.    denosumab (PROLIA) 60 MG/ML SOLN injection Inject 60 mg into the skin every 6 (six) months. Administer in upper arm, thigh, or abdomen   lisinopril-hydrochlorothiazide (ZESTORETIC) 10-12.5 MG tablet Take 1 tablet by mouth daily.   Multiple Vitamins-Minerals (CENTRUM SILVER PO) Take 1 tablet by mouth every morning.   Multiple Vitamins-Minerals (PRESERVISION AREDS 2 PO) Take by mouth 2 (two) times daily.   No facility-administered encounter medications on file as of 03/04/2023.    Past Surgical History:  Procedure Laterality Date   BIOPSY  10/15/2022   Procedure: BIOPSY;  Surgeon: Franky Macho, MD;  Location: [facility];  Service: Endoscopy;;   CATARACT EXTRACTION     ESOPHAGOGASTRODUODENOSCOPY (EGD) WITH PROPOFOL N/A 10/15/2022   Procedure: ESOPHAGOGASTRODUODENOSCOPY (EGD) WITH PROPOFOL;  Surgeon: Franky Macho, MD;  Location: [facility];  Service: Endoscopy;  Laterality: N/A;  9:15am;asa 3   FOOT NEUROMA SURGERY Left 2005   Dr Ulice Brilliant   MELANOMA EXCISION Left    leg    Family History  Problem Relation Age of Onset   Diabetes Mother    Heart disease Mother    Heart disease Father    Heart attack Father    Kidney disease Father    Hypertension Sister    Diabetes Brother    Hypertension Brother    Cancer Brother        bladder    Diabetes Brother    Diabetes Brother    Colon cancer Neg Hx       Controlled substance contract: n/a     Review of Systems  Constitutional:  Negative for diaphoresis.  Eyes:  Negative for pain.  Respiratory:  Negative for shortness of breath.   Cardiovascular:  Negative for chest pain, palpitations and leg swelling.   Gastrointestinal:  Negative for abdominal pain.  Endocrine: Negative for polydipsia.  Skin:  Negative for rash.  Neurological:  Negative for dizziness, weakness and headaches.  Hematological:  Does not bruise/bleed easily.  All other systems reviewed and are negative.      Objective:   Physical Exam Vitals and nursing note reviewed.  Constitutional:      General: She is not in acute distress.    Appearance: Normal appearance. She is well-developed.  HENT:     Head: Normocephalic.     Right Ear: Tympanic membrane normal.     Left Ear: Tympanic membrane normal.     Nose: Nose normal.     Mouth/Throat:     Mouth: Mucous membranes are moist.  Eyes:     Pupils: Pupils are equal, round, and reactive to light.  Neck:     Vascular: No carotid bruit or JVD.  Cardiovascular:     Rate and Rhythm: Normal rate and regular rhythm.     Heart sounds: Normal heart sounds.  Pulmonary:     Effort: Pulmonary effort is normal. No respiratory distress.     Breath sounds: Normal breath sounds. No wheezing or rales.  Chest:     Chest wall: No tenderness.  Abdominal:     General: Bowel sounds are normal. There is no distension or abdominal bruit.     Palpations: Abdomen is soft. There is no hepatomegaly, splenomegaly, mass or pulsatile mass.     Tenderness: There is no abdominal tenderness.  Musculoskeletal:        General: Normal range of motion.     Cervical back: Normal range of motion and neck supple.  Lymphadenopathy:     Cervical: No cervical adenopathy.  Skin:    General: Skin is warm and dry.  Neurological:     Mental Status: She is alert and oriented to person, place, and time.     Deep Tendon Reflexes: Reflexes are normal and symmetric.  Psychiatric:        Behavior: Behavior normal.        Thought Content: Thought content normal.        Judgment: Judgment normal.     BP 126/70   Pulse 88   Temp 97.7 F (36.5 C)   Ht 5\' 5"  (1.651 m)   Wt 115 lb 12.8 oz (52.5 kg)   SpO2  98%   BMI 19.27 kg/m         Assessment & Plan:   Laura Acevedo comes in today with chief complaint of No chief complaint on  file.   Diagnosis and orders addressed:  1. Essential hypertension, benign Low sodium diet - lisinopril-hydrochlorothiazide (ZESTORETIC) 10-12.5 MG tablet; Take 1 tablet by mouth daily.  Dispense: 100 tablet; Refill: 2 - CBC with Differential/Platelet - CMP14+EGFR  2. Mixed hyperlipidemia Low fat diet - atorvastatin (LIPITOR) 20 MG tablet; Take 1 tablet (20 mg total) by mouth daily.  Dispense: 100 tablet; Refill: 2 - Lipid panel  3. GAD (generalized anxiety disorder) Stress management  4. Age-related osteoporosis without current pathological fracture Weight bearing exercise.   Labs pending Health Maintenance reviewed Diet and exercise encouraged  Follow up plan: 6 months   Elgie-Margaret Daphine Deutscher, FNP

## 2023-03-17 DIAGNOSIS — L84 Corns and callosities: Secondary | ICD-10-CM | POA: Diagnosis not present

## 2023-03-17 DIAGNOSIS — I70203 Unspecified atherosclerosis of native arteries of extremities, bilateral legs: Secondary | ICD-10-CM | POA: Diagnosis not present

## 2023-03-17 DIAGNOSIS — L603 Nail dystrophy: Secondary | ICD-10-CM | POA: Diagnosis not present

## 2023-03-28 ENCOUNTER — Telehealth: Payer: Self-pay

## 2023-03-28 DIAGNOSIS — M810 Age-related osteoporosis without current pathological fracture: Secondary | ICD-10-CM

## 2023-03-28 MED ORDER — DENOSUMAB 60 MG/ML ~~LOC~~ SOSY
60.0000 mg | PREFILLED_SYRINGE | Freq: Once | SUBCUTANEOUS | Status: AC
Start: 2023-04-11 — End: 2023-05-19
  Administered 2023-05-19: 60 mg via SUBCUTANEOUS

## 2023-03-28 NOTE — Telephone Encounter (Signed)
 Prolia ordered for PA team to review verification benefits

## 2023-03-31 DIAGNOSIS — X32XXXD Exposure to sunlight, subsequent encounter: Secondary | ICD-10-CM | POA: Diagnosis not present

## 2023-03-31 DIAGNOSIS — D225 Melanocytic nevi of trunk: Secondary | ICD-10-CM | POA: Diagnosis not present

## 2023-03-31 DIAGNOSIS — C4401 Basal cell carcinoma of skin of lip: Secondary | ICD-10-CM | POA: Diagnosis not present

## 2023-03-31 DIAGNOSIS — Z1283 Encounter for screening for malignant neoplasm of skin: Secondary | ICD-10-CM | POA: Diagnosis not present

## 2023-03-31 DIAGNOSIS — L570 Actinic keratosis: Secondary | ICD-10-CM | POA: Diagnosis not present

## 2023-03-31 DIAGNOSIS — Z85820 Personal history of malignant melanoma of skin: Secondary | ICD-10-CM | POA: Diagnosis not present

## 2023-03-31 DIAGNOSIS — Z08 Encounter for follow-up examination after completed treatment for malignant neoplasm: Secondary | ICD-10-CM | POA: Diagnosis not present

## 2023-04-20 ENCOUNTER — Telehealth: Payer: Self-pay

## 2023-04-20 NOTE — Telephone Encounter (Signed)
 Prolia VOB initiated via [email or web address]  Next Prolia inj DUE: 05/12/23

## 2023-04-22 ENCOUNTER — Other Ambulatory Visit (HOSPITAL_COMMUNITY): Payer: Self-pay

## 2023-04-22 NOTE — Telephone Encounter (Signed)
 Marland Kitchen

## 2023-04-22 NOTE — Telephone Encounter (Signed)
 Pt ready for scheduling for PROLIA on or after : 05/12/23  Option# 1: Buy/Bill (Office supplied medication)  Out-of-pocket cost due at time of clinic visit: $362  Number of injection/visits approved: 2  Primary: [employer] MEDICARE Prolia co-insurance: 20% Admin fee co-insurance: $30  Secondary: --- Prolia co-insurance:  Admin fee co-insurance:   Medical Benefit Details: Date Benefits were checked: 04/20/23 Deductible: NO/ Coinsurance: 20%/ Admin Fee: $30  Prior Auth: APPROVED PA# [id number] Expiration Date: 09/06/22-09/06/23  # of doses approved: 2 ----------------------------------------------------------------------- Option# 2- Med Obtained from pharmacy:  Pharmacy benefit: Copay $640 (Paid to pharmacy) Admin Fee: $30 (Pay at clinic)  Prior Auth: N/A PA# Expiration Date:   # of doses approved:   If patient wants fill through the pharmacy benefit please send prescription to: [facility], and include estimated need by date in rx notes. Pharmacy will ship medication directly to the office.  Patient NOT eligible for Prolia Copay Card. Copay Card can make patient's cost as little as $25. Link to apply: [email or web address]://[email or web address]  ** This summary of benefits is an estimation of the patient's out-of-pocket cost. Exact cost may very based on individual plan coverage.

## 2023-05-12 DIAGNOSIS — Z85828 Personal history of other malignant neoplasm of skin: Secondary | ICD-10-CM | POA: Diagnosis not present

## 2023-05-12 DIAGNOSIS — Z08 Encounter for follow-up examination after completed treatment for malignant neoplasm: Secondary | ICD-10-CM | POA: Diagnosis not present

## 2023-05-19 ENCOUNTER — Ambulatory Visit

## 2023-05-19 DIAGNOSIS — M810 Age-related osteoporosis without current pathological fracture: Secondary | ICD-10-CM | POA: Diagnosis not present

## 2023-05-19 NOTE — Progress Notes (Signed)
 Patient is in office today for a nurse visit for  Prolia injection.   . Patient Injection was given in the  Right arm. Patient tolerated injection well.

## 2023-06-09 DIAGNOSIS — L84 Corns and callosities: Secondary | ICD-10-CM | POA: Diagnosis not present

## 2023-06-09 DIAGNOSIS — L603 Nail dystrophy: Secondary | ICD-10-CM | POA: Diagnosis not present

## 2023-06-09 DIAGNOSIS — M79676 Pain in unspecified toe(s): Secondary | ICD-10-CM | POA: Diagnosis not present

## 2023-06-09 DIAGNOSIS — I70203 Unspecified atherosclerosis of native arteries of extremities, bilateral legs: Secondary | ICD-10-CM | POA: Diagnosis not present

## 2023-06-21 ENCOUNTER — Ambulatory Visit: Payer: Medicare Other

## 2023-06-21 VITALS — BP 126/70 | HR 88 | Ht 65.0 in | Wt 115.0 lb

## 2023-06-21 DIAGNOSIS — Z0000 Encounter for general adult medical examination without abnormal findings: Secondary | ICD-10-CM | POA: Diagnosis not present

## 2023-06-21 NOTE — Progress Notes (Addendum)
 Subjective:   Laura Acevedo is a 89 y.o. who presents for a Medicare Wellness preventive visit.  As a reminder, Annual Wellness Visits don't include a physical exam, and some assessments may be limited, especially if this visit is performed virtually. We may recommend an in-person follow-up visit with your provider if needed.  Visit Complete: Virtual I connected with  Laura Acevedo on 06/21/23 by a audio enabled telemedicine application and verified that I am speaking with the correct person using two identifiers.  Patient Location: Home  Provider Location: Home Office  I discussed the limitations of evaluation and management by telemedicine. The patient expressed understanding and agreed to proceed.  Vital Signs: Because this visit was a virtual/telehealth visit, some criteria may be missing or patient reported. Any vitals not documented were not able to be obtained and vitals that have been documented are patient reported.  VideoDeclined- This patient declined Librarian, academic. Therefore the visit was completed with audio only.  Persons Participating in Visit: Patient.  AWV Questionnaire: No: Patient Medicare AWV questionnaire was not completed prior to this visit.  Cardiac Risk Factors include: advanced age (>7men, >19 women);hypertension;dyslipidemia     Objective:     Today's Vitals   06/21/23 1210  BP: 126/70  Pulse: 88  Weight: 115 lb (52.2 kg)  Height: 5\' 5"  (1.651 m)   Body mass index is 19.14 kg/m.     06/21/2023   12:17 PM 10/15/2022    7:14 AM 10/13/2022    1:12 PM 03/02/2022    2:50 PM 02/27/2021    4:24 PM 02/26/2020    1:26 PM 10/03/2018   11:20 AM  Advanced Directives  Does Patient Have a Medical Advance Directive? Yes No Yes Yes Yes No No  Type of Best boy of [place];Living will Healthcare Power of [place];Living will Healthcare Power of [place];Living will    Does patient want to make changes to  medical advance directive?    No - Patient declined     Copy of Healthcare Power of Attorney in Chart? Yes - validated most recent copy scanned in chart (See row information)  No - copy requested Yes - validated most recent copy scanned in chart (See row information) Yes - validated most recent copy scanned in chart (See row information)    Would patient like information on creating a medical advance directive?  No - Patient declined    No - Patient declined No - Patient declined    Current Medications (verified) Outpatient Encounter Medications as of 06/21/2023  Medication Sig   aspirin EC 81 MG tablet Take 81 mg by mouth daily.   atorvastatin  (LIPITOR) 20 MG tablet Take 1 tablet (20 mg total) by mouth daily.   calcium  carbonate (OS-CAL) 600 MG TABS Take 600 mg by mouth daily. AT LUNCH   cholecalciferol (VITAMIN D ) 1000 UNITS tablet Take 1,000 Units by mouth daily.    denosumab  (PROLIA ) 60 MG/ML SOLN injection Inject 60 mg into the skin every 6 (six) months. Administer in upper arm, thigh, or abdomen   lisinopril -hydrochlorothiazide  (ZESTORETIC ) 10-12.5 MG tablet Take 1 tablet by mouth daily.   Multiple Vitamins-Minerals (CENTRUM SILVER PO) Take 1 tablet by mouth every morning.   Multiple Vitamins-Minerals (PRESERVISION AREDS 2 PO) Take by mouth 2 (two) times daily.   No facility-administered encounter medications on file as of 06/21/2023.    Allergies (verified) Patient has no known allergies.   History: Past Medical History:  Diagnosis Date   Cancer [facility]) 1980   melanoma   Cataract    Colon polyps    Foot neuroma 2005   left   Hyperlipidemia    Hypertension    Osteoporosis    Past Surgical History:  Procedure Laterality Date   BIOPSY  10/15/2022   Procedure: BIOPSY;  Surgeon: Hargis Lias, MD;  [facility];  Service: Endoscopy;;   CATARACT EXTRACTION     ESOPHAGOGASTRODUODENOSCOPY (EGD) WITH PROPOFOL  N/A 10/15/2022   Procedure: ESOPHAGOGASTRODUODENOSCOPY (EGD)  WITH PROPOFOL ;  Surgeon: Hargis Lias, MD;  Location[facility];  Service: Endoscopy;  Laterality: N/A;  9:15am;asa 3   FOOT NEUROMA SURGERY Left 2005   Dr Abbey Abbe   MELANOMA EXCISION Left    leg   Family History  Problem Relation Age of Onset   Diabetes Mother    Heart disease Mother    Heart disease Father    Heart attack Father    Kidney disease Father    Hypertension Sister    Diabetes Brother    Hypertension Brother    Cancer Brother        bladder    Diabetes Brother    Diabetes Brother    Colon cancer Neg Hx    Social History   Socioeconomic History   Marital status: Widowed    Spouse name: John   Number of children: 0   Years of education: 12   Highest education level: 12th grade  Occupational History   Occupation: retired    Comment: Designer, fashion/clothing  Tobacco Use   Smoking status: Never    Passive exposure: Never   Smokeless tobacco: Never  Vaping Use   Vaping status: Never Used  Substance and Sexual Activity   Alcohol use: No   Drug use: No   Sexual activity: Not Currently  Other Topics Concern   Not on file  Social History Narrative   Husband passed 10/2020   Social Drivers of Health   Financial Resource Strain: Low Risk  (06/21/2023)   Overall Financial Resource Strain (CARDIA)    Difficulty of Paying Living Expenses: Not hard at all  Food Insecurity: No Food Insecurity (06/21/2023)   Hunger Vital Sign    Worried About Running Out of Food in the Last Year: Never true    Ran Out of Food in the Last Year: Never true  Transportation Needs: No Transportation Needs (06/21/2023)   PRAPARE - Administrator, Civil Service (Medical): No    Lack of Transportation (Non-Medical): No  Physical Activity: Insufficiently Active (06/21/2023)   Exercise Vital Sign    Days of Exercise per Week: 3 days    Minutes of Exercise per Session: 10 min  Stress: No Stress Concern Present (06/21/2023)   [employer] of Occupational Health - Occupational Stress  Questionnaire    Feeling of Stress : Not at all  Social Connections: Moderately Isolated (06/21/2023)   Social Connection and Isolation Panel [NHANES]    Frequency of Communication with Friends and Family: More than three times a week    Frequency of Social Gatherings with Friends and Family: More than three times a week    Attends Religious Services: More than 4 times per year    Active Member of [employer] or Organizations: No    Attends Banker Meetings: Never    Marital Status: Widowed    Tobacco Counseling Counseling given: Yes    Clinical Intake:  Pre-visit preparation completed: Yes  Pain :  No/denies pain     BMI - recorded: 19.14 Nutritional Status: BMI of 19-24  Normal Nutritional Risks: None Diabetes: No  No results found for: "HGBA1C"   How often do you need to have someone help you when you read instructions, pamphlets, or other written materials from your doctor or pharmacy?: 1 - Never  Interpreter Needed?: No  Information entered by :: Alia T/cma   Activities of Daily Living     06/21/2023   12:14 PM 10/13/2022    1:12 PM  In your present state of health, do you have any difficulty performing the following activities:  Hearing? 0 0  Vision? 0 0  Difficulty concentrating or making decisions? 0 0  Walking or climbing stairs? 0   Dressing or bathing? 0   Doing errands, shopping? 0   Preparing Food and eating ? N   Using the Toilet? N   In the past six months, have you accidently leaked urine? Y   Do you have problems with loss of bowel control? N   Managing your Medications? N   Managing your Finances? N   Housekeeping or managing your Housekeeping? N     Patient Care Team: Delfina Feller, FNP as PCP - General (Nurse Practitioner) Corie Diamond, MD as Consulting Physician (Ophthalmology) Eduardo Grade, MD as Consulting Physician (Dermatology) Claudette Cue, MD (Inactive) as Consulting Physician (Gastroenterology)  I have  updated your Care Teams any recent Medical Services you may have received from other providers in the past year.     Assessment:    This is a routine wellness examination for [facility].  Hearing/Vision screen Hearing Screening - Comments:: Pt denies hearing dif Vision Screening - Comments:: Pt denies vision dif even w/glasses on/ pt last ov about 3 yrs---suggest pt get eyes exam, pt agreed   Goals Addressed               This Visit's Progress     Patient Stated (pt-stated)   On track     I would like to clean out all of my old clothes and shoes in the house       Depression Screen     06/21/2023   12:22 PM 09/03/2022   10:29 AM 03/09/2022   10:54 AM 03/02/2022    2:49 PM 08/27/2021   10:17 AM 02/27/2021    4:22 PM 02/26/2021   10:31 AM  PHQ 2/9 Scores  PHQ - 2 Score 0 0 3 0 0 3 3  PHQ- 9 Score  1 11  0 6 6    Fall Risk     06/21/2023   12:12 PM 09/03/2022   10:29 AM 03/09/2022   10:54 AM 03/02/2022    2:48 PM 08/27/2021   10:17 AM  Fall Risk   Falls in the past year? 0 0 0 0 0  Number falls in past yr: 0   0   Injury with Fall? 0   0   Risk for fall due to : No Fall Risks   No Fall Risks   Follow up Falls evaluation completed   Falls prevention discussed     MEDICARE RISK AT HOME:  Medicare Risk at Home Any stairs in or around the home?: No If so, are there any without handrails?: No Home free of loose throw rugs in walkways, pet beds, electrical cords, etc?: Yes Adequate lighting in your home to reduce risk of falls?: Yes Life alert?: No Use of a cane, walker or w/c?: No  Grab bars in the bathroom?: No Shower chair or bench in shower?: No Elevated toilet seat or a handicapped toilet?: No  TIMED UP AND GO:  Was the test performed?  no  Cognitive Function: 6CIT completed    09/29/2017   11:12 AM 09/27/2016   11:26 AM 08/28/2015   10:45 AM 08/28/2015   10:30 AM 08/23/2014   12:17 PM  MMSE - Mini Mental State Exam  Orientation to time 5 5 5 5 5   Orientation to Place  5 5 5 5 5   Registration 3 3 3 3 3   Attention/ Calculation 5 5 5 5 5   Recall 3 3 2 3 3   Language- name 2 objects 2 2 2 2 2   Language- repeat 1 1 1 1 1   Language- follow 3 step command 3 3 3 3 3   Language- read & follow direction 1 1 1 1 1   Write a sentence 1 1 1 1 1   Copy design 1 1 1 1 1   Total score 30 30 29 30 30         06/21/2023   12:23 PM 03/02/2022    2:50 PM 02/27/2021    3:02 PM 02/26/2020    1:29 PM 10/03/2018   11:24 AM  6CIT Screen  What Year? 0 points 0 points 0 points 0 points 0 points  What month? 0 points 0 points 0 points 0 points 0 points  What time? 0 points 0 points 0 points 0 points 0 points  Count back from 20 0 points 0 points 0 points 2 points 0 points  Months in reverse 0 points 0 points 0 points 0 points 0 points  Repeat phrase 0 points 0 points 2 points 0 points 0 points  Total Score 0 points 0 points 2 points 2 points 0 points    Immunizations Immunization History  Administered Date(s) Administered   Fluad Quad(high Dose 65+) 11/03/2018, 11/15/2019, 12/09/2020, 11/06/2021   Fluad Trivalent(High Dose 65+) 11/22/2022   Influenza, High Dose Seasonal PF 11/04/2016, 11/07/2017   Influenza,inj,Quad PF,6+ Mos 11/22/2012, 11/05/2013, 10/30/2014, 11/04/2015   Moderna SARS-COV2 Booster Vaccination 02/22/2019   Moderna Sars-Covid-2 Vaccination 03/23/2019, 01/01/2020   Pneumococcal Conjugate-13 11/05/2013   Pneumococcal Polysaccharide-23 11/19/2014    Screening Tests Health Maintenance  Topic Date Due   DTaP/Tdap/Td (1 - Tdap) Never done   Zoster Vaccines- Shingrix (1 of 2) Never done   COVID-19 Vaccine (3 - 2024-25 season) 07/06/2024 (Originally 09/19/2022)   INFLUENZA VACCINE  08/19/2023   Medicare Annual Wellness (AWV)  06/20/2024   Pneumonia Vaccine 40+ Years old  Completed   DEXA SCAN  Completed   HPV VACCINES  Aged Out   Meningococcal B Vaccine  Aged Out    Health Maintenance  Health Maintenance Due  Topic Date Due   DTaP/Tdap/Td (1 - Tdap)  Never done   Zoster Vaccines- Shingrix (1 of 2) Never done   Health Maintenance Items Addressed: See Nurse Notes at the end of this note  Additional Screening:  Vision Screening: Recommended annual ophthalmology exams for early detection of glaucoma and other disorders of the eye. Would you like a referral to an eye doctor? No    Dental Screening: Recommended annual dental exams for proper oral hygiene  Community Resource Referral / Chronic Care Management: CRR required this visit?  No   CCM required this visit?  No   Plan:    I have personally reviewed and noted the following in the patient's chart:   Medical and social  history Use of alcohol, tobacco or illicit drugs  Current medications and supplements including opioid prescriptions. Patient is not currently taking opioid prescriptions. Functional ability and status Nutritional status Physical activity Advanced directives List of other physicians Hospitalizations, surgeries, and ER visits in previous 12 months Vitals Screenings to include cognitive, depression, and falls Referrals and appointments  In addition, I have reviewed and discussed with patient certain preventive protocols, quality metrics, and best practice recommendations. A written personalized care plan for preventive services as well as general preventive health recommendations were provided to patient.   Michaelle Adolphus, CMA   06/21/2023   After Visit Summary: (MyChart) Due to this being a telephonic visit, the after visit summary with patients personalized plan was offered to patient via MyChart   Notes: Pt is aware and encouraged to update vaccine shots at her next pcp visit  I have reviewed and agree with the above AWV documentation.   Aneeka-Margaret Gaylyn Keas, FNP

## 2023-06-21 NOTE — Patient Instructions (Signed)
 Laura Acevedo , Thank you for taking time out of your busy schedule to complete your Annual Wellness Visit with me. I enjoyed our conversation and look forward to speaking with you again next year. I, as well as your care team,  appreciate your ongoing commitment to your health goals. Please review the following plan we discussed and let me know if I can assist you in the future. Your Game plan/ To Do List    Follow up Visits: Next Medicare AWV with our clinical staff: 06/21/24 at 12:30p.m.  Next Office Visit with your provider: 09/01/23 at 9:20a.m.  Clinician Recommendations:  Aim for 30 minutes of exercise or brisk walking, 6-8 glasses of water, and 5 servings of fruits and vegetables each day.       This is a list of the screening recommended for you and due dates:  Health Maintenance  Topic Date Due   DTaP/Tdap/Td vaccine (1 - Tdap) Never done   Zoster (Shingles) Vaccine (1 of 2) Never done   COVID-19 Vaccine (3 - 2024-25 season) 07/06/2024*   Flu Shot  08/19/2023   Medicare Annual Wellness Visit  06/20/2024   Pneumonia Vaccine  Completed   DEXA scan (bone density measurement)  Completed   HPV Vaccine  Aged Out   Meningitis B Vaccine  Aged Out  *Topic was postponed. The date shown is not the original due date.    Advanced directives: (In Chart) A copy of your advanced directives are scanned into your chart should your provider ever need it. Advance Care Planning is important because it:  [x]  Makes sure you receive the medical care that is consistent with your values, goals, and preferences  [x]  It provides guidance to your family and loved ones and reduces their decisional burden about whether or not they are making the right decisions based on your wishes.  Follow the link provided in your after visit summary or read over the paperwork we have mailed to you to help you started getting your Advance Directives in place. If you need assistance in completing these, please reach out to us   so that we can help you!  See attachments for Preventive Care and Fall Prevention Tips.

## 2023-08-05 ENCOUNTER — Other Ambulatory Visit: Payer: Self-pay | Admitting: Nurse Practitioner

## 2023-08-05 DIAGNOSIS — E782 Mixed hyperlipidemia: Secondary | ICD-10-CM

## 2023-08-05 DIAGNOSIS — I10 Essential (primary) hypertension: Secondary | ICD-10-CM

## 2023-08-24 ENCOUNTER — Encounter: Payer: Self-pay | Admitting: Nurse Practitioner

## 2023-08-24 ENCOUNTER — Ambulatory Visit: Payer: Self-pay

## 2023-08-24 ENCOUNTER — Ambulatory Visit (INDEPENDENT_AMBULATORY_CARE_PROVIDER_SITE_OTHER): Admitting: Nurse Practitioner

## 2023-08-24 VITALS — BP 142/80 | HR 72 | Temp 98.1°F | Ht 65.0 in | Wt 114.0 lb

## 2023-08-24 DIAGNOSIS — L237 Allergic contact dermatitis due to plants, except food: Secondary | ICD-10-CM | POA: Diagnosis not present

## 2023-08-24 MED ORDER — CALAMINE EX LOTN
1.0000 | TOPICAL_LOTION | CUTANEOUS | 0 refills | Status: AC | PRN
Start: 2023-08-24 — End: ?

## 2023-08-24 MED ORDER — METHYLPREDNISOLONE ACETATE 40 MG/ML IJ SUSP
40.0000 mg | Freq: Once | INTRAMUSCULAR | Status: AC
Start: 2023-08-24 — End: 2023-08-24
  Administered 2023-08-24: 40 mg via INTRAMUSCULAR

## 2023-08-24 MED ORDER — CETIRIZINE HCL 10 MG PO TABS
10.0000 mg | ORAL_TABLET | Freq: Every day | ORAL | 0 refills | Status: AC
Start: 2023-08-24 — End: ?

## 2023-08-24 NOTE — Progress Notes (Signed)
 Acute Office Visit  Subjective:     Patient ID: Laura Acevedo, female    DOB: [birth year], 89 y.o.   MRN: [id number]  Chief Complaint  Patient presents with   [facility] ivy on face and ear started Sunday and getting worse    HPI Laura Acevedo is an 89 year old female who presents on 08/24/2023 for an acute visit with concerns for a rash. The patient reports that on Friday, she was outside working in the yard and pulled poison ivy off a pecan tree and greenhouse. By Sunday, she developed a rash on the right side of her face, which has since spread. She has applied Eucerin cream with minimal relief. No other new exposures or symptoms reported at this time.  Active Ambulatory Problems    Diagnosis Date Noted   Osteoporosis 06/15/2012   Hyperlipidemia 09/20/2012   Essential hypertension 09/20/2012   GAD (generalized anxiety disorder) 05/12/2017   Oropharyngeal dysphagia 09/15/2022   Elevated liver enzymes 09/15/2022   Zenker's diverticulum 10/15/2022   Gastritis and gastroduodenitis 10/15/2022   Allergic dermatitis due to poison ivy 08/24/2023   Contact dermatitis due to poison ivy 08/24/2023   Resolved Ambulatory Problems    Diagnosis Date Noted   No Resolved Ambulatory Problems   Past Medical History:  Diagnosis Date   Cancer (HCC) 1980   Cataract    Colon polyps    Foot neuroma 2005   Hypertension      ROS Constitutional: Denies fever, chills, or malaise. Skin: Reports rash and itching on right side of face. No open wounds noted. HEENT: Denies eye pain, vision changes, nasal congestion, or sore throat. Respiratory: Denies shortness of breath or cough. GI: Denies nausea, vomiting, or diarrhea. Neuro: No dizziness or weakness reported.     Objective:    BP (!) 142/80   Pulse 72   Temp 98.1 F (36.7 C) (Temporal)   Ht 5' 5 (1.651 m)   Wt 114 lb (51.7 kg)   SpO2 98%   BMI 18.97 kg/m  BP Readings from Last 3 Encounters:  08/24/23 (!) 142/80  06/21/23  126/70  03/04/23 126/70   Wt Readings from Last 3 Encounters:  08/24/23 114 lb (51.7 kg)  06/21/23 115 lb (52.2 kg)  03/04/23 115 lb 12.8 oz (52.5 kg)      Physical Exam General: Alert, oriented, in no acute distress. Skin: Erythematous, linear vesicular rash with mild edema on right cheek and jawline consistent with contact dermatitis. No signs of secondary infection. HEENT: Eyes clear without discharge; no oral lesions or swelling. Neuro: Alert and oriented x3; no focal deficits noted. Cardiopulmonary: Heart sounds normal; lungs clear to auscultation.  Pertinent labs & imaging results that were available during my care of the patient were reviewed by me and considered in my medical decision making.  No results found for any visits on 08/24/23.      Assessment & Plan:  Contact dermatitis due to poison ivy -     Cetirizine  HCl; Take 1 tablet (10 mg total) by mouth daily.  Dispense: 30 tablet; Refill: 0 -     methylPREDNISolone  Acetate -     SM Calamine; Apply 1 Application topically as needed for itching.  Dispense: 177 mL; Refill: 0   Laura Acevedo is a 89 year old Caucasian female seen today for contact dermatitis, no acute distress -Administered Prednisolone 40 mg IM in clinic today. -Start Cetirizine  (Zyrtec ) 10 mg orally once daily for itch relief. -Apply calamine lotion  topically to affected areas as needed for soothing effect. - May use cold compress -Monitor for signs of secondary infection increased redness, warmth, pus, fever. Educated patient on avoiding further contact with poison ivy and washing exposed clothing and tools.  The above assessment and management plan was discussed with the patient. The patient verbalized understanding of and has agreed to the management plan. Patient is aware to call the clinic if they develop any new symptoms or if symptoms persist or worsen. Patient is aware when to return to the clinic for a follow-up visit. Patient educated on when it is  appropriate to go to the emergency department.  Return if symptoms worsen or fail to improve.  Ted Goodner [place] Thompson, DNP [facility] [address] [place], [place] [address] [phone]  Note: This document was prepared by [employer] voice dictation technology and any errors that results from this process are unintentional.

## 2023-08-24 NOTE — Telephone Encounter (Unsigned)
 Copied from CRM #[id number]. Topic: Clinical - Red Word Triage >> Aug 24, 2023 10:53 AM Emylou G wrote: [facility] that prompted transfer to Nurse Triage: [facility] ( face is red and super itchy.SABRA getting close to the eye ) worsening.SABRA

## 2023-08-24 NOTE — Patient Instructions (Signed)
-  Calamine lotion 1-application daily, avoid the eye area  - Zyrtec  1-tab daily  - May use cold compress

## 2023-08-24 NOTE — Telephone Encounter (Signed)
 Pt has appt

## 2023-08-24 NOTE — Telephone Encounter (Signed)
 FYI Only or Action Required?: FYI only for provider.  Patient was last seen in primary care on 03/04/2023 by Gladis Mustard, FNP.  Called Nurse Triage reporting No chief complaint on file..  Symptoms began several days ago.  Interventions attempted: Nothing.  Symptoms are: gradually worsening.  Triage Disposition: See HCP Within 4 Hours (Or PCP Triage)  Patient/caregiver understands and will follow disposition?: Yes  Reason for Disposition  [1] Severe poison ivy, oak, or sumac reaction in the past AND [2] face or genitals involved  Answer Assessment - Initial Assessment Questions 1. APPEARANCE of RASH: What does the rash look like?      Red with little pimples  2. LOCATION: Where is the rash located?  (e.g., face, genitals, hands, legs)     Face and ears  3. SIZE: How large is the rash?      Only on face and ears  4. ONSET: When did the rash begin?      Sunday 08/20/23  5. ITCHING: Does the rash itch? If Yes, ask: How bad is it?     Yes  Protocols used: Poison Ivy - Oak - Sumac-A-AH

## 2023-09-01 ENCOUNTER — Ambulatory Visit: Payer: Medicare Other | Admitting: Nurse Practitioner

## 2023-09-01 ENCOUNTER — Encounter: Payer: Self-pay | Admitting: Nurse Practitioner

## 2023-09-01 VITALS — BP 138/74 | HR 71 | Temp 97.8°F | Ht 65.0 in | Wt 114.0 lb

## 2023-09-01 DIAGNOSIS — E782 Mixed hyperlipidemia: Secondary | ICD-10-CM | POA: Diagnosis not present

## 2023-09-01 DIAGNOSIS — I10 Essential (primary) hypertension: Secondary | ICD-10-CM | POA: Diagnosis not present

## 2023-09-01 DIAGNOSIS — Z0000 Encounter for general adult medical examination without abnormal findings: Secondary | ICD-10-CM

## 2023-09-01 DIAGNOSIS — F411 Generalized anxiety disorder: Secondary | ICD-10-CM | POA: Diagnosis not present

## 2023-09-01 DIAGNOSIS — Z0001 Encounter for general adult medical examination with abnormal findings: Secondary | ICD-10-CM | POA: Diagnosis not present

## 2023-09-01 DIAGNOSIS — M810 Age-related osteoporosis without current pathological fracture: Secondary | ICD-10-CM | POA: Diagnosis not present

## 2023-09-01 MED ORDER — ATORVASTATIN CALCIUM 20 MG PO TABS: 20.0000 mg | ORAL_TABLET | Freq: Every day | ORAL | 1 refills | Status: DC

## 2023-09-01 MED ORDER — LISINOPRIL-HYDROCHLOROTHIAZIDE 10-12.5 MG PO TABS: 1.0000 | ORAL_TABLET | Freq: Every day | ORAL | 1 refills | Status: DC

## 2023-09-01 NOTE — Progress Notes (Signed)
 Subjective:    Patient ID: Laura Acevedo, female    DOB: 1932-06-23, 89 y.o.   MRN: [id number]   Chief Complaint: annual physical    HPI:  Laura Acevedo is a 89 y.o. who identifies as a female who was assigned female at birth.   Social history: Lives with: by herself Work history: retired   Water engineer in today for follow up of the following chronic medical issues:  1. Essential hypertension, benign No c/o chest pain, sob or headache. Does not check blood pressure at home. BP Readings from Last 3 Encounters:  08/24/23 (!) 142/80  06/21/23 126/70  03/04/23 126/70     2. Mixed hyperlipidemia Does try to watch diet. Does no dedicated exercise. Lab Results  Component Value Date   CHOL 200 (H) 03/04/2023   HDL 75 03/04/2023   LDLCALC 103 (H) 03/04/2023   TRIG 127 03/04/2023   CHOLHDL 2.7 03/04/2023      3. GAD (generalized anxiety disorder) Is on no medications. 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    09/03/2022   10:29 AM 03/09/2022   10:55 AM 08/27/2021   10:17 AM 02/26/2021   10:31 AM  GAD 7 : Generalized Anxiety Score  Nervous, Anxious, on Edge 0 0 0 1  Control/stop worrying 0 0 0 1  Worry too much - different things 0 0 0 1  Trouble relaxing 0 0 0 1  Restless 0 0 0 0  Easily annoyed or irritable 0 0 0 0  Afraid - awful might happen 0 0 0 0  Total GAD 7 Score 0 0 0 4  Anxiety Difficulty Not difficult at all Not difficult at all Not difficult at all Somewhat difficult      4. Age-related osteoporosis without current pathological fracture Last dexascan was done on 02/19/19. T score was -2.1- refuses to repeat  Wt Readings from Last 3 Encounters:  09/01/23 114 lb (51.7 kg)  08/24/23 114 lb (51.7 kg)  06/21/23 115 lb (52.2 kg)   BMI Readings from Last 3 Encounters:  09/01/23 18.97 kg/m  08/24/23 18.97 kg/m  06/21/23 19.14 kg/m       New complaints: None today  No Known Allergies Outpatient Encounter Medications as of 09/01/2023  Medication Sig   aspirin EC  81 MG tablet Take 81 mg by mouth daily.   atorvastatin  (LIPITOR) 20 MG tablet TAKE 1 TABLET BY MOUTH DAILY   calamine lotion Apply 1 Application topically as needed for itching.   calcium  carbonate (OS-CAL) 600 MG TABS Take 600 mg by mouth daily. AT LUNCH   cetirizine  (ZYRTEC ) 10 MG tablet Take 1 tablet (10 mg total) by mouth daily.   cholecalciferol (VITAMIN D ) 1000 UNITS tablet Take 1,000 Units by mouth daily.    denosumab  (PROLIA ) 60 MG/ML SOLN injection Inject 60 mg into the skin every 6 (six) months. Administer in upper arm, thigh, or abdomen   lisinopril -hydrochlorothiazide  (ZESTORETIC ) 10-12.5 MG tablet TAKE 1 TABLET BY MOUTH DAILY   Multiple Vitamins-Minerals (CENTRUM SILVER PO) Take 1 tablet by mouth every morning.   Multiple Vitamins-Minerals (PRESERVISION AREDS 2 PO) Take by mouth 2 (two) times daily.   No facility-administered encounter medications on file as of 09/01/2023.    Past Surgical History:  Procedure Laterality Date   BIOPSY  10/15/2022   Procedure: BIOPSY;  Surgeon: Cinderella Deatrice FALCON, MD;  Location: [facility];  Service: Endoscopy;;   CATARACT EXTRACTION     ESOPHAGOGASTRODUODENOSCOPY (EGD) WITH PROPOFOL  N/A 10/15/2022  Procedure: ESOPHAGOGASTRODUODENOSCOPY (EGD) WITH PROPOFOL ;  Surgeon: Cinderella Deatrice FALCON, MD;  Location: [facility];  Service: Endoscopy;  Laterality: N/A;  9:15am;asa 3   FOOT NEUROMA SURGERY Left 2005   Dr Roddie   MELANOMA EXCISION Left    leg    Family History  Problem Relation Age of Onset   Diabetes Mother    Heart disease Mother    Heart disease Father    Heart attack Father    Kidney disease Father    Hypertension Sister    Diabetes Brother    Hypertension Brother    Cancer Brother        bladder    Diabetes Brother    Diabetes Brother    Colon cancer Neg Hx       Controlled substance contract: n/a     Review of Systems  Constitutional:  Negative for diaphoresis.  Eyes:  Negative for pain.  Respiratory:  Negative  for shortness of breath.   Cardiovascular:  Negative for chest pain, palpitations and leg swelling.  Gastrointestinal:  Negative for abdominal pain.  Endocrine: Negative for polydipsia.  Skin:  Negative for rash.  Neurological:  Negative for dizziness, weakness and headaches.  Hematological:  Does not bruise/bleed easily.  All other systems reviewed and are negative.      Objective:   Physical Exam Vitals and nursing note reviewed.  Constitutional:      General: She is not in acute distress.    Appearance: Normal appearance. She is well-developed.  HENT:     Head: Normocephalic.     Right Ear: Tympanic membrane normal.     Left Ear: Tympanic membrane normal.     Nose: Nose normal.     Mouth/Throat:     Mouth: Mucous membranes are moist.  Eyes:     Pupils: Pupils are equal, round, and reactive to light.  Neck:     Vascular: No carotid bruit or JVD.  Cardiovascular:     Rate and Rhythm: Normal rate and regular rhythm.     Heart sounds: Normal heart sounds.  Pulmonary:     Effort: Pulmonary effort is normal. No respiratory distress.     Breath sounds: Normal breath sounds. No wheezing or rales.  Chest:     Chest wall: No tenderness.  Abdominal:     General: Bowel sounds are normal. There is no distension or abdominal bruit.     Palpations: Abdomen is soft. There is no hepatomegaly, splenomegaly, mass or pulsatile mass.     Tenderness: There is no abdominal tenderness.  Musculoskeletal:        General: Normal range of motion.     Cervical back: Normal range of motion and neck supple.  Lymphadenopathy:     Cervical: No cervical adenopathy.  Skin:    General: Skin is warm and dry.  Neurological:     Mental Status: She is alert and oriented to person, place, and time.     Deep Tendon Reflexes: Reflexes are normal and symmetric.  Psychiatric:        Behavior: Behavior normal.        Thought Content: Thought content normal.        Judgment: Judgment normal.     BP  138/74   Pulse 71   Temp 97.8 F (36.6 C) (Temporal)   Ht 5' 5 (1.651 m)   Wt 114 lb (51.7 kg)   SpO2 97%   BMI 18.97 kg/m  Assessment & Plan:   Laura Acevedo comes in today with chief complaint of annual physical  Diagnosis and orders addressed:  1. Essential hypertension, benign Low sodium diet - lisinopril -hydrochlorothiazide  (ZESTORETIC ) 10-12.5 MG tablet; Take 1 tablet by mouth daily.  Dispense: 100 tablet; Refill: 2 - CBC with Differential/Platelet - CMP14+EGFR  2. Mixed hyperlipidemia Low fat diet - atorvastatin  (LIPITOR) 20 MG tablet; Take 1 tablet (20 mg total) by mouth daily.  Dispense: 100 tablet; Refill: 2 - Lipid panel  3. GAD (generalized anxiety disorder) Stress management  4. Age-related osteoporosis without current pathological fracture Weight bearing exercise.   Labs pending Health Maintenance reviewed Diet and exercise encouraged  Follow up plan: 6 months   Nadiyah-Margaret Gladis, FNP

## 2023-09-01 NOTE — Patient Instructions (Signed)
 Fall Prevention in the Home, Adult Falls can cause injuries and can happen to people of all ages. There are many things you can do to make your home safer and to help prevent falls. What actions can I take to prevent falls? General information Use good lighting in all rooms. Make sure to: Replace any light bulbs that burn out. Turn on the lights in dark areas and use night-lights. Keep items that you use often in easy-to-reach places. Lower the shelves around your home if needed. Move furniture so that there are clear paths around it. Do not use throw rugs or other things on the floor that can make you trip. If any of your floors are uneven, fix them. Add color or contrast paint or tape to clearly mark and help you see: Grab bars or handrails. First and last steps of staircases. Where the edge of each step is. If you use a ladder or stepladder: Make sure that it is fully opened. Do not climb a closed ladder. Make sure the sides of the ladder are locked in place. Have someone hold the ladder while you use it. Know where your pets are as you move through your home. What can I do in the bathroom?     Keep the floor dry. Clean up any water on the floor right away. Remove soap buildup in the bathtub or shower. Buildup makes bathtubs and showers slippery. Use non-skid mats or decals on the floor of the bathtub or shower. Attach bath mats securely with double-sided, non-slip rug tape. If you need to sit down in the shower, use a non-slip stool. Install grab bars by the toilet and in the bathtub and shower. Do not use towel bars as grab bars. What can I do in the bedroom? Make sure that you have a light by your bed that is easy to reach. Do not use any sheets or blankets on your bed that hang to the floor. Have a firm chair or bench with side arms that you can use for support when you get dressed. What can I do in the kitchen? Clean up any spills right away. If you need to reach something  above you, use a step stool with a grab bar. Keep electrical cords out of the way. Do not use floor polish or wax that makes floors slippery. What can I do with my stairs? Do not leave anything on the stairs. Make sure that you have a light switch at the top and the bottom of the stairs. Make sure that there are handrails on both sides of the stairs. Fix handrails that are broken or loose. Install non-slip stair treads on all your stairs if they do not have carpet. Avoid having throw rugs at the top or bottom of the stairs. Choose a carpet that does not hide the edge of the steps on the stairs. Make sure that the carpet is firmly attached to the stairs. Fix carpet that is loose or worn. What can I do on the outside of my home? Use bright outdoor lighting. Fix the edges of walkways and driveways and fix any cracks. Clear paths of anything that can make you trip, such as tools or rocks. Add color or contrast paint or tape to clearly mark and help you see anything that might make you trip as you walk through a door, such as a raised step or threshold. Trim any bushes or trees on paths to your home. Check to see if handrails are loose  or broken and that both sides of all steps have handrails. Install guardrails along the edges of any raised decks and porches. Have leaves, snow, or ice cleared regularly. Use sand, salt, or ice melter on paths if you live where there is ice and snow during the winter. Clean up any spills in your garage right away. This includes grease or oil spills. What other actions can I take? Review your medicines with your doctor. Some medicines can cause dizziness or changes in blood pressure, which increase your risk of falling. Wear shoes that: Have a low heel. Do not wear high heels. Have rubber bottoms and are closed at the toe. Feel good on your feet and fit well. Use tools that help you move around if needed. These include: Canes. Walkers. Scooters. Crutches. Ask  your doctor what else you can do to help prevent falls. This may include seeing a physical therapist to learn to do exercises to move better and get stronger. Where to find more information Centers for Disease Control and Prevention, STEADI: TonerPromos.no [employer] on Aging: BaseRingTones.pl National Institute on Aging: BaseRingTones.pl Contact a doctor if: You are afraid of falling at home. You feel weak, drowsy, or dizzy at home. You fall at home. Get help right away if you: Lose consciousness or have trouble moving after a fall. Have a fall that causes a head injury. These symptoms may be an emergency. Get help right away. Call 911. Do not wait to see if the symptoms will go away. Do not drive yourself to the hospital. This information is not intended to replace advice given to you by your health care provider. Make sure you discuss any questions you have with your health care provider. Document Revised: 09/07/2021 Document Reviewed: 09/07/2021 Elsevier Patient Education  2024 [employer].

## 2023-09-02 ENCOUNTER — Ambulatory Visit: Payer: Self-pay | Admitting: Nurse Practitioner

## 2023-09-02 LAB — CBC WITH DIFFERENTIAL/PLATELET
Basophils Absolute: 0 x10E3/uL (ref 0.0–0.2)
Basos: 1 %
EOS (ABSOLUTE): 0.1 x10E3/uL (ref 0.0–0.4)
Eos: 1 %
Hematocrit: 43.1 % (ref 34.0–46.6)
Hemoglobin: 14.3 g/dL (ref 11.1–15.9)
Immature Grans (Abs): 0 x10E3/uL (ref 0.0–0.1)
Immature Granulocytes: 0 %
Lymphocytes Absolute: 1.4 x10E3/uL (ref 0.7–3.1)
Lymphs: 26 %
MCH: 32.6 pg (ref 26.6–33.0)
MCHC: 33.2 g/dL (ref 31.5–35.7)
MCV: 98 fL — ABNORMAL HIGH (ref 79–97)
Monocytes Absolute: 0.6 x10E3/uL (ref 0.1–0.9)
Monocytes: 11 %
Neutrophils Absolute: 3.3 x10E3/uL (ref 1.4–7.0)
Neutrophils: 61 %
Platelets: 255 x10E3/uL (ref 150–450)
RBC: 4.38 x10E6/uL (ref 3.77–5.28)
RDW: 11.8 % (ref 11.7–15.4)
WBC: 5.4 x10E3/uL (ref 3.4–10.8)

## 2023-09-02 LAB — LIPID PANEL
Chol/HDL Ratio: 2.1 ratio (ref 0.0–4.4)
Cholesterol, Total: 199 mg/dL (ref 100–199)
HDL: 96 mg/dL (ref >39)
LDL Chol Calc (NIH): 85 mg/dL (ref 0–99)
Triglycerides: 104 mg/dL (ref 0–149)
VLDL Cholesterol Cal: 18 mg/dL (ref 5–40)

## 2023-09-02 LAB — CMP14+EGFR
ALT: 42 IU/L — ABNORMAL HIGH (ref 0–32)
AST: 27 IU/L (ref 0–40)
Albumin: 4.3 g/dL (ref 3.7–4.7)
Alkaline Phosphatase: 62 IU/L (ref 44–121)
BUN/Creatinine Ratio: 22 (ref 12–28)
BUN: 26 mg/dL (ref 8–27)
Bilirubin Total: 0.8 mg/dL (ref 0.0–1.2)
CO2: 24 mmol/L (ref 20–29)
Calcium: 10.4 mg/dL — ABNORMAL HIGH (ref 8.7–10.3)
Chloride: 99 mmol/L (ref 96–106)
Creatinine, Ser: 1.18 mg/dL — ABNORMAL HIGH (ref 0.57–1.00)
Globulin, Total: 2.3 g/dL (ref 1.5–4.5)
Glucose: 87 mg/dL (ref 70–99)
Potassium: 4.9 mmol/L (ref 3.5–5.2)
Sodium: 140 mmol/L (ref 134–144)
Total Protein: 6.6 g/dL (ref 6.0–8.5)
eGFR: 44 mL/min/1.73 — ABNORMAL LOW (ref >59)

## 2023-09-08 DIAGNOSIS — L84 Corns and callosities: Secondary | ICD-10-CM | POA: Diagnosis not present

## 2023-09-08 DIAGNOSIS — B351 Tinea unguium: Secondary | ICD-10-CM | POA: Diagnosis not present

## 2023-09-08 DIAGNOSIS — I70203 Unspecified atherosclerosis of native arteries of extremities, bilateral legs: Secondary | ICD-10-CM | POA: Diagnosis not present

## 2023-09-08 DIAGNOSIS — M79674 Pain in right toe(s): Secondary | ICD-10-CM | POA: Diagnosis not present

## 2023-09-08 DIAGNOSIS — M79675 Pain in left toe(s): Secondary | ICD-10-CM | POA: Diagnosis not present

## 2023-10-05 ENCOUNTER — Telehealth: Payer: Self-pay

## 2023-10-05 DIAGNOSIS — M810 Age-related osteoporosis without current pathological fracture: Secondary | ICD-10-CM

## 2023-10-05 MED ORDER — DENOSUMAB 60 MG/ML ~~LOC~~ SOSY
60.0000 mg | PREFILLED_SYRINGE | Freq: Once | SUBCUTANEOUS | Status: AC
Start: 2023-10-05 — End: ?

## 2023-10-05 NOTE — Telephone Encounter (Signed)
 Sent for VOB.

## 2023-10-06 DIAGNOSIS — X32XXXD Exposure to sunlight, subsequent encounter: Secondary | ICD-10-CM | POA: Diagnosis not present

## 2023-10-06 DIAGNOSIS — L570 Actinic keratosis: Secondary | ICD-10-CM | POA: Diagnosis not present

## 2023-10-06 DIAGNOSIS — D225 Melanocytic nevi of trunk: Secondary | ICD-10-CM | POA: Diagnosis not present

## 2023-10-06 DIAGNOSIS — Z08 Encounter for follow-up examination after completed treatment for malignant neoplasm: Secondary | ICD-10-CM | POA: Diagnosis not present

## 2023-10-06 DIAGNOSIS — Z1283 Encounter for screening for malignant neoplasm of skin: Secondary | ICD-10-CM | POA: Diagnosis not present

## 2023-10-06 DIAGNOSIS — Z85820 Personal history of malignant melanoma of skin: Secondary | ICD-10-CM | POA: Diagnosis not present

## 2023-10-19 ENCOUNTER — Telehealth: Payer: Self-pay

## 2023-10-19 ENCOUNTER — Other Ambulatory Visit (HOSPITAL_COMMUNITY): Payer: Self-pay

## 2023-10-19 NOTE — Telephone Encounter (Signed)
 Laura Acevedo

## 2023-10-19 NOTE — Telephone Encounter (Signed)
 Prolia VOB initiated via [email or web address]  Next Prolia inj DUE: 01/29/23

## 2023-10-19 NOTE — Telephone Encounter (Signed)
 Pt ready for scheduling for PROLIA  on or after : 11/19/23  Option# 1: Buy/Bill (Office supplied medication)  Out-of-pocket cost due at time of clinic visit: $362  Number of injection/visits approved: 2  Primary: UHC-MEDICARE Prolia  co-insurance: 20% Admin fee co-insurance: $30  Secondary: --- Prolia  co-insurance:  Admin fee co-insurance:   Medical Benefit Details: Date Benefits were checked: 10/19/23 Deductible: NO/ Coinsurance: 20%/ Admin Fee: $30  Prior Auth: APPROVED PA# [id number] Expiration Date: 11/19/23-11/18/24  # of doses approved: 2 ----------------------------------------------------------------------- Option# 2- Med Obtained from pharmacy: Prolia  is no longer preferred for pharmacy benefit. Jub[facility]i is now preferred. PRICING IS FOR JUBBONTI  Pharmacy benefit: Copay [phone] (Paid to pharmacy) Admin Fee: $30 (Pay at clinic)  Prior Auth: N/A PA# Expiration Date:   # of doses approved:   If patient wants fill through the pharmacy benefit please send prescription to: [facility], and include estimated need by date in rx notes. Pharmacy will ship medication directly to the office.  Patient NOT eligible for Prolia  Copay Card. Copay Card can make patient's cost as little as $25. Link to apply: https://www.amgensupportplus.com/copay  ** This summary of benefits is an estimation of the patient's out-of-pocket cost. Exact cost may very based on individual plan coverage.

## 2023-11-28 ENCOUNTER — Ambulatory Visit: Payer: Self-pay

## 2023-11-29 ENCOUNTER — Telehealth: Payer: Self-pay

## 2023-11-29 NOTE — Telephone Encounter (Signed)
 Copied from CRM [id number]. Topic: Appointments - Appointment Cancel/Reschedule >> Nov 25, 2023  9:36 AM Marda G wrote: Please call patient Laura Acevedo she would like to reschedule her Prolia  Shot.

## 2023-11-29 NOTE — Telephone Encounter (Signed)
 Patient was called on 11/25/2023 and appointment was rescheduled to 12/02/2023

## 2023-12-02 ENCOUNTER — Ambulatory Visit: Admitting: *Deleted

## 2023-12-02 DIAGNOSIS — M810 Age-related osteoporosis without current pathological fracture: Secondary | ICD-10-CM | POA: Diagnosis not present

## 2023-12-02 MED ORDER — DENOSUMAB 60 MG/ML ~~LOC~~ SOSY
60.0000 mg | PREFILLED_SYRINGE | Freq: Once | SUBCUTANEOUS | Status: AC
Start: 2023-12-02 — End: 2023-12-02
  Administered 2023-12-02: 60 mg via SUBCUTANEOUS

## 2023-12-02 NOTE — Progress Notes (Signed)
 Patient is in office today for a nurse visit for Prolia  injection. Injection was given in the  Left arm. Patient tolerated injection well. subcutaneous

## 2023-12-13 ENCOUNTER — Ambulatory Visit (INDEPENDENT_AMBULATORY_CARE_PROVIDER_SITE_OTHER)

## 2023-12-13 DIAGNOSIS — Z23 Encounter for immunization: Secondary | ICD-10-CM

## 2024-01-15 ENCOUNTER — Other Ambulatory Visit: Payer: Self-pay | Admitting: Nurse Practitioner

## 2024-01-15 DIAGNOSIS — E782 Mixed hyperlipidemia: Secondary | ICD-10-CM

## 2024-01-15 DIAGNOSIS — I10 Essential (primary) hypertension: Secondary | ICD-10-CM

## 2024-03-05 ENCOUNTER — Ambulatory Visit: Payer: Self-pay | Admitting: Nurse Practitioner
